# Patient Record
Sex: Female | Born: 1950
Health system: Southern US, Community
[De-identification: ages and names within clinical notes are randomized; demographics above are authoritative.]

## PROBLEM LIST (undated history)

## (undated) DIAGNOSIS — K5792 Diverticulitis of intestine, part unspecified, without perforation or abscess without bleeding: Secondary | ICD-10-CM

## (undated) DIAGNOSIS — Z720 Tobacco use: Secondary | ICD-10-CM

## (undated) DIAGNOSIS — I10 Essential (primary) hypertension: Secondary | ICD-10-CM

## (undated) DIAGNOSIS — K219 Gastro-esophageal reflux disease without esophagitis: Secondary | ICD-10-CM

## (undated) DIAGNOSIS — K759 Inflammatory liver disease, unspecified: Secondary | ICD-10-CM

## (undated) DIAGNOSIS — G43909 Migraine, unspecified, not intractable, without status migrainosus: Secondary | ICD-10-CM

## (undated) DIAGNOSIS — I739 Peripheral vascular disease, unspecified: Secondary | ICD-10-CM

## (undated) DIAGNOSIS — M47812 Spondylosis without myelopathy or radiculopathy, cervical region: Secondary | ICD-10-CM

## (undated) DIAGNOSIS — I679 Cerebrovascular disease, unspecified: Secondary | ICD-10-CM

## (undated) DIAGNOSIS — I2510 Atherosclerotic heart disease of native coronary artery without angina pectoris: Secondary | ICD-10-CM

## (undated) DIAGNOSIS — A048 Other specified bacterial intestinal infections: Secondary | ICD-10-CM

## (undated) DIAGNOSIS — B029 Zoster without complications: Secondary | ICD-10-CM

## (undated) HISTORY — DX: Migraine, unspecified, not intractable, without status migrainosus: G43.909

## (undated) HISTORY — PX: CAROTID ENDARTERECTOMY: SUR193

## (undated) HISTORY — DX: Peripheral vascular disease, unspecified: I73.9

## (undated) HISTORY — DX: Gastro-esophageal reflux disease without esophagitis: K21.9

## (undated) HISTORY — PX: ABDOMINAL HYSTERECTOMY: SHX81

## (undated) HISTORY — PX: HEMORRHOID SURGERY: SHX153

## (undated) HISTORY — DX: Other specified bacterial intestinal infections: A04.8

## (undated) HISTORY — DX: Inflammatory liver disease, unspecified: K75.9

## (undated) HISTORY — DX: Essential (primary) hypertension: I10

## (undated) HISTORY — DX: Zoster without complications: B02.9

## (undated) HISTORY — PX: CHOLECYSTECTOMY: SHX55

## (undated) HISTORY — DX: Cerebrovascular disease, unspecified: I67.9

## (undated) HISTORY — DX: Tobacco use: Z72.0

## (undated) HISTORY — PX: COLONOSCOPY: SHX174

## (undated) HISTORY — DX: Atherosclerotic heart disease of native coronary artery without angina pectoris: I25.10

## (undated) HISTORY — DX: Spondylosis without myelopathy or radiculopathy, cervical region: M47.812

## (undated) HISTORY — DX: Diverticulitis of intestine, part unspecified, without perforation or abscess without bleeding: K57.92

---

## 1998-11-30 ENCOUNTER — Ambulatory Visit (HOSPITAL_COMMUNITY): Admission: RE | Admit: 1998-11-30 | Discharge: 1998-12-01 | Payer: Self-pay | Admitting: *Deleted

## 1999-06-19 ENCOUNTER — Encounter: Admission: RE | Admit: 1999-06-19 | Discharge: 1999-06-19 | Payer: Self-pay | Admitting: Family Medicine

## 1999-06-19 ENCOUNTER — Encounter: Payer: Self-pay | Admitting: Family Medicine

## 2000-09-18 ENCOUNTER — Other Ambulatory Visit: Admission: RE | Admit: 2000-09-18 | Discharge: 2000-09-18 | Payer: Self-pay | Admitting: Obstetrics and Gynecology

## 2000-12-30 ENCOUNTER — Encounter (INDEPENDENT_AMBULATORY_CARE_PROVIDER_SITE_OTHER): Payer: Self-pay | Admitting: *Deleted

## 2000-12-30 ENCOUNTER — Ambulatory Visit (HOSPITAL_BASED_OUTPATIENT_CLINIC_OR_DEPARTMENT_OTHER): Admission: RE | Admit: 2000-12-30 | Discharge: 2000-12-30 | Payer: Self-pay | Admitting: *Deleted

## 2002-05-26 ENCOUNTER — Emergency Department (HOSPITAL_COMMUNITY): Admission: EM | Admit: 2002-05-26 | Discharge: 2002-05-26 | Payer: Self-pay | Admitting: *Deleted

## 2002-05-26 ENCOUNTER — Encounter: Payer: Self-pay | Admitting: *Deleted

## 2002-06-10 ENCOUNTER — Ambulatory Visit (HOSPITAL_COMMUNITY): Admission: RE | Admit: 2002-06-10 | Discharge: 2002-06-10 | Payer: Self-pay | Admitting: Family Medicine

## 2002-06-10 ENCOUNTER — Encounter: Payer: Self-pay | Admitting: Family Medicine

## 2003-02-21 ENCOUNTER — Ambulatory Visit (HOSPITAL_COMMUNITY): Admission: RE | Admit: 2003-02-21 | Discharge: 2003-02-21 | Payer: Self-pay | Admitting: Neurology

## 2003-03-29 ENCOUNTER — Encounter (INDEPENDENT_AMBULATORY_CARE_PROVIDER_SITE_OTHER): Payer: Self-pay | Admitting: Specialist

## 2003-03-29 ENCOUNTER — Inpatient Hospital Stay (HOSPITAL_COMMUNITY): Admission: RE | Admit: 2003-03-29 | Discharge: 2003-03-30 | Payer: Self-pay | Admitting: Vascular Surgery

## 2004-02-04 HISTORY — PX: CORONARY ARTERY BYPASS GRAFT: SHX141

## 2004-06-03 ENCOUNTER — Inpatient Hospital Stay (HOSPITAL_COMMUNITY)
Admission: AD | Admit: 2004-06-03 | Discharge: 2004-06-08 | Payer: Self-pay | Admitting: Thoracic Surgery (Cardiothoracic Vascular Surgery)

## 2004-06-04 ENCOUNTER — Encounter: Payer: Self-pay | Admitting: Cardiovascular Disease

## 2004-06-27 ENCOUNTER — Encounter
Admission: RE | Admit: 2004-06-27 | Discharge: 2004-06-27 | Payer: Self-pay | Admitting: Thoracic Surgery (Cardiothoracic Vascular Surgery)

## 2004-11-21 ENCOUNTER — Inpatient Hospital Stay (HOSPITAL_COMMUNITY): Admission: AD | Admit: 2004-11-21 | Discharge: 2004-11-22 | Payer: Self-pay | Admitting: Cardiovascular Disease

## 2008-11-21 ENCOUNTER — Encounter: Payer: Self-pay | Admitting: Gastroenterology

## 2008-11-24 ENCOUNTER — Encounter: Payer: Self-pay | Admitting: Gastroenterology

## 2009-03-21 ENCOUNTER — Ambulatory Visit: Payer: Self-pay | Admitting: Gastroenterology

## 2009-03-22 ENCOUNTER — Ambulatory Visit: Payer: Self-pay | Admitting: Gastroenterology

## 2009-03-26 ENCOUNTER — Encounter: Payer: Self-pay | Admitting: Gastroenterology

## 2009-04-04 ENCOUNTER — Telehealth: Payer: Self-pay | Admitting: Gastroenterology

## 2009-10-18 ENCOUNTER — Ambulatory Visit: Payer: Self-pay | Admitting: Cardiology

## 2009-10-18 ENCOUNTER — Encounter (INDEPENDENT_AMBULATORY_CARE_PROVIDER_SITE_OTHER): Payer: Self-pay | Admitting: *Deleted

## 2009-10-18 DIAGNOSIS — I252 Old myocardial infarction: Secondary | ICD-10-CM

## 2009-10-18 LAB — CONVERTED CEMR LAB
AST: 14 units/L
BUN: 23 mg/dL
Creatinine, Ser: 1 mg/dL
GFR calc non Af Amer: >60 mL/min
Glomerular Filtration Rate, Af Am: 57 mL/min/{1.73_m2}
HCT: 41.3 %
Platelets: 264 10*3/uL
Potassium: 4.1 meq/L
Sodium: 140 meq/L

## 2009-10-19 ENCOUNTER — Encounter: Payer: Self-pay | Admitting: Cardiology

## 2009-10-19 DIAGNOSIS — F17200 Nicotine dependence, unspecified, uncomplicated: Secondary | ICD-10-CM | POA: Insufficient documentation

## 2009-10-19 DIAGNOSIS — E785 Hyperlipidemia, unspecified: Secondary | ICD-10-CM | POA: Insufficient documentation

## 2009-10-19 DIAGNOSIS — K219 Gastro-esophageal reflux disease without esophagitis: Secondary | ICD-10-CM

## 2009-10-19 DIAGNOSIS — K5730 Diverticulosis of large intestine without perforation or abscess without bleeding: Secondary | ICD-10-CM | POA: Insufficient documentation

## 2009-10-24 ENCOUNTER — Encounter: Payer: Self-pay | Admitting: Cardiology

## 2009-11-30 DIAGNOSIS — I739 Peripheral vascular disease, unspecified: Secondary | ICD-10-CM

## 2009-11-30 DIAGNOSIS — I679 Cerebrovascular disease, unspecified: Secondary | ICD-10-CM

## 2009-12-04 ENCOUNTER — Encounter: Payer: Self-pay | Admitting: Cardiology

## 2010-01-03 ENCOUNTER — Encounter: Admission: RE | Admit: 2010-01-03 | Discharge: 2010-01-03 | Payer: Self-pay | Admitting: Interventional Cardiology

## 2010-02-14 ENCOUNTER — Ambulatory Visit (HOSPITAL_COMMUNITY)
Admission: RE | Admit: 2010-02-14 | Discharge: 2010-02-14 | Payer: Self-pay | Source: Home / Self Care | Attending: Interventional Cardiology | Admitting: Interventional Cardiology

## 2010-02-24 ENCOUNTER — Encounter: Payer: Self-pay | Admitting: Thoracic Surgery (Cardiothoracic Vascular Surgery)

## 2010-02-24 ENCOUNTER — Encounter: Payer: Self-pay | Admitting: Family Medicine

## 2010-03-05 ENCOUNTER — Ambulatory Visit
Admission: RE | Admit: 2010-03-05 | Discharge: 2010-03-05 | Payer: Self-pay | Source: Home / Self Care | Attending: Vascular Surgery | Admitting: Vascular Surgery

## 2010-03-06 NOTE — Consult Note (Addendum)
NEW PATIENT CONSULTATION  [name], [name] DOB:  28-Nov-1950                                       03/05/2010 [id number]#:[id number]  This patient presents today for evaluation of right leg claudication. She is a 59-year white female with extensive past history of a cardiac and cerebrovascular occlusive disease.  She is status post coronary bypass grafting in 2006 and underwent a right carotid endarterectomy in approximately 2004.  She has had progressive bilateral lower extremity claudication symptoms and underwent CT angiogram on 01/03/2010 followed by a formal arteriogram by Dr. Eldridge Dace to follow.  I do have these films for review.  The arteriogram revealed a significant stenosis in the left common iliac artery and she had successful stenting of this by Dr. Eldridge Dace.  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 The dominant run-off vessel was the peroneal artery on the right.  She has some knee discomfort with walking as well but the majority of symptoms appear to be related to the right calf claudication.  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  She reports this very difficult for her to do her activities at work due to the calf pain.  PAST HISTORY:  Significant hypertension, elevated cholesterol.  She has a history of cholecystectomy and hysterectomy.  SOCIAL HISTORY:  She is single with no children.  She has dropped down to the approximately 4 cigarettes per day from a heavy habit in the past.  She does not drink alcohol.  FAMILY HISTORY:  Significant for myocardial infarction in her father at age 31 and TIA in her sister.  REVIEW OF SYSTEMS:  Positive for weight gain, she is up to 222 pounds. She is 5 feet 9 inches. VASCULAR:  Pain in the legs with walking. CARDIAC:  Positive for chest pain  in the past. GI:  Positive for reflux. NEUROLOGIC:  Positive for headaches. PULMONARY, HEMATOLOGIC, GU:  Negative. HEENT:  Sore throat. MUSCULOSKELETAL:  Joint pain and muscle pain. PSYCHIATRIC:  Anxiety.  PHYSICAL EXAMINATION:  Well-developed, moderately obese white female in no acute distress.  Blood pressure 171/92, pulse 98, respirations 22. HEENT:  Normal.  Chest:  Clear bilaterally without  rales, rhonchi or wheezes.  Heart:  Regular rate and rhythm.  I do not hear any murmurs. She has 2+ radial, 2+ femoral pulses bilaterally.  She has a faint left popliteal pulse.  I do not palpate pedal pulses on the left.  On the right, she has absent popliteal and distal pulses.  Musculoskeletal: She has no major deformity or cyanosis.  Neurologic:  No focal weakness or paresthesias.  Skin:  Without ulcers or rashes.  Carotid arteries without bruits.  She does have a well-healed right neck incision.  I have reviewed her films with the patient.  I explained that she has had the prior vein harvest from her right leg and apparently had an exploration of the left vein.  It was felt not to be adequate for bypass.  I have explained that her current situation is not limb threatening and explained one option would be continued observation. She reports that she is unable to tolerate this degree of claudication and it is making it very difficult for her to do her  job.  I discussed the option of right femoral to popliteal bypass for relief of her ischemic symptoms.  I explained that this would be done with Gore-Tex prosthetic graft which should have excellent patency in the above-knee position approaching that of vein at 70% 5-year patency.  She understands the procedure and expected out of work time following this. She wishes to proceed.  We have scheduled this at her convenience for 03/15/2010 at [facility]    Larina Earthly, M.D. Electronically Signed  TFE/MEDQ  D:  03/05/2010  T:   03/06/2010  Job:  [id number]  cc:   Lyn Records, M.D. Corky Crafts, MD

## 2010-03-07 NOTE — Procedures (Signed)
Summary: Upper Endoscopy  Patient: [name] Note: All result statuses are Final unless otherwise noted.  Tests: (1) Upper Endoscopy (EGD)   EGD Upper Endoscopy       DONE     [facility]     [address]     [place], [place]  [address]           ENDOSCOPY PROCEDURE REPORT           PATIENT:  Misty, Yu  MR#:  [id number]     BIRTHDATE:  1950-10-24, 58 yrs. old  GENDER:  female           ENDOSCOPIST:  Barbette Hair. Arlyce Dice, MD     Referred by:           PROCEDURE DATE:  03/22/2009     PROCEDURE:  EGD, diagnostic     ASA CLASS:  Class II     INDICATIONS:  GERD           MEDICATIONS:   There was residual sedation effect present from     prior procedure., Versed 1 mg IV, glycopyrrolate (Robinal) 0.2 mg     IV, 0.6cc simethancone 0.6 cc PO     TOPICAL ANESTHETIC:  Exactacain Spray           DESCRIPTION OF PROCEDURE:   After the risks benefits and     alternatives of the procedure were thoroughly explained, informed     consent was obtained.  The LB GIF-H180 [id number] endoscope was     introduced through the mouth and advanced to the third portion of     the duodenum, without limitations.  The instrument was slowly     withdrawn as the mucosa was fully examined.     <<PROCEDUREIMAGES>>           The upper, middle, and distal third of the esophagus were     carefully inspected and no abnormalities were noted. The z-line     was well seen at the GEJ. The endoscope was pushed into the fundus     which was normal including a retroflexed view. The antrum,gastric     body, first and second part of the duodenum were unremarkable (see     image1, image2, and image3).    Retroflexed views revealed no     abnormalities.    The scope was then withdrawn from the patient     and the procedure completed.           COMPLICATIONS:  None           ENDOSCOPIC IMPRESSION:     1) Normal EGD     RECOMMENDATIONS:     1) continue PPI - dexilant 60mg  daily     2) Call office next 2-3 days  to schedule an office appointment     for 1 month           REPEAT EXAM:  No           ______________________________     Barbette Hair. Arlyce Dice, MD           CC:  Patrica Duel, MD           n.     Rosalie Doctor:   Barbette Hair. Diar Berkel at 03/22/2009 03:06 PM           Lindie Spruce, [phone]  Note: An exclamation mark (!) indicates a result that was not dispersed into the flowsheet. Document Creation Date: 03/22/2009 3:06  PM _______________________________________________________________________  (1) Order result status: Final Collection or observation date-time: 03/22/2009 14:57 Requested date-time:  Receipt date-time:  Reported date-time:  Referring Physician:   Ordering Physician: Melvia Heaps [phone]) Specimen Source:  Source: Launa Grill Order Number: [id number] Lab site:

## 2010-03-07 NOTE — Assessment & Plan Note (Signed)
Summary: + hpylori, severe nausea...em   History of Present Illness Visit Type: Initial Consult Primary GI MD: Melvia Heaps MD [facility] Primary Provider: Earma Reading, PA Requesting Provider: Patrica Duel, MD Chief Complaint: Patient complains of nausea and is pos for H pylori. She currently has three days of treatment left for her h pylori. She is having some diarrhea and rectal bleeding which she comtributes to her hemorrhoids.  History of Present Illness:   [name] is a 60 year old white female referred at the request of Dr. Nobie Putnam for evaluation of nausea.  Nausea has been a problem for years.  It is intermittent and may occur in waves.  It is occasionally worsened with eating.  She has a long history of severe pyrosis.  She often has to break through symptoms despite taking Prilosec once or even twice a day.  She denies dysphagia, coughing or hoarseness.   She has been taking Prilosec for years.  She was recently placed on a Prevpac because of blood work showing H pylori antibody.  She recently underwent a CT of the abdomen and pelvis fall undergoing go for hematuria.  and indeterminant 13 mm ovoid density was seen in the liver.  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   GI Review of Systems    Reports abdominal pain, acid reflux, belching, bloating, heartburn, loss of appetite, nausea, vomiting, and  weight loss.     Location of  Abdominal pain: epigastric area. Weight loss of 7 pounds over 5 days.   Denies chest pain, dysphagia with liquids, dysphagia with solids, vomiting blood, and  weight gain.      Reports diarrhea, hemorrhoids, and  rectal bleeding.     Denies anal fissure, black tarry stools, change in bowel habit, constipation, diverticulosis, fecal incontinence, heme positive stool, irritable bowel syndrome, jaundice, light color stool, liver problems, and   rectal pain. Preventive Screening-Counseling & Management  Alcohol-Tobacco     Smoking Status: current      Drug Use:  no.      Current Medications (verified): 1)  Tribenzor (Unknown Dose) .... Take One By Mouth Once Daily 2)  Aspirin 325 Mg Tabs (Aspirin) .... Takw Two By Mouth Once Daily 3)  Prilosec Otc 20 Mg Tbec (Omeprazole Magnesium) .... Take One- Two Tabs By Mouth Once Daily 4)  Multivitamins  Tabs (Multiple Vitamin) .... Take One By Mouth Once Daily 5)  Coenzyme Q10 100 Mg Caps (Coenzyme Q10) .... Take One By Mouth Once Daily 6)  Prevpac  Misc (Amoxicill-Clarithro-Lansopraz) .... Take As Directed 7)  Potassium .... Take One  By Mouth Once Daily  Allergies (verified): 1)  ! Codeine  Past History:  Past Medical History: Hypertension h pylori GERD Coronary Artery Disease hx hep unknown type  Past Surgical History: open heart surgery Cholecystectomy Hysterectomy Hemorrhoidectomy right Carotid surgery  Family History: Family History of Breast Cancer:maternal grandmother Family History of Colon Cancer:maternal great aunts rectal? Family History of Diabetes: maternal uncle Family History of Heart Disease: maternal uncle, fathers side of family  Social History: Patient currently 4 per day Alcohol Use - no Daily Caffeine Use occ Illicit Drug Use - no Smoking Status:  current Drug Use:  no  Review of Systems       The patient complains of arthritis/joint pain, back pain, fatigue, headaches-new, muscle pains/cramps, sleeping problems, thirst - excessive, and urination - excessive.  The patient  denies allergy/sinus, anemia, anxiety-new, blood in urine, breast changes/lumps, change in vision, confusion, cough, coughing up blood, depression-new, fainting, fever, hearing problems, heart murmur, heart rhythm changes, itching, menstrual pain, night sweats, nosebleeds, pregnancy symptoms, shortness of breath, skin rash, sore throat, swelling of feet/legs, swollen lymph  glands, thirst - excessive , urination - excessive , urination changes/pain, urine leakage, vision changes, and voice change.         All other systems were reviewed and were negative   Vital Signs:  Patient profile:   60 year old female Height:      69 inches Weight:      212.2 pounds BMI:     31.45 Pulse rate:   92 / minute Pulse rhythm:   regular BP sitting:   142 / 90  (left arm) Cuff size:   regular  Vitals Entered By: Harlow Mares CMA Duncan Dull) (March 21, 2009 11:39 AM)  Physical Exam  Additional Exam:  On physical exam she is a healthy-appearing female  skin: anicteric HEENT: normocephalic; PEERLA; no nasal or pharyngeal abnormalities neck: supple nodes: no cervical lymphadenopathy chest: clear to ausculatation and percussion heart: no murmurs, gallops, or rubs abd: soft, nontender; BS normoactive; no abdominal masses,  organomegaly; there is very mild tenderness to palpation in the midepigastrium rectal: deferred ext: no cynanosis, clubbing, edema skeletal: no deformities neuro: oriented x 3; no focal abnormalities    Impression & Recommendations:  Problem # 1:  NAUSEA ALONE (ICD-787.02)  Nausea could be a reflection of her acid reflux.  Prilosec could be causing symptoms as well.  Gastroparesis is less likely.  The patient is #1 upper endoscopy #2 switch from Prilosec to DEXILANT  Orders: Colon/Endo (Colon/Endo)  Problem # 2:  ESOPHAGEAL REFLUX (ICD-530.81)  Plan trial of DEXILANT for better control symptoms.  Endoscopy will be scheduled to rule out Barrett's esophagus.  Risks, alternatives, and complications of the procedure, including bleeding, perforation, and possible need for surgery, were explained to the patient.  Patient's questions were answered.  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  Further delineation was  recommended.  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  Patient Instructions: 1)  Colonoscopy and Flexible Sigmoidoscopy brochure given.  2)  Conscious Sedation brochure given.  3)  Upper Endoscopy brochure given.  4)  Your Colon/Endo is scheduled for 03/22/2009 at 2pm 5)  You can pick up your MoviPrep from your pharmacy today 6)  CC Dr. Patrica Duel 7)  The medication list was reviewed and reconciled.  All changed / newly prescribed medications were explained.  A complete medication list was provided to the patient / caregiver. Prescriptions: MOVIPREP 100 GM  SOLR (PEG-KCL-NACL-NASULF-NA ASC-C) As per prep instructions.  #1 x 0   Entered by:   Merri Ray CMA (AAMA)   Authorized by:   Louis Meckel MD   Signed by:   Louis Meckel MD on 03/21/2009   Method used:   Electronically to        [facility] [phone] (retail)       [address] [place], [place]  [address]       Ph: [phone]  Fax: [phone]   RxID:   [id number]

## 2010-03-07 NOTE — Letter (Signed)
Summary: Out of Work  [facility]  [address] [place], [place] [address]   Phone: [phone]  Fax: [phone]    03/21/2009  TO: WHOM IT MAY CONCERN  RE: [name] [address] [id number]       The above named individual is currently under my care and will be out of work    FROM: 03/21/2009   THROUGH:03/22/2009    REASON: procedure    MAY RETURN ON: 03/23/2009     If you have any further questions or need additional information, please call.     Sincerely,    Anesha Hackert,MD  typed by: Merri Ray CMA (AAMA)

## 2010-03-07 NOTE — Letter (Signed)
Summary: [facility] Work [employer], agricultural at [employer]  [address] [place] [address]   Phone: [phone]  Fax: [phone]     October 18, 2009 MRN: [id number]   [facility] [address] [place], [place]  [address]      YOUR LAB WORK IS DUE   November 12, 2009  Please go to [facility], located across the street from [facility] on the second floor.  Hours are Monday - Friday 7am until 7:30pm         Saturday 8am until 12noon    _X_  DO NOT EAT OR DRINK AFTER MIDNIGHT EVENING PRIOR TO LABWORK  __ YOUR LABWORK IS NOT FASTING --YOU MAY EAT PRIOR TO LABWORK

## 2010-03-07 NOTE — Assessment & Plan Note (Signed)
Summary: ***per Dr.Fusco for cardiac eval/tg   Visit Type:  Initial Consult Referring Provider:  Patrica Duel, MD Primary Provider:  Earma Reading, PA   History of Present Illness: Ms. [name] is seen at the kind request of Dr. Sherwood Gambler for initial cardiology evaluation and continuing care of coronary artery disease and cardiovascular risk factors.  This nice woman was previously a patient of Dr. Elsie Lincoln at [facility] cardiology, but now elects to transfer her care to [facility].  Her records have been requested, but not yet provided.  She required coronary stenting and subsequent CABG surgery in 2006.  She also has a history of renal artery stenting.  She has cerebrovascular disease, having undergone right carotid endarterectomy.  Finally, she also appears to have peripheral vascular disease involving the lower extremities, but this is somewhat uncertain based upon her history.  Cardiovascular risk factors include tobacco use, which continues at one half pack of cigarettes per day and hyperlipidemia.  She has not been seen by a cardiologist for the past 3 years.  Patient's principal complaint at present is of exertional leg fatigue.  There is some associated discomfort.  When she rests for a number of minutes, she can resume activity without difficulty.  This is impairing her mobility and work.  She appears to describe previous obstruction by angiography, but with further questioning, she is uncertain whether her peripheral circulation has been tested in the past.  Current Medications (verified): 1)  Tribenzor 40-5-25 Mg Tabs (Olmesartan-Amlodipine-Hctz) .... Take 1 Tab Daily 2)  Aspirin 81 Mg Tbec (Aspirin) .... Take One Tablet By Mouth Daily 3)  Prilosec Otc 20 Mg Tbec (Omeprazole Magnesium) .... Take One- Two Tabs By Mouth Once Daily 4)  Multivitamins  Tabs (Multiple Vitamin) .... Take One By Mouth Once Daily 5)  Coenzyme Q10 100 Mg Caps (Coenzyme Q10) .... Take One By Mouth Once Daily 6)   Potassium 99 Mg Tabs (Potassium) .... Take 1 Tab Daily 7)  Th Flax Seed Oil 1000 Mg Caps (Flaxseed (Linseed)) .... Take 1 Tab Daily 8)  Ambien 10 Mg Tabs (Zolpidem Tartrate) .... Take As Needed 9)  Zofran 4 Mg Tabs (Ondansetron Hcl) .... Take As Needed 10)  Metoprolol Succinate 50 Mg Xr24h-Tab (Metoprolol Succinate) .... Take One Tablet By Mouth Daily 11)  Pravastatin Sodium 80 Mg Tabs (Pravastatin Sodium) .... Take One Tablet By Mouth Daily At Bedtime  Allergies (verified): 1)  ! Codeine  Past History:  Family History: Last updated: 10/18/2009 Family History of Breast Cancer:maternal grandmother Family History of Colon Cancer:maternal great aunts rectal? 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 pylori Hepatitis Diverticulitis  Past Surgical History: CABG Right carotid endarterectomy Cholecystectomy Hysterectomy Hemorrhoidectomy Colonoscopy  EKG  Procedure date:  10/18/2009  Findings:      Normal sinus rhythm Left atrial abnormality Right ventricular conduction delay Delayed R-wave progression-cannot exclude previous septal MI Mild inferior  ST segment depression Rightward axis No previous tracings for comparison   Family History: Family History of Breast  Cancer:maternal grandmother Family History of Colon Cancer:maternal great aunts rectal? 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  Vital Signs:  Patient profile:   60 year old female Weight:      218 pounds Pulse rate:   86 / minute BP sitting:   176 / 95  (right arm)  Vitals Entered By: Dreama Saa, CNA (October 18, 2009 1:55 PM)  Physical Exam  General:  Moderately obese; well-developed; no acute distress: HEENT-[place]/AT; PERRL; EOM intact; conjunctiva and lids nl:  Neck-No JVD; no carotid bruits;right carotid endarterectomy scar Endocrine-No thyromegaly: Lungs-No tachypnea, clear without rales, rhonchi or wheezes; decreased breath sounds to bases CV-normal PMI; normal S1 and S2:;  Abdomen-BS normal; soft and non-tender without masses or organomegaly: MS-No deformities, cyanosis or clubbing: Neurologic-Nl cranial nerves; symmetric strength and tone: Skin- Warm, no sig. lesions: Extremities-1+ distal pulses; no edema    Impression & Recommendations:  Problem # 1:  ATHEROSCLEROTIC CARDIOVASCULAR DISEASE (ICD-429.2) Patient is stable symptomatically with respect to coronary disease.  Dose of aspirin will be reduced to 81 mg q.d.  Problem # 2:  HYPERLIPIDEMIA (ICD-272.4) No  lipid profile results are available, but patient almost certainly requires treatment with a statin.  Pravastatin 80 mg q.d.Marland Kitchen will be added with a lipid profile to be obtained at baseline and in one month.  Problem # 3:  HYPERTENSION (ICD-401.1) Blood pressure control is suboptimal; metoprolol will be started and blood pressure checked by the patient at home and by [place] in one month.  Prior records will be obtained and reviewed at her followup visit.  Problem # 4:  R/O CLAUDICATION (ICD-443.9) ABIs and duplex study of the arteries of the lower extremities to be obtained.  Other Orders: Future Orders: T-Lipid Profile ([id number]) ... 11/12/2009 T-Basic Metabolic Panel [id number]) ... 11/12/2009  Patient Instructions: 1)  Your physician recommends that you schedule a follow-up appointment in: 3-4 weeks 2)  Your physician recommends that you return for lab work in: next week 3)  Your physician has recommended you make the following change in your medication: decrease aspirin to 81mg  daily, start pravastatin 80mg  daily, metoprolol succinate 50mg  daily 4)  Your physician discussed the hazards of tobacco use.  Tobacco use cessation is recommended and techniques and options to help you quit were discussed. Prescriptions: PRAVASTATIN SODIUM 80 MG TABS (PRAVASTATIN SODIUM) Take one tablet by mouth daily at bedtime  #30 x 3   Entered by:   Teressa Lower RN   Authorized by:   Kathlen Brunswick, MD, [facility]   Signed by:   Teressa Lower RN on 10/18/2009   Method used:   Electronically to        [facility] [place] [id number] (retail)       [phone]  [address] [place], [place]  [address]       Ph: [phone]       Fax: [phone]   RxID:   [id number] METOPROLOL SUCCINATE 50 MG XR24H-TAB (METOPROLOL SUCCINATE) Take one tablet by mouth daily  #30 x 3   Entered by:   Teressa Lower RN   Authorized by:   Kathlen Brunswick, MD, [facility]   Signed by:   Teressa Lower RN on 10/18/2009   Method used:    Electronically to        [facility] [id number] (retail)       [address] [place], [place]  [address]       Ph: [phone]       Fax: [phone]   RxID:   [id number]   Appended Document: Review of [facility] medical records    Clinical Lists Changes  Problems: Changed problem from Rule out  CLAUDICATION (ICD-443.9) to PERIPHERAL VASCULAR DISEASE (ICD-443.9) Changed problem from ATHEROSCLEROSIS, RENAL ARTERY (ICD-440.1) to RENAL ARTERY STENOSIS-RIGHT (ICD-440.1) Changed problem from HYPERTENSION (ICD-401.1) to HYPERTENSION-SEVERE (ICD-401.1) Changed problem from CEREBROVASCULAR DISEASE-RIGHT CEA (ICD-437.9) to CEREBROVASCULAR DISEASE-RIGHT CEA (ICD-437.9) Allergies: Added new allergy or adverse reaction of MORPHINE Added new allergy or adverse reaction of * CONTRAST DYE Added new allergy or adverse reaction of * MECLIZINE Observations: Added new observation of SOCIAL HX: Tobacco-40 pack years; 1/2 pack per day since 2005 Alcohol Use - no Illicit Drug Use - no 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 pylori Hepatitis Diverticulitis Degenerative joint disease-cervical spine Migraine headaches   (11/30/2009 18:04) Added new observation of CARDIO HPI: Office records of [facility] from 2006  Patient first seen in 03/2004 for multiple cardiovascular risk factors including hypertension and hyperlipidemia.  Her right carotid endarterectomy  performed in 2005 by Dr. Hart Rochester.  Extensive history of tobacco use.  Positive family history for coronary disease.  Abnormal stress nuclear study left cardiac catheterization that revealed significant left main disease, significant ostial circumflex stenosis and an 80% mid to distal RCA.  Peripheral vascular disease also present with a 70% right renal artery stenosis and a high-grade proximal subclavian lesion as well as an 80% right common iliac.  Stent placed in the left subclavian and right common iliac in 06/2004.  Uncomplicated CABG surgery performed shortly thereafter.  Right renal artery stented in 11/2004.   (11/30/2009 18:04) Added new observation of REFERRING MD: . (11/30/2009 18:04) Added new observation of PRIMARY MD: Earma Reading, PA-[facility] (11/30/2009 18:04) Added new observation of CXR RESULTS: Prior CABG surgery No cardiomegaly Clear lung fields No bony abnormality (11/21/2004 18:19) Added new observation of TRIGLYCERIDE: 229 mg/dL (88/41/6606 30:16) Added new observation of HDL: 62 mg/dL (02/11/3233 57:32) Added new observation of LDL DIR: 88 mg/dL (20/25/4270 62:37) Added new observation of CHOLESTEROL: 203 mg/dL (62/83/1517 61:60) Added new observation of CARDCATHFIND: LV: 160/18; EDP of 26; normal ejection fraction Left main-60% ostial lesion with catheter damping CX-85% ostial stenosis RCA 75% mid stenosis LAD-small vessel with luminal  irregularities Right renal artery-75% Left subclavian-75% Right common iliac-75%  (04/30/2004 19:08) Added new observation of ECHOINTERP: Performed by [facility]  Left ventricle-normal size; borderline LVH; impaired LV relaxation; normal EF. LA-mildly dilated RV-normal size and function No significant valvular abnormalities; mild MR Small pericardial effusion  (04/17/2004 18:28) Added new observation of NUCST CONC: Pharmacologic Stress Nuclear Study-Performed by [facility]  Mild to moderate lateral and  inferolateral ischemia Normal LV systolic function  (04/08/2004 18:30)       Referring Provider:  . Primary Provider:  Earma Reading, PA-[facility]   History of Present Illness: Office records of [facility] from 2006  Patient first seen in 03/2004 for multiple cardiovascular risk factors including hypertension and hyperlipidemia.  Her right carotid endarterectomy performed in 2005 by Dr. Hart Rochester.  Extensive history of tobacco use.  Positive family history for coronary disease.  Abnormal stress nuclear study left cardiac catheterization that revealed significant left main disease, significant ostial circumflex stenosis and an 80% mid to distal RCA.  Peripheral vascular disease also present with a 70% right renal artery stenosis and a high-grade proximal subclavian lesion as well as an 80% right common iliac.  Stent placed in the left subclavian and right common iliac in 06/2004.  Uncomplicated CABG surgery performed shortly thereafter.  Right renal artery stented in 11/2004.     CXR  Procedure date:  11/21/2004  Findings:      Prior CABG surgery No cardiomegaly Clear lung fields No bony abnormality  Echocardiogram  Procedure date:  04/17/2004  Findings:      Performed by [facility]  Left ventricle-normal size; borderline LVH; impaired LV relaxation; normal EF. LA-mildly dilated RV-normal size and function No significant valvular abnormalities; mild MR Small pericardial effusion  Nuclear ETT  Procedure date:  04/08/2004  Findings:      Pharmacologic Stress Nuclear Study-Performed by [facility]  Mild to moderate lateral and inferolateral ischemia Normal LV systolic function  Cardiac Cath  Procedure date:  04/30/2004  Findings:      LV: 160/18; EDP of 26; normal ejection fraction Left main-60% ostial lesion with catheter damping CX-85% ostial stenosis RCA 75% mid stenosis LAD-small vessel with luminal irregularities Right  renal artery-75% Left subclavian-75% Right common iliac-75%  -  Date:  08/14/2004    Cholesterol: 203    LDL-direct: 88    HDL: 62    Triglycerides: 161   Past History:  Past Medical History: ASCVD-CABG surgery in 2006 with RIMA to the RCA, LIMA to the LAD and an SVG to the circumflex PVD-status post stent placement in left subclavian and right common iliac in 06/2008; right renal artery-11/2004 Cerebrovascular disease-status post right carotid endarterectomy-2005 Hypertension Tobacco abuse-40 pack years; continuing at one half pack per day Gastroesophageal reflux disease with a history of +H. 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

## 2010-03-07 NOTE — Progress Notes (Signed)
Summary: TRIAGE-Surgical appt.  ---- Converted from flag ---- ---- 04/04/2009 8:25 AM, Louis Meckel MD wrote: PLease ask pt to see Dr. 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  Send copy of path report (2002) to Dr. Purnell Shoemaker. ------------------------------  Phone Note Outgoing Call   Call placed by: Laureen Ochs LPN,  April 04, 2009 8:27 AM Call placed to: Patient Summary of Call: Message left for patient to callback.  Initial call taken by: Laureen Ochs LPN,  April 04, 2009 8:28 AM  Follow-up for Phone Call        Message left for patient to callback. Laureen Ochs LPN  April 06, 1608 8:55 AM  Message left for patient to callback. Laureen Ochs LPN  April 12, 9602 9:07 AM   Above MD orders reviewed with patient. She wants to see Dr.Hardcastle, who did her surgery in the past. Pt. will make her own appt, she needs to check her work schedule. She will let me know when her appt. is scheduled so I can fax records for appt. Pt. instructed to call back as needed.  Follow-up by: Laureen Ochs LPN,  April 12, 2009 8:54 AM  Additional Follow-up for Phone Call Additional follow up Details #1::        Pt. states she has scheduled an appt with Dr.Rosenbower for 05-03-09 at 9:20am. She states Dr.Hardcastle isn't at [facility] any longer. I will fax records for the appt. Pt. instructed to call back as needed.  Additional Follow-up by: Laureen Ochs LPN,  April 12, 2009 1:34 PM

## 2010-03-07 NOTE — Procedures (Signed)
Summary: Colonoscopy  Patient: [name] Note: All result statuses are Final unless otherwise noted.  Tests: (1) Colonoscopy (COL)   COL Colonoscopy           DONE     [facility]     [address]     [place], [place]  [address]           COLONOSCOPY PROCEDURE REPORT           PATIENT:  [name], [name]  MR#:  [id number]     BIRTHDATE:  [birth year], 58 yrs. old  GENDER:  female           ENDOSCOPIST:  Barbette Hair. Arlyce Dice, MD     Referred by:           PROCEDURE DATE:  03/22/2009     PROCEDURE:  Colonoscopy with snare polypectomy     ASA CLASS:  Class II     INDICATIONS:  Routine Risk Screening           MEDICATIONS:   Fentanyl 100 mcg IV, Versed 10 mg IV, Benadryl 50     mg IV           DESCRIPTION OF PROCEDURE:   After the risks benefits and     alternatives of the procedure were thoroughly explained, informed     consent was obtained.  Digital rectal exam was performed and     revealed perianal skin tags.   The LB [id number] endoscope     was introduced through the anus and advanced to the cecum, which     was identified by both the appendix and ileocecal valve, without     limitations.  The quality of the prep was good, using MoviPrep.     The instrument was then slowly withdrawn as the colon was fully     examined.     <<PROCEDUREIMAGES>>           FINDINGS:  A pedunculated polyp was found in the proximal     descending colon. It was 1 cm in size. Polyp was snared, then     cauterized with monopolar cautery. Retrieval was successful (see     image15). snare polyp  A pedunculated polyp was found in the     mid-descending colon. It was 12 mm in size. Polyp was snared, then     cauterized with monopolar cautery. Retrieval was successful (see     image16). snare polyp  Moderate diverticulosis was found sigmoid     to descending  Internal hemorrhoids were found (see image23).     This was otherwise a normal examination of the colon (see image1,     image2,  image7, image8, image9, image10, image11, image13, and     image22).   Retroflexed views in the rectum revealed no     abnormalities.    The scope was then withdrawn from the patient     and the procedure completed.           COMPLICATIONS:  None           ENDOSCOPIC IMPRESSION:     1) 1 cm pedunculated polyp in the descending colon     2) 12 mm pedunculated polyp in the descending colon     3) Moderate diverticulosis in the sigmoid to descending     4) Internal hemorrhoids     5) Otherwise normal examination     RECOMMENDATIONS:     1) colonoscopy in 5  years           REPEAT EXAM:  In 5 year(s) for Colonoscopy.           ______________________________     Barbette Hair. Arlyce Dice, MD           CC:  Patrica Duel, MD           n.     Rosalie Doctor:   Barbette Hair. Kaplan at 03/22/2009 02:52 PM           Page 2 of 3   [name], [name], [id number]  Note: An exclamation mark (!) indicates a result that was not dispersed into the flowsheet. Document Creation Date: 03/22/2009 2:52 PM _______________________________________________________________________  (1) Order result status: Final Collection or observation date-time: 03/22/2009 14:41 Requested date-time:  Receipt date-time:  Reported date-time:  Referring Physician:   Ordering Physician: Melvia Heaps [phone]) Specimen Source:  Source: Launa Grill Order Number: [id number] Lab site:   Appended Document: Colonoscopy     Procedures Next Due Date:    Colonoscopy: 04/2014

## 2010-03-07 NOTE — Letter (Signed)
Summary: [facility]  [facility]   Imported By: Sherian Rein 03/22/2009 11:17:09  _____________________________________________________________________  External Attachment:    Type:   Image     Comment:   External Document

## 2010-03-07 NOTE — Letter (Signed)
Summary: [place] [employer], agricultural at [employer]  [address] [place] [address]   Phone: [phone]  Fax: [phone]     October 18, 2009 MRN: [id number]   DAUNA ZISKA [address] [place], [place]  [address]      YOUR LAB WORK IS DUE   Monday, October 22, 2009  Please go to [facility], located across the street from [facility] on the second floor.  Hours are Monday - Friday 7am until 7:30pm         Saturday 8am until 12noon    _x_  DO NOT EAT OR DRINK AFTER MIDNIGHT EVENING PRIOR TO LABWORK  __ YOUR LABWORK IS NOT FASTING --YOU MAY EAT PRIOR TO LABWORK

## 2010-03-07 NOTE — Progress Notes (Signed)
Summary: [facility] OFFICE NOTE 03/20/09  [facility] OFFICE NOTE 03/20/09   Imported By: Faythe Ghee 10/24/2009 16:00:08  _____________________________________________________________________  External Attachment:    Type:   Image     Comment:   External Document

## 2010-03-07 NOTE — Letter (Signed)
Summary: [facility] Instructions  [facility]  [address] [place], [place] [address]   Phone: [phone]  Fax: [phone]       [name]    28-Jun-1950    MRN: [id number]        Procedure Day /Date:THURSDAY 03/22/2009     Arrival Time:1PM     Procedure Time:2PM     Location of Procedure:                    X   [facility] (4th Floor)                        PREPARATION FOR COLONOSCOPY WITH MOVIPREP   Starting 5 days prior to your procedure TODAY  do not eat nuts, seeds, popcorn, corn, beans, peas,  salads, or any raw vegetables.  Do not take any fiber supplements (e.g. Metamucil, Citrucel, and Benefiber).  THE DAY BEFORE YOUR PROCEDURE         DATE: 03/21/2009  DAY: WED  1.  Drink clear liquids the entire day-NO SOLID FOOD  2.  Do not drink anything colored red or purple.  Avoid juices with pulp.  No orange juice.  3.  Drink at least 64 oz. (8 glasses) of fluid/clear liquids during the day to prevent dehydration and help the prep work efficiently.  CLEAR LIQUIDS INCLUDE: Water Jello Ice Popsicles Tea (sugar ok, no milk/cream) Powdered fruit flavored drinks Coffee (sugar ok, no milk/cream) Gatorade Juice: apple, white grape, white cranberry  Lemonade Clear bullion, consomm, broth Carbonated beverages (any kind) Strained chicken noodle soup Hard Candy                             4.  In the morning, mix first dose of MoviPrep solution:    Empty 1 Pouch A and 1 Pouch B into the disposable container    Add lukewarm drinking water to the top line of the container. Mix to dissolve    Refrigerate (mixed solution should be used within 24 hrs)  5.  Begin drinking the prep at 5:00 p.m. The MoviPrep container is divided by 4 marks.   Every 15 minutes drink the solution down to the next mark (approximately 8 oz) until the full liter is complete.   6.  Follow completed prep with 16 oz of clear liquid of your choice (Nothing red or purple).   Continue to drink clear liquids until bedtime.  7.  Before going to bed, mix second dose of MoviPrep solution:    Empty 1 Pouch A and 1 Pouch B into the disposable container    Add lukewarm drinking water to the top line of the container. Mix to dissolve    Refrigerate  THE DAY OF YOUR PROCEDURE      DATE: 03/22/2009 DAY: THURSDAY  Beginning at 9a.m. (5 hours before procedure):         1. Every 15 minutes, drink the solution down to the next mark (approx 8 oz) until the full liter is complete.  2. Follow completed prep with 16 oz. of clear liquid of your choice.    3. You may drink clear liquids until 12:00PM(2 HOURS BEFORE PROCEDURE).   MEDICATION INSTRUCTIONS  Unless otherwise instructed, you should take regular prescription medications with a small sip of water   as early as possible the morning of your procedure.  OTHER INSTRUCTIONS  You will need a responsible adult at least 60 years of age to accompany you and drive you home.   This person must remain in the waiting room during your procedure.  Wear loose fitting clothing that is easily removed.  Leave jewelry and other valuables at home.  However, you may wish to bring a book to read or  an iPod/MP3 player to listen to music as you wait for your procedure to start.  Remove all body piercing jewelry and leave at home.  Total time from sign-in until discharge is approximately 2-3 hours.  You should go home directly after your procedure and rest.  You can resume normal activities the  day after your procedure.  The day of your procedure you should not:   Drive   Make legal decisions   Operate machinery   Drink alcohol   Return to work  You will receive specific instructions about eating, activities and medications before you leave.    The above instructions have been reviewed and explained to me by   _______________________    I fully understand and can verbalize these instructions  _____________________________ Date _________

## 2010-03-07 NOTE — Letter (Signed)
Summary: [facility] [employer]  [address] [place] [address]   Phone: [phone]  Fax: [phone]     October 18, 2009    [facility]   The above named patient had a medical visit today at:1:45  pm.  Please take this into consideration when reviewing the time away from work/school.      Sincerely yours,  Architectural technologist

## 2010-03-07 NOTE — Letter (Signed)
Summary: Patient Notice- Polyp Results  [facility]  [address] [place], [place] [address]   Phone: [phone]  Fax: [phone]        March 26, 2009 MRN: [id number]    [name] [address] [place], [place]  [address]    Dear [name],  I am pleased to inform you that the colon polyp(s) removed during your recent colonoscopy was (were) found to be benign (no cancer detected) upon pathologic examination.  I recommend you have a repeat colonoscopy examination in 5_ years to look for recurrent polyps, as having colon polyps increases your risk for having recurrent polyps or even colon cancer in the future.  Should you develop new or worsening symptoms of abdominal pain, bowel habit changes or bleeding from the rectum or bowels, please schedule an evaluation with either your primary care physician or with me.  Additional information/recommendations:  __ No further action with gastroenterology is needed at this time. Please      follow-up with your primary care physician for your other healthcare      needs.  __ Please call [phone] to schedule a return visit to review your      situation.  __ Please keep your follow-up visit as already scheduled.  _x_ Continue treatment plan as outlined the day of your exam.  Please call us if you are having persistent problems or have questions about your condition that have not been fully answered at this time.  Sincerely,  Louis Meckel MD  This letter has been electronically signed by your physician.  Appended Document: Patient Notice- Polyp Results Letter mailed 2.23.11

## 2010-03-07 NOTE — Letter (Signed)
Summary: [facility] RECORDS FROM 2006  [facility] RECORDS FROM 2006   Imported By: Faythe Ghee 12/04/2009 15:33:53  _____________________________________________________________________  External Attachment:    Type:   Image     Comment:   External Document

## 2010-03-07 NOTE — Letter (Signed)
Summary: Results Letter  [facility]  [address] [place], [place] [address]   Phone: [phone]  Fax: [phone]        March 21, 2009 MRN: [id number]    [name] [address] [place], [place]  [address]    Dear Ms. Wiedeman,  It is my pleasure to have treated you recently as a new patient in my office. I appreciate your confidence and the opportunity to participate in your care.  Since I do have a busy inpatient endoscopy schedule and office schedule, my office hours vary weekly. I am, however, available for emergency calls everyday through my office. If I am not available for an urgent office appointment, another one of our gastroenterologist will be able to assist you.  My well-trained staff are prepared to help you at all times. For emergencies after office hours, a physician from our Gastroenterology section is always available through my 24 hour answering service  Once again I welcome you as a new patient and I look forward to a happy and healthy relationship             Sincerely,  Louis Meckel MD  This letter has been electronically signed by your physician.  Appended Document: Results Letter letter mailed

## 2010-03-13 ENCOUNTER — Encounter (HOSPITAL_COMMUNITY): Payer: BC Managed Care – PPO

## 2010-03-13 ENCOUNTER — Encounter (HOSPITAL_COMMUNITY)
Admission: RE | Admit: 2010-03-13 | Discharge: 2010-03-13 | Disposition: A | Discharge status: Home / Self Care | Payer: BC Managed Care – PPO | Source: Ambulatory Visit | Attending: Vascular Surgery | Admitting: Vascular Surgery

## 2010-03-13 ENCOUNTER — Other Ambulatory Visit: Payer: Self-pay | Admitting: Vascular Surgery

## 2010-03-13 DIAGNOSIS — I739 Peripheral vascular disease, unspecified: Secondary | ICD-10-CM

## 2010-03-13 DIAGNOSIS — Z01818 Encounter for other preprocedural examination: Secondary | ICD-10-CM | POA: Insufficient documentation

## 2010-03-13 LAB — URINALYSIS, ROUTINE W REFLEX MICROSCOPIC
Bilirubin Urine: NEGATIVE
Ketones, ur: NEGATIVE mg/dL
Nitrite: NEGATIVE
Protein, ur: NEGATIVE mg/dL
Specific Gravity, Urine: 1.005 (ref 1.005–1.030)

## 2010-03-13 LAB — SURGICAL PCR SCREEN: MRSA, PCR: NEGATIVE

## 2010-03-13 LAB — COMPREHENSIVE METABOLIC PANEL
ALT: 22 U/L (ref 0–35)
Albumin: 4.3 g/dL (ref 3.5–5.2)
CO2: 25 mEq/L (ref 19–32)
Calcium: 9.7 mg/dL (ref 8.4–10.5)
Creatinine, Ser: 1.24 mg/dL — ABNORMAL HIGH (ref 0.4–1.2)
Potassium: 4.5 mEq/L (ref 3.5–5.1)

## 2010-03-13 LAB — TYPE AND SCREEN: ABO/RH(D): A POS

## 2010-03-13 LAB — CBC
Hemoglobin: 13.5 g/dL (ref 12.0–15.0)
Platelets: 278 10*3/uL (ref 150–400)
RBC: 4.27 MIL/uL (ref 3.87–5.11)
RDW: 14 % (ref 11.5–15.5)

## 2010-03-13 LAB — ABO/RH: ABO/RH(D): A POS

## 2010-03-13 LAB — APTT: aPTT: 31 seconds (ref 24–37)

## 2010-03-15 ENCOUNTER — Inpatient Hospital Stay (HOSPITAL_COMMUNITY)
Admission: RE | Admit: 2010-03-15 | Discharge: 2010-03-17 | DRG: 797 | Disposition: A | Discharge status: Home / Self Care | Payer: BC Managed Care – PPO | Source: Ambulatory Visit | Attending: Vascular Surgery | Admitting: Vascular Surgery

## 2010-03-15 DIAGNOSIS — E7800 Pure hypercholesterolemia, unspecified: Secondary | ICD-10-CM | POA: Diagnosis present

## 2010-03-15 DIAGNOSIS — I10 Essential (primary) hypertension: Secondary | ICD-10-CM | POA: Diagnosis present

## 2010-03-15 DIAGNOSIS — T82898A Other specified complication of vascular prosthetic devices, implants and grafts, initial encounter: Secondary | ICD-10-CM

## 2010-03-15 DIAGNOSIS — I70219 Atherosclerosis of native arteries of extremities with intermittent claudication, unspecified extremity: Secondary | ICD-10-CM

## 2010-03-15 DIAGNOSIS — Z7982 Long term (current) use of aspirin: Secondary | ICD-10-CM

## 2010-03-15 DIAGNOSIS — Z01818 Encounter for other preprocedural examination: Secondary | ICD-10-CM

## 2010-03-15 DIAGNOSIS — Z7902 Long term (current) use of antithrombotics/antiplatelets: Secondary | ICD-10-CM

## 2010-03-16 LAB — BASIC METABOLIC PANEL
BUN: 15 mg/dL (ref 6–23)
CO2: 27 mEq/L (ref 19–32)
Calcium: 9 mg/dL (ref 8.4–10.5)
GFR calc Af Amer: >60 mL/min (ref >60)
Glucose, Bld: 120 mg/dL — ABNORMAL HIGH (ref 70–99)

## 2010-03-16 LAB — CBC
RDW: 13.9 % (ref 11.5–15.5)
WBC: 7.3 10*3/uL (ref 4.0–10.5)

## 2010-03-18 NOTE — Procedures (Signed)
[name], [name]                  ACCOUNT NO.:  [id number]  MEDICAL RECORD NO.:  [id number]          PATIENT TYPE:  AMB  LOCATION:  SDS                          FACILITY:  [facility]  PHYSICIAN:  Corky Crafts, MDDATE OF BIRTH:  09/20/1950  DATE OF PROCEDURE:  02/14/2010 DATE OF DISCHARGE:  02/14/2010                   PERIPHERAL VASCULAR INVASIVE PROCEDURE   PRIMARY CARE PHYSICIAN:  Chalmers Guest, MD  PRIMARY CARDIOLOGIST:  Lyn Records, MD  PROCEDURES PERFORMED:  Abdominal aortogram, pelvic angiogram, right lower extremity runoff, left lower extremity runoff, selective left common iliac angiogram, PTH/stent of the left common iliac artery.  OPERATOR:  Corky Crafts, MD  INDICATIONS:  Claudication.  PROCEDURE NARRATIVE:  The risks and benefits of PV angiography were explained to the patient and informed consent was obtained.  She was brought to the [facility].  She was prepped and draped in the usual sterile fashion.  Her left groin was infiltrated with 1% lidocaine.  A 5-French sheath was placed into the left common femoral artery using modified Seldinger technique.  A pigtail catheter was advanced to the abdominal aorta and a power injection of contrast was performed in the AP projection.  The catheter was then withdrawn to the aortoiliac bifurcation and pelvic angiogram was taken.  A crossover catheter was then used to gain access to the right lower extremity with an end-hole catheter.  A right lower extremity runoff was performed from the end- hole catheter which was in the right external iliac artery.  Through the sheath on the left side, a selective left lower extremity runoff was performed.  The sheath was changed out to a 6-French sheath in the left femoral.  The intervention to the left common iliac was performed. Please see below for details.  The sheath was removed using manual compression.  FINDINGS: 1. There was mild aortic atherosclerosis.  There was a  small saccular     infrarenal abdominal aortic aneurysm.  The right renal artery stent     appears patent with only mild in-stent restenosis.  The left renal     artery also appears to be patent from the nonselective picture.     The right common iliac stent was widely patent.  The right external     iliac and internal iliac arteries are patent. 2. The left common internal iliac has an ulcerated plaque in the     proximal portion of the vessel which is causing about a 70%     narrowing.  The left external iliac artery is patent.  There is     disease in the left internal iliac artery but this appears patent. 3. Right leg runoff:  The right common femoral artery is patent.  The     right superficial femoral artery appears occluded right at the     ostium.  There is a large profunda femoral branch which is patent     that gives collaterals to the mid SFA.  SFA starts to reconstitute.     It is back to its normal caliber by the distal SFA.  It is a fairly     long area of  disease in the SFA.  The popliteal artery is patent.     The peroneal artery appears to the dominant vessel.  The right     anterior tibial and posterior tibial artery are small.  Both of     these arteries are occluded by the midportion of the of the vessel. 4. The left leg shows the left common femoral, left SFA, left profunda     femoral artery, and popliteal arteries are patent with mild     atherosclerosis diffusely.  The left anterior tibial and posterior     tibial arteries go to the ankle and are patent.  The left peroneal     artery appears occluded distally. 5. PTA/stent intervention.  A Terumo sheath was used to gain access to     the left common iliac artery.  A 6 x 40 Viatrac balloon was used to     predilate the lesion and inflate it to 10 atmospheres.  A 7 x 39     Genesis stent was then deployed across the area to 10 atmospheres.     There was no residual stenosis and excellent flow through the      stent.  IMPRESSION: 1. Severe right superficial femoral artery disease.  There was a long     occlusion from the ostium of this vessel extending down into the     distal superficial femoral artery where the vessel reconstitutes     from collaterals from the profunda femoral artery. 2. Significant left common iliac stenosis, stented with a 7- x 39-mm     Genesis stent.  RECOMMENDATIONS: 1. Continue aspirin lifelong.  She will need to take Plavix for 30     days.  We will refer to Vascular Surgery for possible bypass of the     right SFA.  It is not a good candidate for percutaneous     intervention. 2. She will also follow up with Ds. Smith regarding her cardiac     status.     Corky Crafts, MD     JSV/MEDQ  D:  02/27/2010  T:  02/28/2010  Job:  [id number]  Electronically Signed by Lance Muss MD on 03/18/2010 09:06:13 AM

## 2010-03-18 NOTE — Op Note (Addendum)
[name], [name]                  ACCOUNT NO.:  [id number]  MEDICAL RECORD NO.:  [id number]           PATIENT TYPE:  LOCATION:                                 FACILITY:  PHYSICIAN:  Larina Earthly, M.D.    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  PROCEDURE:  Right femoral to above-knee popliteal bypass with 6-mm ringed Propaten Gore-Tex graft.  SURGEON:  Larina Earthly, MD  ASSISTANT:  Zenaida Niece, RNFA  ANESTHESIA:  General endotracheal.  COMPLICATIONS:  None.  DISPOSITION:  Recovery room, stable.  PROCEDURE IN DETAIL:  The patient was taken to the operating room and placed in supine position where the right groin, right foot, and leg were prepped and draped in the usual sterile fashion.  An incision was made obliquely just below the inguinal crease and carried down to isolate the common superficial femoral and fundus femoris arteries.  The patient had relatively small caliber arteries.  The superficial femoral artery was occluded at its takeoff.  Next, using a medial approach, an incision was made above the knee and carried down to isolate the above- knee popliteal artery.  The artery was also small, but it was minimal atherosclerotic change.  There was no pulse in the artery.  A tunnel was created from the level of the popliteal artery to the level of the groin.  The patient was given 9000 units of intravenous heparin.  After circulation time, the common superficial femoral and fundus femoris arteries were occluded.  Common femoral artery was opened with an #11 blade and extended longitudinally with Potts scissors.  The Gore-Tex graft was spatulated and sewn end-to-side to the artery with a running 6- 0  Prolene suture.  Anastomosis was tested and found to be adequate.  The graft was flushed with heparinized saline and re-occluded.  Next, the above-knee popliteal artery was occluded proximally and distally and was opened with an #11 blade and extended longitudinally with Potts scissors.  The graft was cut to the appropriate length, was spatulated and sewn end-to-side to the artery with a running 6-0 Prolene suture. Prior to completion anastomosis, #3 dilator was passed easily through the distal portion of the anastomosis in the popliteal artery.  After the usual flushing maneuvers, anastomosis was completed and flow was restored to the foot and graft-dependent Doppler flow was noted at the posterior tibial artery.  The patient was given 50 mg of protamine to reverse the heparin.  The wound was irrigated with saline.  Hemostasis was achieved with electrocautery. Wounds were closed with 2-0 Vicryl in the subcutaneous tissue.  Skin was closed with 3-0 subcuticular Vicryl stitch.  Benzoin and Steri-Strips were applied.  The patient was taken to the recovery room in stable condition.     Larina Earthly, M.D.     TFE/MEDQ  D:  03/15/2010  T:  03/15/2010  Job:  [id number]  cc:   Corky Crafts, MD  Electronically Signed by Dailynn Nancarrow M.D. on 03/18/2010 07:41:51 PM

## 2010-03-26 ENCOUNTER — Encounter (INDEPENDENT_AMBULATORY_CARE_PROVIDER_SITE_OTHER): Payer: BC Managed Care – PPO

## 2010-03-26 ENCOUNTER — Ambulatory Visit (INDEPENDENT_AMBULATORY_CARE_PROVIDER_SITE_OTHER): Payer: BC Managed Care – PPO | Admitting: Vascular Surgery

## 2010-03-26 DIAGNOSIS — I70219 Atherosclerosis of native arteries of extremities with intermittent claudication, unspecified extremity: Secondary | ICD-10-CM

## 2010-03-26 DIAGNOSIS — Z48812 Encounter for surgical aftercare following surgery on the circulatory system: Secondary | ICD-10-CM

## 2010-03-27 NOTE — Assessment & Plan Note (Signed)
OFFICE VISIT  KRISI, AZUA T DOB:  1950/05/27                                       03/26/2010 ZOXWR#:[id number]  The patient presents today for followup of her right fem-pop bypass for limiting claudication.  On March 15, 2010, she had a vein graft, right femoral to above knee.  She has done well with the usual amount of discomfort related to this now being approximately 10 days postop.  Her incisions look quite good.  She does have a Vicryl stitch in the upper pole of her groin incision and this was removed.  She does have palpable popliteal and 1 to 2+ posterior tibial pulse on the right.  Ankle-arm index has improved to 0.91 on the right and remains at 0.8 on the left. I am quite pleased with her initial result as is the patient.  She will continue her activity and is tentatively released to work as long as her soreness is tolerable on April 08, 2010.  We will see her again in 3 months for continued followup.    Larina Earthly, M.D. Electronically Signed  TFE/MEDQ  D:  03/26/2010  T:  03/27/2010  Job:  [id number]  cc:   Dr. Greta Doom Corky Crafts, MD

## 2010-04-01 NOTE — Discharge Summary (Addendum)
[name], [name]                  ACCOUNT NO.:  [id number]  MEDICAL RECORD NO.:  [id number]           PATIENT TYPE:  I  LOCATION:  2011                         FACILITY:  [facility]  PHYSICIAN:  Larina Earthly, M.D.    DATE OF BIRTH:  July 17, 1950  DATE OF ADMISSION:  03/15/2010 DATE OF DISCHARGE:  03/17/2010                              DISCHARGE SUMMARY   This is a patient of Dr. Arbie Cookey.  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  She has had progressive bilateral lower extremity claudication symptoms and CT angiogram on January 03, 2010, and a formal angiogram revealed significant stenosis of left common iliac artery and she has successful stenting of this by Dr. Eldridge Dace. She does have a complete occlusion of her superficial femoral artery at the origin with reconstruction of the above-knee popliteal with three- vessel runoff.  The dominant runoff vessel was peroneal on the right. She has some discomfort in the knee while walking.  The majority of symptoms related to right calf claudication.  She was admitted to the hospital for a right fem-pop bypass.  PAST MEDICAL HISTORY: 1. Hypertension. 2. Hypercholesterolemia. 3. Cholecystectomy and hysterectomy.  HOSPITAL COURSE:  The patient was taken to the operating room on March 15, 2010, for a right femoral to above-knee popliteal bypass with 6-mm ringed Propaten graft.  Postoperatively, the patient did well. She had a biphasic signal in the DP and PT and Doppler signal in the DP and PT on the left with less brisk.  The dorsalis pedis pulse was palpable over the next couple days on the right.  She was discharged on March 17, 2010, with follow up with Dr. Arbie Cookey in 2 weeks.  DISCHARGE MEDICATIONS:  Include 1. Nitroglycerin as needed. 2. Plavix 75  mg daily. 3. Tribenzor 40/5/25 mg half tablet in the morning and half tablet in     the evening. 4. Flaxseed oil one every morning. 5. CoQ10 every morning. 6. Aspirin 325 mg in the morning. 7. Vitamin B complex in the morning. 8. Zofran 4 mg for nausea. 9. Potassium gluconate 99 mg every morning. 10.Ambien 5 mg as needed for sleep. 11.Omeprazole 20 mg twice daily. 12.She was also given a prescription for Percocet 1-2 tablets every 4     hours as needed for pain.  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  She had a right femoral to above- knee popliteal bypass.  DISPOSITION:  She will be discharged to home.  She will follow up with Dr. Arbie Cookey in 2 weeks.     Della Goo, PA-C   ______________________________ Larina Earthly, M.D.    RR/MEDQ  D:  03/29/2010  T:  03/30/2010  Job:  [id number]  Electronically Signed by Della Goo PA on 04/01/2010 01:07:34 PM Electronically Signed by Tawanna Cooler Kalese Ensz M.D. on 04/09/2010 09:59:47 AM Electronically Signed by Orie Baxendale M.D. on 04/09/2010 10:11:56 AM Electronically Signed by Jewels Langone  M.D. on 04/09/2010 10:21:02 AM Electronically Signed by Alease Fait Lucill Mauck M.D. on 04/09/2010 10:29:54 AM Electronically Signed by Fiorela Pelzer Carmela Piechowski M.D. on 04/09/2010 10:38:54 AM Electronically Signed by Brenn Gatton Ezrah Panning M.D. on 04/09/2010 10:48:11 AM Electronically Signed by Tawanna Cooler Daira Hine M.D. on 04/09/2010 10:58:21 AM Electronically Signed by Cris Gibby Kevontae Burgoon M.D. on 04/09/2010 11:08:20 AM Electronically Signed by Tawanna Cooler Keyron Pokorski M.D. on 04/09/2010 11:18:51 AM Electronically Signed by Tawanna Cooler Aryan Bello M.D. on 04/09/2010 11:31:43 AM Electronically Signed by Tawanna Cooler Jacquan Savas M.D. on 04/09/2010 11:45:11 AM Electronically Signed by Tawanna Cooler Ralston Venus M.D. on 04/09/2010 11:59:01 AM Electronically Signed by Tawanna Cooler Fong Mccarry M.D. on 04/09/2010 12:13:29 PM Electronically Signed by Tawanna Cooler Alta Goding M.D. on 04/09/2010 12:29:17 PM Electronically Signed by  Tawanna Cooler Zarie Kosiba M.D. on 04/09/2010 12:46:32 PM Electronically Signed by Tawanna Cooler Pershing Skidmore M.D. on 04/09/2010 01:04:33 PM Electronically Signed by Tawanna Cooler Chaelyn Bunyan M.D. on 04/09/2010 01:24:51 PM Electronically Signed by Tawanna Cooler Corbin Falck M.D. on 04/09/2010 01:46:19 PM Electronically Signed by Tawanna Cooler Karlie Aung M.D. on 04/09/2010 02:06:07 PM Electronically Signed by Tawanna Cooler Jaeven Wanzer M.D. on 04/09/2010 02:26:32 PM Electronically Signed by Tawanna Cooler Tilda Samudio M.D. on 04/09/2010 02:49:28 PM Electronically Signed by Tawanna Cooler Traniece Boffa M.D. on 04/09/2010 03:13:33 PM Electronically Signed by Tawanna Cooler Ciel Yanes M.D. on 04/09/2010 03:38:13 PM Electronically Signed by Tawanna Cooler Anberlin Diez M.D. on 04/09/2010 04:13:52 PM Electronically Signed by Tawanna Cooler Odilia Damico M.D. on 04/09/2010 04:45:11 PM Electronically Signed by Tawanna Cooler Tarell Schollmeyer M.D. on 04/09/2010 05:18:26 PM Electronically Signed by Tawanna Cooler Laneshia Pina M.D. on 04/09/2010 05:18:26 PM Electronically Signed by Tawanna Cooler Liberta Gimpel M.D. on 04/09/2010 07:36:58 PM
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
## 2010-06-21 NOTE — Op Note (Signed)
[name], [name]                  ACCOUNT NO.:  [id number]   MEDICAL RECORD NO.:  [id number]          PATIENT TYPE:  OIB   LOCATION:  6523                         FACILITY:  [facility]   PHYSICIAN:  Richard A. Alanda Amass, M.D.DATE OF BIRTH:  21-Oct-1950   DATE OF PROCEDURE:  11/21/2004  DATE OF DISCHARGE:                                 OPERATIVE REPORT   PROCEDURE:  1.  Retrograde abdominal aortic catheterization.  2.  Abdominal aortic angiogram, midstream posteroanterior projection.  3.  Bilateral iliac angiogram, posteroanterior projection.  4.  Selective left subclavian angiogram, shallow oblique projection.  5.  Bilateral selective renal angiography.  6.  Percutaneous transluminal angioplasty, right renal artery.  7.  Subsequent 6-mm x 12-mm Genesis Cordis balloon expandable stent, right      common femoral artery closure with 6-French AngioSeal device,      successful.   BRIEF HISTORY:  Mrs. Ballantine is a 60 year old married white woman who is a  Pensions consultant at [employer].  She is a prior smoker up until her  recent CABG, has a history of severe systemic hypertension, hyperlipidemia,  claudication.  She has had remote RCA with patch angioplasty by Dr. Hart Rochester,  February 2005.  Catheterization by Dr. Elsie Lincoln with positive Cardiolite and  angina demonstrated normal LV function and significant three-vessel coronary  disease.  She had high-grade left subclavian stenosis and high-grade right  iliac stenosis.  Pre CABG, she underwent successful L SCA, PTA and stenting  with a 7-mm x 15 and tandem 6-mm x 12 balloon expandable stents with good  angiographic result and excellent antegrade LIMA flow (Jun 03, 2004).  She  also underwent right common iliac stenting with an 8-mm x 24-mm Genesis  balloon expandable stent for claudication and to maintain access.  She  subsequently underwent a elective CABG x3 with LIMA to LAD, RIMA to distal  right and SVG to OM by Dr. Pilar Jarvis on Jun 04, 2004.  She has returned to  work and done well postoperatively.  She has known right renal artery  stenosis and blood pressures has exacerbated postop and been resistant to  medical therapy and she is now referred for renal artery intervention.  Preoperative laboratories showed BUN and creatinine 20/1.3, normal coag's,  normal CBC and differential.  She is on outpatient aspirin and Plavix.  Informed consent was obtained to proceed with catheterization and  intervention.  She was brought to the second floor CP lab in a  postabsorptive state after 5 mg of Valium p.o. premedication.  The right  groin was prepped, draped in the usual manner, 1% Xylocaine was used for  local anesthesia and CR FA was entered with a single anterior puncture using  modified Seldinger technique with a 18 thin-walled needle and a short 6-  Jamaica Daig sidearm sheath was inserted without difficulty.  The patient was  given total of 4500 units of intravenous heparin, monitoring ACTs.  She was  given 2 mg of Nubain and 25 mcg of fentanyl IV for sedation.  Abdominal  aortic angiogram was done in the midstream PA  projection at 25 mL, 20 mL per  second, above the level of the renal arteries with a pigtail 6-French  catheter.  A second injection was obtained above the iliac bifurcation.  A  long right coronary catheter was used for selective left subclavian  injection.  Bilateral selective renal angiography was done with a 6-French  JR4 guiding catheter.  The patient tolerated the diagnostic procedure well.  Arterial pressures were monitored throughout the procedure and ranged 160-  180 mmHg with sinus rhythm.   Abdominal angiogram demonstrated moderately severe infrarenal  atherosclerotic disease with fusiform dilatation of the mid-to-distal  infrarenal abdominal aorta without any definite aneurysm formation or  stenosis.  Ulcerative plaques were present.  The SMA and celiac were patent  proximally.  Both renal arteries  were single.   Iliac injection above the iliac bifurcation demonstrated widely patent right  common iliac stent with approximately 10% to 20% residual narrowing and good  flow.  The right external iliac had no significant stenosis.  The right  hypogastric was intact.   The left common iliac had segmental 70% stenosis and an area of linear hypo  density and possible higher-grade stenosis of the midportion.  There was  overlap, but it appeared that the left hypogastric was intact and the left  external iliac had no significant stenosis.   The left subclavian tandem stents were widely patent with approximately 10%  to 15% narrowing.  The left vertebral was antegrade and the LIMA was widely  patent proximally.   The left renal artery showed 30% tandem eccentric lesions in the proximal  third with no significant ostial stenosis.   The right renal artery showed a patent ostia, but just beyond this there was  high-grade 75% to 85% tandem lesions with poststenotic dilatation.   It was elected to proceed with right renal artery intervention in this  setting.  The patient was given heparin, monitoring ACTs as outlined above.  Using a no-touch technique, with a 6-French JR4 short guiding catheter and  a 0.014-inch Cordis stabilizer balloon, the lesion was crossed with the  guidewire free  and visualized throughout the procedure in the distal renal  artery.  The right renal artery was then predilated with a 4.5 /12 Quantum  Maverick balloon and 7-30 and 8-30.  The balloon was then exchanged for a  premounted Cordis 6-mm x 12-mm Genesis stent on an Aviator balloon.  It was  positioned just at the renal ostia to cover the lesion, did not extended  into the post-stenotic dilatation site, deployed at 8-30 and post-dilated at  8-30.  The balloon was pulled back and final injection showed excellent  angiographic result with stenosis reduced to 0, good stent expansion.  The stent was approximately 1-2  mm inside the right renal ostia, but there was  no ostial stenosis with a good funnel and excellent flow.  The dilatation  system was removed.  Right femoral angiogram in the oblique projection  showed puncture above the profunda bifurcation.  The right groin puncture  was then closed with a 6-French Angio-Seal device successfully.  The patient  was transferred to the holding area for postoperative care in stable  condition.  She tolerated the procedure well.   I would recommend followup renal Dopplers and surveillance.  Continue  medical therapy of her hypertension with medication adjustment as outlined.  I would continue aspirin and Plavix from a peripheral standpoint, Plavix  approximately 1 month.  Dr. Elsie Lincoln may want to continue this,  since she does  have symptomatic peripheral vascular disease.  Followup lower extremity  Dopplers if she has clinical claudication may be helpful.  She does have  moderate-to-moderately severe and possibly a high-grade lesion in the distal  L CIA besides segmental disease there that would be amendable to PCI.  She  has not had full lower extremity Doppler evaluation and I would recommend  that as well.   CATHETERIZATION DIAGNOSES:  1  Renal-resistant hypertension, renovascular,  with known high-grade right renal artery stenosis.  1.  Successful percutaneous transluminal angioplasty and stenting, right      renal artery.  2.  Widely patent left subclavian artery stent from preoperative procedure,      Jun 03, 2004.  3.  Patent right common iliac artery stent from preoperative procedure, Jun 03, 2004.  4.  Status post coronary artery bypass graft x3, Jun 04, 2004, Dr.      Pilar Jarvis, as outlined above.  5.  Hyperlipidemia.  6.  Systemic hypertension.  7.  Cigarette abuse.  8.  Noncritical mild left renal artery stenosis.  9.  Residual moderately severe or possibly greater left common iliac stent      with prior history of claudication.  10.  Remote cigarette abuse, discontinued preoperatively, May 2006.      Richard A. Alanda Amass, M.D.  Electronically Signed     RAW/MEDQ  D:  11/21/2004  T:  11/22/2004  Job:  [id number]   cc:   Madaline Savage, M.D.  Fax: [phone]   Salvatore Decent Dorris Fetch, M.D.  [address]  [place]  [place] [address]   Louanna Raw  Fax: [phone]

## 2010-06-21 NOTE — Op Note (Signed)
NAMESAVAYA, [name]                  ACCOUNT NO.:  [id number]   MEDICAL RECORD NO.:  [id number]          PATIENT TYPE:  INP   LOCATION:  2309                         FACILITY:  [facility]   PHYSICIAN:  Salvatore Decent. Dorris Fetch, M.D.DATE OF BIRTH:  11-24-1950   DATE OF PROCEDURE:  06/04/2004  DATE OF DISCHARGE:                                 OPERATIVE REPORT   PREOPERATIVE DIAGNOSIS:  Left main and three-vessel coronary artery disease.   POSTOPERATIVE DIAGNOSIS:  Left main and three-vessel coronary artery  disease.   PROCEDURE:  Median sternotomy, extracorporeal circulation, and coronary  artery bypass grafting x 3 (left internal mammary artery to LAD, right  internal mammary artery to distal right coronary, saphenous vein graft to  obtuse marginal), endoscopic vein harvesting right thigh.   SURGEON:  Salvatore Decent. Dorris Fetch, M.D.   ASSISTANT:  Jerold Coombe, P.A.   ANESTHESIA:  General.   FINDINGS:  Good quality targets, good quality conduits.  Saphenous vein from  right leg small at level of knee.  Left leg explored.  Vein the same size.  Vein was not harvested from the left.  When it was harvested from the right,  the vein from the thigh was of good quality.  The vein from the level of the  knee was not used.   CLINICAL NOTE:  [name] is a 60 year old female who had presented with  complaints of generally feeling poorly.  She was found to be severely  hypertensive.  She was treated and referred to Dr. Chanda Busing for  further evaluation and treatment.  A stress test was positive, and she  underwent cardiac catheterization which revealed left main and three-vessel  disease.  [name] was referred for coronary artery bypass grafting.  The  indications, risks, benefits, and alternatives were discussed in detail with  the patient.  She understood and accepted the risk and agreed to proceed.  She had also been noted at catheterization to have a 75% left subclavian  artery  stenosis.  She underwent percutaneous angioplasty of that vessel by  Dr. Susa Griffins on Jun 03, 2004.  The procedure went well, with a good  result.  She had an excellent pulse in her left arm postprocedure.   [name] understood and accepted the risk of coronary artery bypass grafting  and agreed to proceed.   OPERATIVE NOTE:  [name] was brought to the preop holding area on Jun 04, 2004.  There, lines were placed by the anesthesia service to monitor  arterial, central venous, and pulmonary arterial pressure.  Intravenous  antibiotics were administered.  The patient was taken to the operating room,  anesthetized, and intubated.  A Foley catheter was placed, and the chest,  abdomen, and legs were prepped and draped in the usual fashion.   An incision was made in the medial aspect of the right leg at the level of  the knee.  The greater saphenous vein was identified.  It was relatively  small at this site.  Therefore, the vein was explored at the left knee.  It  was  of similar caliber there.  The vein was ultimately harvested  endoscopically from the right leg without difficulty and was of good quality  above the knee.  The vein was not harvested from the left leg.   Simultaneously, a median sternotomy was performed.  The left internal  mammary artery was harvested using standard technique.  It was a good  quality vessel.  The mammary was left attached distally until after  heparinization.  The right internal mammary artery then was harvested, again  using standard technique.  It was also a good quality vessel.  The patient  was fully heparinized prior to finding the distal end of the right mammary  artery.   The pericardium was opened.  The right coronary artery was inspected.  An  incision was made in the pericardium on the right aide to allow passage of  the right mammary artery.  The right mammary artery reached the distal right  coronary without tension and was elected to be  used as a pedicle graft to  that site.  The aorta was inspected and palpated.  There was no palpable  atherosclerotic disease.  The aorta was cannulated via concentric 2-0  Ethibond pledgeted pursestring sutures.  A dual-stage venous cannula was  placed via pursestring suture in the right atrium.  Of note, the right  atrium was very small, and cannulation was difficulty due to the small size  of the right atrium.  This can be a concern during redo grafting.  Cardiopulmonary bypass was instituted, and the patient was cooled to 32  degrees Celsius.  The coronary arteries were inspected, and anastomotic  sites were chosen.  The conduits were inspected and cut to length.  The foam  pad was placed in the pericardium to protect the phrenic nerve.  A  temperature probe was placed in the myocardial septum, and a cardioplegia  cannula was placed in the ascending aorta.   The aorta was cross-clamped.  The left ventricle was emptied via the aorta  root vent.  Cardiac arrest then was achieved with a combination of cold  antegrade blood cardioplegia and topical iced saline.  After achieving a  complete diastolic arrest and adequate myocardial septal cooling, the  following distal anastomoses were performed.   First, a reverse saphenous vein graft was placed end to side to the first  obtuse marginal.  This was a large dominant lateral branch.  It was a 1.8 mm  vessel at the site of the anastomosis.  The vein graft was anastomosed end  to side with a running 7-0 Prolene suture.  The vein graft was of excellent  quality.  This anastomosis was probed proximally and distally at its  completion to ensure patency, as were all the anastomoses at their  completion.  The graft flushed easily.  Cardioplegia was administered.  There was good flow and good hemostasis.   Next, the distal end of the right mammary artery was spatulated, and it was anastomosed end to side to the distal right coronary.  The distal  right  coronary was a 2 mm vessel.  A 1.5 mm probe passed easily into the takeoff  of the posterior descending as well as into the continuation of the right  coronary.  The distal right coronary was a good quality vessel.  The mammary  to coronary anastomosis was performed with a running 8-0 Prolene suture.  At  the completion of the anastomosis, the bulldog clamps were briefly removed  to inspect for hemostasis.  The bulldog clamp then was replaced, and the  mammary pedicle was tacked to the epicardial surface of the heart with 6-0  Prolene sutures.   Next, the left internal mammary artery was brought through a window in the  pericardium.  The distal end was spatulated.  It then was anastomosed end to  side to the distal LAD.  There was some plaquing throughout the LAD.  However, it was a 1.5 mm good quality vessel at the site of the anastomosis,  and a 1.5 mm probe did pass back proximally in as well as distally to the  apex.  The anastomosis was performed with a running 8-0 Prolene suture in an  end-to-side fashion.  At the completion of the anastomosis, the bulldog  clamp was briefly removed to inspect for hemostasis.  Immediate __________  septal rewarming was noted.  The bulldog clamp was replaced.  The mammary  pedicle was tacked to the epicardial surface of the heart with 6-0 Prolene  sutures.   Additional cardioplegia was then administered.  The cardioplegia cannula  then was removed from the ascending aorta.  The vein graft was cut to  length, and the proximal vein anastomosis was performed to 4 mm punch  aortotomy with a running 6-0 Prolene suture.  At the completion of the  anastomosis, lidocaine was administered.  The bulldog clamp was again  removed from the left mammary arteries.  The patient was placed in  Trendelenburg position.  The aortic root was de-aired, and the aortic cross-  clamp was removed.  The total cross-clamp time was 55 minutes.  The patient  fibrillated  initially and required a single defibrillation with 20 joules.  She remained in sinus rhythm thereafter.  A low-dose dopamine infusion was  used during the bypass run due to the patient's mild renal insufficiency.  She became tachycardic after cardioversion, and the dopamine was  discontinued.  While the patient was being rewarmed, all proximal and distal  anastomoses were inspected for hemostasis.  Epicardial pacing wires were  placed on the right ventricle and right atrium.  When the patient had  rewarmed to a core temperature of 37 degrees Celsius, she was weaned from  cardiopulmonary bypass without difficulty.  She was on no inotropic support  and in sinus rhythm but atrially paced for rate at the time of separation  from bypass.  Total bypass time was 78 minutes.  The initial cardiac index  was greater than 2 liters/minute/sq m.  The patient remained hemodynamically  stable throughout the post-bypass period.  A test dose of protamine was administered and was well tolerated.  The  atrial and aortic cannulae were removed.  There was good hemostasis of both  cannulation sites.  The remainder of the protamine was administered without  incident.  The chest was irrigated with 1 liter of warm normal saline  containing 1 g of vancomycin.  Hemostasis was achieved.  The pericardium was  reapproximated with interrupted 3-0 silk sutures that came together easily  without tension.  Inferiorly, the pericardium from the left side was  attached to the pleural and mediastinal fat from the right side to prevent  tension on the mammary artery graft.  Bilateral pleural and single  mediastinal chest tubes were placed through separate subcostal incisions.  The sternum was closed with heavy gage interrupted stainless steel wires.  The remainder of the incisions were closed in a standard fashion.  Subcuticular closures were used for the skin.  All sponge, needle, and  instrument counts were correct at the  end of the procedure.  There were no  intraoperative complications.  The patient was taken from the operating room  to the surgical intensive care unit in critical but stable condition.      SCH/MEDQ  D:  06/04/2004  T:  06/04/2004  Job:  [id number]   cc:   Madaline Savage, M.D.  [address]  [place]  [place] [address]  Fax: [phone]

## 2010-06-21 NOTE — Discharge Summary (Signed)
[name], [name]                  ACCOUNT NO.:  [id number]   MEDICAL RECORD NO.:  [id number]          PATIENT TYPE:  OIB   LOCATION:  6523                         FACILITY:  [facility]   PHYSICIAN:  Richard A. Alanda Amass, M.D.DATE OF BIRTH:  [birth year]   DATE OF ADMISSION:  11/21/2004  DATE OF DISCHARGE:  11/22/2004                                 DISCHARGE SUMMARY   ADMISSION DIAGNOSES:  1.  Hypertension with renal artery stenosis.  2.  Coronary artery disease, status post coronary artery bypass graft May      2006.  3.  Status post right carotid endarterectomy February 2005 and left      subclavian percutaneous transluminal angioplasty May 2006.  4.  Severe hypertension.  5.  Tobacco use.   DISCHARGE DIAGNOSES:  1.  Hypertension with renal artery stenosis.  2.  Coronary artery disease, status post coronary artery bypass graft May      2006.  3.  Status post right carotid endarterectomy February 2005 and left      subclavian percutaneous transluminal angioplasty May 2006.  4.  Severe hypertension.  5.  Tobacco use.   PROCEDURE:  Abdominal aortogram, percutaneous coronary intervention and  stenting of the right renal artery, November 21, 2004.   BRIEF HISTORY:  The patient is a 60 year old female patient of Dr. Elsie [name],  with uncontrolled hypertension, who has a known right renal artery stenosis,  75%.  She is status post coronary artery bypass grafting in May 2006 with a  LIMA to the LAD, right internal mammary to the distal RCA, saphenous vein  graft to th EOM.  She has done well.  She has had a right carotid  endarterectomy by [name], M.D., in May 2005.  She was admitted at  this time for elective arteriography and renal artery intervention.   PAST MEDICAL HISTORY:  As above.   ALLERGIES:  CODEINE, MORPHINE, IV DYE and DOPAMINE cause significant nausea  and vomiting in the past, also MECLIZINE has been used and she has not  tolerated it, and SULAR causes dizziness at  the 20 mg dose.   SOCIAL HISTORY:  She is married, no children.  She does exercise by walking.  She stopped using tobacco in May.  She does not use drugs.   HOSPITAL COURSE:  The patient was admitted, taken to the catheterization  lab.  She underwent arteriography.  Then she underwent dilatation of her  right renal artery.  She tolerated it well.  She was seen the following  morning by [name], M.D.  The catheterization site showed no  bruising.  She was mobilized and subsequently discharged home.   DISCHARGE MEDICATIONS:  1.  Cipro 250 mg q.8h.  2.  Benicar 40/25 mg one daily.  3.  Aspirin 325 mg daily.  4.  Hydrochlorothiazide 25 mg daily.  5.  Plavix 75 mg daily.  6.  Prinivil 10 mg daily.  7.  Protonix 40 mg daily.  8.  Toprol XL 100 mg daily.  9.  Vytorin 10/40 one daily.  10. Wellbutrin 150 mg daily  h.s.   DISCHARGE ACTIVITY:  Light to moderate for 36 hours.   The patient is scheduled to return to see Dr. Alanda Amass in two weeks.   CONDITION ON DISCHARGE:  Improved.   DISCHARGE LABS:  Hemoglobin is 12, hematocrit 34.9, white count is 6000,  platelets are 226,000.  Sodium is 140, potassium is 3.5, chloride is 106,  CO2 is 26, BUN is 17, creatinine is 1.1, glucose is 89.   CONDITION ON DISCHARGE:  Improved.      [name], P.A.      Richard A. Alanda Amass, M.D.  Electronically Signed    WDJ/MEDQ  D:  01/15/2005  T:  01/16/2005  Job:  [id number]   cc:   Madaline Savage, M.D.  Fax: [phone]

## 2010-06-21 NOTE — H&P (Signed)
[name], [name]                  ACCOUNT NO.:  [id number]   MEDICAL RECORD NO.:  [id number]          PATIENT TYPE:  AMB   LOCATION:  SDS                          FACILITY:  [facility]   PHYSICIAN:  Richard A. Alanda Amass, M.D.DATE OF BIRTH:  August 31, 1950   DATE OF ADMISSION:  11/21/2004  DATE OF DISCHARGE:                                HISTORY & PHYSICAL   REASON FOR ADMISSION:  Renal artery angiogram for known renal artery  stenosis.   HISTORY OF PRESENT ILLNESS:  A 60 year old white married female cardiology  patient of Dr. Elsie Lincoln, peripheral patient of Dr. Alanda Amass with known  history of uncontrolled hypertension.  She has a known right renal artery  stenosis of 75%.  Other history includes coronary artery bypass grafting May  of 2006 with a LIMA to the LAD, right internal mammary to the distal RCA,  saphenous vein graft to the obtuse marginal.  She did well with that.  She  also has a right common iliac stenosis.  Other history includes carotid  artery disease and underwent right carotid endarterectomy by Dr. Hart Rochester in  March of 2005.  Other history status post cholecystectomy, hysterectomy, and  hemorrhoidectomy.  Other history includes anxiety.   OUTPATIENT MEDICATIONS:  1.  Benicar/HCTZ 40/20 daily.  2.  Aspirin 325 daily.  3.  Prilosec OTC 20 mg daily.  4.  Wellbutrin 150 daily.  5.  Vytorin 10/40 daily.  6.  Lunesta 10 mg p.r.n.  7.  Toprol XL 50 daily.  8.  Plavix 75 daily.  9.  Benadryl p.r.n.  10. Xanax p.r.n.  11. Sular was stopped due to dizziness.  12. Lisinopril.  Unsure of the dose.   Please note, there are some exceptions.  Her Toprol has been increased to  100 mg daily and her Prilosec is twice a day now.   She is only on 10 mg of Sular.   ALLERGIES:  CODEINE, MORPHINE, IVP DYE, DOPAMINE has caused significant  nausea and vomiting in the past, also MECLIZINE she does not tolerate and  SULAR caused dizziness at least at 20 mg dose.   SOCIAL HISTORY:   Married.  No children.  She does exercise by walking.  She  had stopped tobacco since May and does not use any alcohol.   REVIEW OF SYSTEMS:  No lightheadedness, dizziness, or syncope.  SKIN:  No  rashes.  GENERAL:  She had upper respiratory infection treated with  Zithromax in September and this procedure was canceled at that time due to  that cold.  Now that has resolved.  MUSCULOSKELETAL:  No claudication  currently.  No joint pain.  HEENT:  Eyes:  No blurred vision.  LUNGS:  Denies shortness of breath.  CARDIOVASCULAR:  No chest pain.  No edema.  GI:  No indigestion, diarrhea, constipation, or melena.  GU:  No hematuria or  dysuria.  NEUROLOGIC:  No lightheadedness or dizziness as stated.   FAMILY HISTORY:  One sister with hypertension, otherwise no changes.   PHYSICAL EXAMINATION:  VITAL SIGNS:  Blood pressure 129/88, pulse 73,  respirations 16, temperature  98.2, oxygen saturation 97%.  GENERAL:  Alert and oriented white female in no acute distress.  SKIN:  Warm and dry.  Brisk capillary refill.  HEENT:  Sclerae clear.  NECK:  Supple.  No JVD.  No bruits are detected.  LUNGS:  Clear without rales, rhonchi, or wheezes.  HEART:  S1, S2.  Regular rate and rhythm without murmur, gallop, rub, or  click.  ABDOMEN:  Soft, nontender.  Positive bowel sounds x4 quadrants.  Do not  palpate liver, spleen, or masses.  EXTREMITIES:  Lower extremities without edema.  Pedal pulses were present.   ASSESSMENT:  1.  Renal artery stenosis for intervention today with PV angiogram.  2.  Hypertension currently controlled.  3.  Coronary disease with bypass grafting this year.  4.  Multiple allergies.   Chest x-ray revealed postoperative changes, but no active disease.   PLAN:  Proceed with peripheral angiogram, renal artery angiogram, and  possible intervention for Dr. Alanda Amass.  Patient had no questions after  examination.      Darcella Gasman. Ingold, N.P.      Richard A. Alanda Amass, M.D.   Electronically Signed    LRI/MEDQ  D:  11/21/2004  T:  11/21/2004  Job:  [id number]   cc:   Madaline Savage, M.D.  Fax: [phone]

## 2010-06-21 NOTE — Op Note (Signed)
[name], [name]                  ACCOUNT NO.:  [id number]   MEDICAL RECORD NO.:  [id number]          PATIENT TYPE:  OIB   LOCATION:  3735                         FACILITY:  [facility]   PHYSICIAN:  Richard A. Alanda Amass, M.D.DATE OF BIRTH:  12/20/1950   DATE OF PROCEDURE:  06/03/2004  DATE OF DISCHARGE:                                 OPERATIVE REPORT   PROCEDURES:  1. Retrograde central aortic catheterization.  2. Aortic arch angiogram LAO projection.  3. Selective right brachiocephalic injection.  4. Left subclavian injection and transstenotic gradient measurement.  5. Left subclavian PTA and subsequent tandem balloon expandable stents.  6. Abdominal aortic angiogram mid-stream PA projection.  7. Bilateral iliac angiogram PA projection DSA imaging.  8. Selective right iliac angiogram with subsequent PTA and stent for high-      grade or CIA stenosis.     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.  She is a smoker with systemic  hypertension, hyperlipidemia, bilateral claudication, and status post RCA  with patch angioplasty by Dr. Hart Rochester, February 2005.  History of cigarette  abuse, positive family history of coronary disease, and positive Cardiolite  of April 05, 2004, prompting diagnostic catheterization, which was performed  by Dr. Elsie Lincoln on April 30, 2004, at [facility].  This  demonstrated 60% or greater left main coronary disease, high-grade ostial  proximal circumflex, and mid-RCA stenosis with normal LV function.  There  was high-grade left subclavian stenosis and a very tortuous proximal vessel  and significant right common iliac and right renal artery stenosis.  She was  seen by Dr. Dorris Fetch and is scheduled for elective CABG tentatively on  Jun 04, 2004.  She was referred for peripheral vascular evaluation for  dominantly left subclavian PTA and stenting in order to preserve her LIMA  for bypass surgery.  She has associated claudication bilaterally, right  worse than left.  Informed consent was obtained to proceed after the  procedure risks were fully explained to the patient.  Baseline creatinine  was 1.3, she was hydrated preoperatively and given 125 mg of Solu-Medrol x1  for a history of POSSIBLE DYE ALLERGY.  Her Benicar (ARB) and diuretic were  held.  The right groin was prepped, draped in the usual manner, 1% Xylocaine  was used for local anesthesia.  There was only a faint right femoral pulse,  but the CRFA was easily entered with a single anterior puncture using a  Doppler flow-guided smart needle.  A 0.035-inch wholly wire was used to  negotiate the right iliac system.  A short 6-French side-arm sheath was  inserted without difficulty and a pigtail catheter was placed in the aortic  arch.  Aortic arch angiography was done in the shallow LAO projection with  DSA at 25 mL, 25 mL per second.  The catheter was pulled down above the  level of the renal arteries and abdominal aortic angiogram was done in the  mid-stream PA projection at 20 mL, 20 mL per second, with DSA imaging.  A  second injection at the same settings was taken above the iliac bifurcation.  Arterial pressures were monitored throughout the procedure.  The patient was  quite anxious, jittery, and shaking because of anxiety at the beginning of  the procedure, but she responded quite well to sedation.  She was given a  total of 50 of Benadryl IV prior to the procedure.  During the procedure,  she was given a total of 4 mg of Versed in divided doses and 25 mcg of  Fentanyl IV.  After insertion of the arterial catheter and diagnostic  angiography, she was given 4000 units of intravenous heparin monitoring  ACTs.  Arterial pressures ranged 160 to 180 mmHg.  There was a 60 to 70 mm  transstenotic gradient across the left subclavian stenosis and there was a  40 mmHg transstenotic gradient across the right  common iliac stenosis with  Bentall catheters.  Aortic arch angiogram in the LAO projection demonstrated  the Type-I arch.  There was mild ectasia of the right brachiocephalic.  The  previous RCA patch angioplasty was widely patent with good flow and no  stenosis of the proximal RICA.  The right subclavian showed irregularities  with no stenosis, the right vertebral was large with antegrade flow.   There was normal origin of the LCCA from the aortic arch, which appeared  widely patent.  On the single injection, there was approximately 30%  narrowing of the proximal LICA and the left external carotid was intact.   The left subclavian was tortuous and had eccentric, high-grade stenosis with  antegrade flow.  The left vertebral was antegrade and there was no  significant subclavian stenosis.  The aortic arch had no significant  calcification, ectasia, or ulcerative plaques seen.  The RIMA was patent and  the LIMA had very slow antegrade flow presumably due to the high-grade left  subclavian stenosis.   Abdominal angiogram demonstrated moderately severe ulcerative infrarenal  atherosclerotic disease with a left lateral ulceration below the renal  arteries.  The proximal celiac and SMA were intact and the IMA was intact.  The left renal artery had irregularities and about 20% proximal narrowing  with good flow.   The right renal artery had high-grade proximal 80 to 85% stenosis with mild  post-stenotic dilatation beyond the ostia segmentally.   The left common iliac had medial ulceration and plaque at 40 to 50%  segmental narrowing.  The right common iliac had 70 to 80% concentric  narrowing beyond its origin.  The hypogastrics were intact bilaterally and  there was no significant external iliac stenosis bilaterally.   Selective right brachiocephalic injection did not show any left-sided  retrograde subclavian flow.  This was done with by hand injection with DSA with a 6-French  diagnostic catheter.   Selective left subclavian injection demonstrated a complex, tortuous,  proximal, left subclavian with eccentric 85% segmental stenosis.  There was  an antegrade LIMA and left vertebral flow on selective injection and  gradient as outlined above.   The patient was heparinized with 4000 units.  The left subclavian stenosis  was crossed with a 0.035-inch wholly wire through a 5-French diagnostic  right coronary long catheter.  The wire was free in the axillary artery and  the catheter removed.  The short side-arm sheath was then changed for a 90  cm Terumo flexible side-arm sheath with attached Tuohy.  This was brought up  to the left subclavian stenosis.  The subclavian was then dilated with a  4  mm x 2 cm Cordis Power-Flex balloon at 7-40.  The balloon was removed, the  sheath was placed across the stenosis, and the balloon was exchanged for a  pre-mounted 7 mm x 12 mm Cordis Genesis stent on an aviator balloon.  This  was positioned fluoroscopically to be careful to avoid the left vertebral  and LIMA and deployed at 9-30 and 12-30.  We were able to successfully  straighten out the subclavian with this dilatation; however, there was still  some residual stenosis at the proximal portion of the stent beyond the  ostia.  For this reason, a second tandem stent was placed.  This was a 6 mm  x 12 mm Genesis on aviator positioned under fluoroscopic control over a  newly placed 0.014-inch stabilizer Cordis guidewire with the wholly wire  removed.  It was deployed at 14-30.  The balloon was then exchanged for an  upgraded 7 mm x 15 mm aviator, and the second stent and overlap was dilated  at 15 atmospheres for 45 seconds.  Final injections were obtained by hand  and demonstrated excellent angiographic result with less than 10% residual  narrowing compared to the post-stenotic area, excellent position just at the  arch into the subclavian from the arch, and the stent ended  well before the  LIMA and left vertebral, which showed good antegrade flow.  The  thyrocervical was widely patent and intact.  There was no dissection and no  residual gradient.  Because of desire to maintain access in the patient's  bilateral claudication, right greater than left, it was elected to proceed  with RCIA stenting.  The long side-arm sheath was positioned just proximal  to the LCIA stenosis with hand injections obtained with ruler markings.  The  lesion in the LCIA was then dilated with an 8 mm x 24 mm pre-mounted Genesis  stent on an Opta balloon.  This was deployed under fluoroscopic control at  10 atmospheres for 45 seconds.  The balloon was then pulled back and final  injections were done through the side-arm sheath.  This demonstrated  excellent position of the stent just at the iliac bifurcation and not  protruding into the aorta.  The lesion was covered and there was zero- percent residual narrowing, good flow and no dissection.   The left common iliac 50% segmental narrowing proximally and 60% or slightly  greater in the mid LCIA was evident.  The dilatation system was removed, the  side-arm sheath was then changed to a short 6-French side-arm sheath pending  ACT measurement and sheath removal with pressure hemostasis.  The patient  tolerated the procedure well and was transferred to the holding area for  observation post procedure.   CATHETERIZATION DIAGNOSES:  1. Successful left subclavian artery PTA and subsequent tandem stenting      for high-grade stenosis for purposes of preservation of LIMA for bypass      surgery.  2. Bilateral claudication, right greater than left, with successful PTA      and stent right common iliac stenosis.  3. High-grade right renal artery stenosis with systemic hypertension.  4. Hyperlipidemia.  5. Cigarette abuse.  6. Coronary artery disease with planned elective CABG, Jun 04, 2004.  7. Status post right carotid endarterectomy with  patch angioplasty, Dr.      Hart Rochester, March 2005, widely patent on this study.        RAW/MEDQ  D:  06/03/2004  T:  06/03/2004  Job:  [id number]  cc:   Salvatore Decent. Dorris Fetch, M.D.  [address]  [place]  [place] [address]   Madaline Savage, M.D.  [phone] [address]  [place]  [place] [address]  Fax: [phone]   Louanna Raw  [address]  [place]  [place] [address]  Fax: [phone]
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
## 2010-06-21 NOTE — Discharge Summary (Signed)
[name], Misty                  ACCOUNT NO.:  [id number]   MEDICAL RECORD NO.:  [id number]          PATIENT TYPE:  INP   LOCATION:  2017                         FACILITY:  [facility]   PHYSICIAN:  Salvatore Decent. Dorris Fetch, M.D.DATE OF BIRTH:  03/29/1950   DATE OF ADMISSION:  06/03/2004  DATE OF DISCHARGE:  06/08/2004                                 DISCHARGE SUMMARY   ADMISSION DIAGNOSES:  1.  Left main and three vessel coronary artery disease.  2.  Left subclavian artery high-grade stenosis.  3.  Bilateral claudication right greater than left with right common iliac      stenosis.   SECONDARY DIAGNOSES:  1.  Left main and three vessel coronary artery disease.  2.  Left subclavian artery high-grade stenosis.  3.  Bilateral claudication right greater that left with right common iliac      stenosis.  4.  High-grade right renal artery stenosis with systemic hypertension.  5.  Hyperlipidemia.  6.  Cigarette abuse, now attempting smoking cessation.  7.  Carotid artery disease, status post right carotid endarterectomy with      Dacron patch angioplasty by Dr. Hart Rochester in March 2005.  8.  History of cholecystectomy.  9.  History of hysterectomy.  10. History of hemorrhoidectomy.  11. Carpal tunnel syndrome.  12. Cervical spondylosis.  13. History of migraines.  14. Multiple drug intolerances including codeine, morphine, possibly      dopamine which causes nausea and vomiting and IVP dye which also causes      nausea.   PROCEDURES:  (1) Jun 03, 2004:  Retrograde central aortic catheterization and  aortic arch angiogram.  LAO projection, selective right brachiocephalic  ejection, left subclavian injection, and transstenotic gradient measurement.  Subclavian PTCA and subsequent tandem balloon  expandable stent, abdominal  aortic angiogram with mid-stream PA projection, bilateral iliac angiogram PA  projection DSA imaging.  (2) Selective right iliac angiogram with subsequent  PTCA stent of  high-grade right CIA stenosis by Dr. Susa Griffins.  (3)  Jun 04, 2004 median sternotomy for coronary artery bypass grafting x3, normal  left internal mammary to LAD, right internal mammary artery to the distal  right coronary, saphenous vein graft to the obtuse margin, endoscopic vein  harvesting from the right thigh.  Surgeon:  Dr. Charlett Lango.   BRIEF HISTORY:  [name] is a 60 year old Caucasian female with history of  atherosclerotic cardiovascular disease.  Recently, she was found to be  profoundly hypertensive.  She was treated and referred to Dr. Elsie Lincoln for  further evaluation.  She underwent a stress Cardiolite which showed anterior  and inferior ischemia.  She subsequently underwent cardiac catheterization  which revealed left main and three vessel coronary artery disease.  She  really had not been having any chest pain or pressure.  She did report that  she gets shortness of breath with exertion and reports that over the past  six months she had been feeling tired and run down, does not have her usual  energy levels.  She does have ongoing tobacco abuse.  Due to her significant  coronary artery disease, Dr. Dorris Fetch felt she would benefit from  coronary artery bypass grafting.  Preoperatively, she was found to have left  subclavian stenosis.  Dr. Dorris Fetch felt that ideally with her young age,  mammary artery was of primary importance and would prefer to use a left  mammary artery pedicle graft.  Therefore, he recommended that she undergo  left subclavian angioplasty prior to surgery.  This was arranged with Dr.  Susa Griffins at [facility].   HOSPITAL COURSE:  On Jun 03, 2004, [name] as electively admitted to [facility] and first underwent successful left subclavian artery PTA and  subsequent tandem stenting for high-grade stenosis for purposes of  preservation of her LIMA for bypass surgery.  She also underwent  successful  PTA and stenting of the right common iliac for stenosis.  Of note, during  the arteriogram, her right carotid endarterectomy site was widely patent.  She tolerated this initial procedure well but did have bleeding from the  groin puncture site which was relieved with pressure.  The following  morning, she was felt stable to proceed with coronary artery bypass graft  surgery as discussed above.  She also was felt to tolerate this procedure  well and was transferred to the surgical intensive care unit postoperatively  in stable condition.  On postoperative day 1, [name] remained  hemodynamically stable and was in sinus rhythm.  She had been extubated;  neurologically intact.  Chest tube output was minimal and chest tubes were  discontinued without incident.  Labs did show mild anemia which was treated  with observation.  She also showed mild fluid volume excess and was started  on short term diuretic therapy.  On postop day 1, she was felt stable to  transfer out of the unit.  Once out of the unit, [name] made good  progression.  She was able to tolerate a regular diet.  Her bowel and  bladder returned to normal function.  Her vitals remained stable and she  remained afebrile.  She maintained sinus rhythm.  She was ultimately weaned  from supplemental oxygen and was saturating above 90%.  She showed good  response to diuretic therapy.  Her postoperative anemia remained improved  and remained stable.  Her creatinine remained at her baseline, between 1.3  and 1.6.  She was started on Plavix for her recent stent.  She was able to  ambulate in the hallways with cardiac rehab and was felt to have a steady  gait.  She did undergo a smoking consult prior to discharge and reiterated  that she was done with smoking.  Her pain was controlled on oral medication.  By Jun 07, 2004, [name] was felt nearly ready for discharge.  She did report some loose stools that was treated with the  discontinuation of her  stool softeners and laxatives, but was otherwise without concern.  She  expressed a desire to go home and said that her sister would be staying with  her on discharge.   On exam, her heart had a regular rate and rhythm.  Her lungs showed just a  few faint crackles in the left base but otherwise clear.  Abdominal exam was  benign.  Extremities showed trace edema and her incisions were healing well  without signs of infection.  Vital signs are stable with blood pressure  111/70, heart rate in the 70s and sinus rhythm.  She remained afebrile.  It  was felt that if there were no significant changes in her status over the  next 24 hours, that she would be ready for discharge home on Jun 08, 2004.   RADIOLOGY:  Chest x-ray on Jun 05, 2004 showed no pneumothorax, persistent  bibasilar atelectasis but no effusion.  No overt pulmonary edema.   LABORATORY DATA:  Recent labs show a hemoglobin of 8.6, hematocrit 24.2,  white blood count 9.2, platelet count 123.  Sodium 137, potassium 4.7,  chloride 105, CO2 25, BUN 25, creatinine 1.5.   DISCHARGE MEDICATIONS:  1.  Aspirin 325 mg p.o. daily.  2.  Toprol-XL 25 mg p.o. daily.  3.  Vytorin 10/40 mg p.o. daily.  4.  Plavix 75 mg p.o. daily.  5.  Wellbutrin 150 mg p.o. daily.  6.  Prilosec 20 mg p.o. daily.  7.  Lunesta 2 mg p.o. q.h.s. p.r.n. for sleep.  8.  Lasix 40 mg p.o. daily x3 days.  9.  K-Dur 20 mEq p.o. daily x3 days.  10. Tylox 1-2 tablets p.o. q.4-6h. p.r.n. pain.   DISCHARGE INSTRUCTIONS:  She is instructed to avoid driving or heavy lifting  more than 10 pounds.  She is encouraged to continue to daily walk and  breathing exercises.  She is to follow a low salt, low fat diet.  She may  shower, clean her incisions gently with mild soap and water.  She should  notify this office if she develops fever greater than 101 or redness or  drainage from her incision sites.   FOLLOW UP:  She is to follow up with Dr.  Dorris Fetch on Jun 27, 2004 at  12:45 p.m.  She is to have a chest x-ray one hour before this appointment at  [facility] and was instructed to bring her chest x-ray with her to  this appointment.  She is to follow up with Dr. Elsie Lincoln at [facility] on 06/27/04 at 2:45 p.m.      AWZ/MEDQ  D:  06/07/2004  T:  06/07/2004  Job:  [id number]   cc:   Madaline Savage, M.D.  [address]  [place]  [place] [address]  Fax: [phone]   Quita Skye. Hart Rochester, M.D.  [address]  [place]  [place] [address]

## 2010-06-21 NOTE — Op Note (Signed)
NAMEJAHANNA, [name]                            ACCOUNT NO.:  [id number]   MEDICAL RECORD NO.:  [id number]                   PATIENT TYPE:  INP   LOCATION:  2899                                 FACILITY:  [facility]   PHYSICIAN:  Quita Skye. Hart Rochester, M.D.               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   OPERATION PERFORMED:  Right carotid endarterectomy with Dacron patch  angioplasty.   SURGEON:  Quita Skye. Hart Rochester, M.D.   ASSISTANT:  Eber Jones A. Eustaquio Boyden.   ANESTHESIA:  General endotracheal.   INDICATIONS FOR PROCEDURE:  This patient was recently found to have a right  carotid bruit and duplex scanning revealed severe right internal carotid  stenosis approximating 95% with moderate disease on the left side.  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  She has had no recurrence of these symptoms in the last 12 months and now is  scheduled for an elective right carotid endarterectomy.   DESCRIPTION OF PROCEDURE:  The patient was taken to the operating room and  placed in supine position at which time satisfactory general endotracheal  anesthesia was administered.  The right neck was prepped with Betadine scrub  and solution and draped in routine sterile manner.  Incision was made along  the anterior border of the sternocleidomastoid muscle and carried down  through subcutaneous tissue and platysma  using the Bovie.  Common facial  vein and external jugular vein were ligated with 3-0 silk ties and divided  exposing the common, internal and external carotid arteries.  Care was taken  not to injure the vagus or hypoglossal nerves, both of which were exposed.  There was a soft atherosclerotic plaque at the carotid bifurcation extending  up the internal carotid artery about 3 cm.  The distal vessel appeared  normal but had no palpable pulse.  A #10 shunt was prepared and the patient  was heparinized.  The carotid vessels were occluded with vascular clamps.  Longitudinal opening made in the common carotid blade with a 15 blade and  extended up the internal carotid with Potts scissors to a point distal to  the disease.  The plaque was severely stenotic at least 90% and was  ulcerative on the posterior wall.  A #10 shunt was inserted without  difficulty re-establishing flow in about two minutes.  A standard  endarterectomy was then performed using the elevator and the Potts scissors  with an eversion endarterectomy of the external carotid.  The plaque  feathered off the distal internal carotid artery nicely not requiring any  tacking sutures.  The lumen was thoroughly irrigated with heparin saline and  all loose debris carefully removed.  The arteriotomy was closed with a patch  using continuous 6-0 Prolene.  Prior to completion of the closure, the shunt  was removed after 30 minutes of shunt time and following antegrade and  retrograde flushing the closure was completed with re-establishment of flow  initially up the external and then up the internal branch.  Carotid was  occluded for less than two minutes for removal of the shunt.  Protamine was then given to reverse the heparin.  Following adequate  hemostasis the wound was irrigated with saline and closed in layers with  Vicryl in the subcuticular fashion.  Sterile dressing applied.  The patient  was taken to the recovery room in  satisfactory condition.                                               Quita Skye Hart Rochester, M.D.    JDL/MEDQ  D:  03/29/2003  T:  03/29/2003  Job:  [id number]

## 2010-06-21 NOTE — Op Note (Signed)
[place]  Patient:    [name], [name] Visit Number: [id number] MRN: [id number]          Service Type: DSU Location: [facility] Attending Physician:  Vikki Ports. Dictated by:   Catalina Lunger, M.D. Proc. Date: 12/30/00 Admit Date:  12/30/2000 Discharge Date: 12/30/2000                             Operative Report  PREOPERATIVE DIAGNOSIS:  Hemorrhoids and anal condyloma.  POSTOPERATIVE DIAGNOSIS:  Hemorrhoids and anal condyloma.  PROCEDURE:  Three quadrant complex hemorrhoidectomy.  SURGEON:  Catalina Lunger, M.D.  ANESTHESIA:  General anesthesia.  DESCRIPTION OF PROCEDURE:  The patient was taken to the operating room and placed in the supine position.  After general anesthesia was induced, the patient was placed in the prone jackknife position.  Rectal and perianal prep were undertaken.  Using 9 cc of Marcaine with 1 cc of Wydase, the right posterior hemorrhoidal bundle was injected.  This perianal skin was grasped with a triangular clamp.  The hemorrhoidal tissue was incised.  A very large 1 to 2 cm condyloma was evident on the anoderm.  This was excised with the hemorrhoidal tissue.  It was incised down to the apex, excising hemorrhoidal tissue off the muscle.  The apex was clamped and the tissue was removed. The defect was closed with a muscle to mucosal interlocking 2-0 chromic suture from the apex to the anoderm.  The anoderm was then closed with a running 3-0 chromic suture.  Right anterior and left lateral hemorrhoidal bundles were excised in the identical fashion.  Gelfoam packing was placed in the perianal space and rectum.  A sterile dressing was applied.  The patient tolerated the procedure well and went to PACU in stable condition. Dictated by:   Catalina Lunger, M.D. Attending Physician:  Danna Hefty R. DD:  01/20/01 TD:  01/20/01 Job: [id number]

## 2010-06-25 ENCOUNTER — Encounter (INDEPENDENT_AMBULATORY_CARE_PROVIDER_SITE_OTHER): Payer: BC Managed Care – PPO

## 2010-06-25 ENCOUNTER — Ambulatory Visit: Payer: BC Managed Care – PPO | Admitting: Vascular Surgery

## 2010-06-25 ENCOUNTER — Ambulatory Visit (INDEPENDENT_AMBULATORY_CARE_PROVIDER_SITE_OTHER): Payer: BC Managed Care – PPO | Admitting: Vascular Surgery

## 2010-06-25 DIAGNOSIS — I70219 Atherosclerosis of native arteries of extremities with intermittent claudication, unspecified extremity: Secondary | ICD-10-CM

## 2010-06-25 DIAGNOSIS — Z48812 Encounter for surgical aftercare following surgery on the circulatory system: Secondary | ICD-10-CM

## 2010-06-25 NOTE — Assessment & Plan Note (Signed)
OFFICE VISIT  LEONILDA, COZBY T DOB:  Apr 26, 1950                                       06/25/2010 ZOXWR#:[id number]  Here for follow-up of her right fem-pop bypass on 03/15/10.  She continues to do well.  She has returned to work.  She is walking without difficulty.  She does have some swelling continuing in her right calf and ankle, which is worse over a long day standing, which she does on her job.  I did explain that this should continue to resolve over time.  I did discuss the option of a graduated compression garment, which she is not interested in at this time.  Today her ankle arm index is 0.98 on the right and 0.90 on the left. She has no evidence of stenosis with a graft scan on her right fem-pop. I am pleased with her result and will see her again in protocol follow- up in 3 months.    Larina Earthly, M.D. Electronically Signed  TFE/MEDQ  D:  06/25/2010  T:  06/25/2010  Job:  [id number]

## 2010-07-03 NOTE — Procedures (Unsigned)
BYPASS GRAFT EVALUATION  INDICATION:  Right lower extremity bypass graft.  HISTORY: Diabetes:  No. Cardiac:  No. Hypertension:  Yes. Smoking:  Yes. Previous Surgery:  Right fem-pop bypass graft on 03/15/2010.  SINGLE LEVEL ARTERIAL EXAM                              RIGHT              LEFT Brachial:                    137                136 Anterior tibial:             124                113 Posterior tibial:            134                123 Peroneal: Ankle/brachial index:        0.98               0.90  PREVIOUS ABI:  Date: 03/26/2010  RIGHT:  0.91  LEFT:  0.81  LOWER EXTREMITY BYPASS GRAFT DUPLEX EXAM:  DUPLEX:  Monophasic Doppler waveforms are noted throughout the right lower extremity bypass graft with no increase in velocities.  IMPRESSION: 1. Patent right femoropopliteal bypass graft with no evidence of     stenosis. 2. Bilateral ankle brachial indices and Doppler waveforms suggest     normal perfusion of the right lower extremity and mildly decreased     perfusion of the left lower extremity.  Stable bilateral ankle     brachial indices noted when compared to the previous examination.  ___________________________________________ Larina Earthly, M.D.  CH/MEDQ  D:  06/27/2010  T:  06/27/2010  Job:  [id number]

## 2010-10-23 ENCOUNTER — Other Ambulatory Visit: Payer: BC Managed Care – PPO

## 2011-01-20 ENCOUNTER — Encounter: Payer: Self-pay | Admitting: Cardiology

## 2012-01-21 ENCOUNTER — Encounter: Payer: Self-pay | Admitting: Vascular Surgery

## 2012-02-16 IMAGING — CT CT ANGIO AOBIFEM WO/W CM
1 of 11 series · 2 of 16 positions shown · IV contrast ([ID] OMNI 350)
Comparison: None.

CLINICAL DATA: 59-year-old with bilateral leg pain and cramping
with walking.  History of coronary artery disease.

CT ANGIOGRAPHY OF ABDOMINAL AORTA WITH ILIOFEMORAL RUNOFF
TECHNIQUE: Multidetector CT imaging of the abdomen, pelvis and
lower extremities was performed using the standard protocol during
bolus administration of intravenous contrast.  Multiplanar CT image
reconstructions including MIPs were obtained to evaluate the
vascular anatomy.
Contrast:  100 ml of Omnipaque 350

[Series 104: cta runoff · axial · 0.74mm/px · z∈[-947,-365]mm · 2 of 448 slices shown]
[im 150/448  soft-tissue]
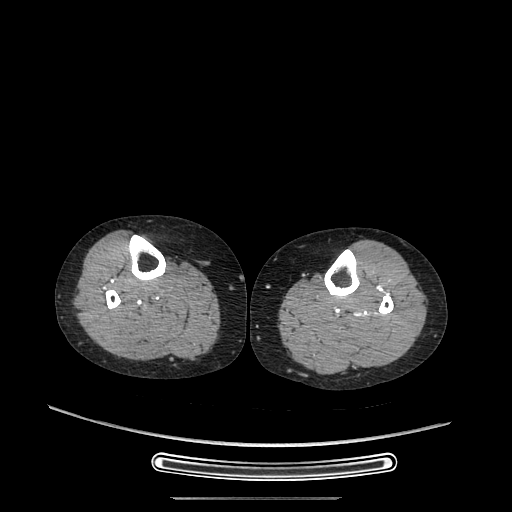
[im 299/448  bone]
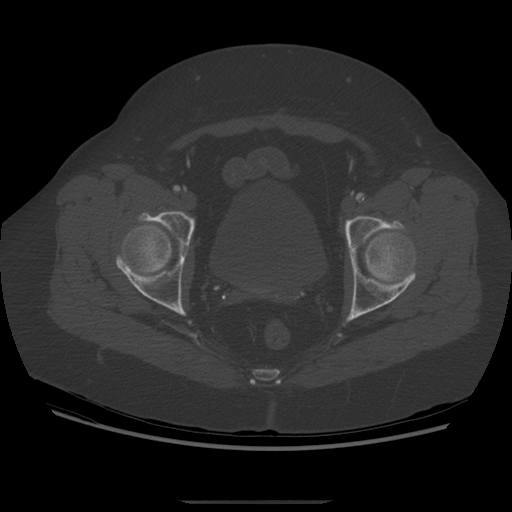

[2 of 16 positions shown; findings below may reference images not displayed]

FINDINGS: Aorta:  The abdominal aorta is patent without aneurysmal
dilatation.  The celiac trunk and SMA are patent.  There is mild
narrowing near the origin of the SMA with a small amount of plaque
in the main descending SMA.  There is a right renal artery stent
which is patent.  There may be mild stenosis at the origin of the
left renal artery which is patent.  The infrarenal abdominal aorta
measures up to 2.5 cm.  There is irregular mural thrombus
throughout the infrarenal abdominal aorta.

Right Lower Extremity:  There is a right common iliac artery stent
at the aortic bifurcation.  Right common iliac artery stent is
patent.  There is plaque and calcifications involving the distal
right common iliac artery.  The right external and internal iliac
arteries are small but patent.  There is plaque involving the right
external iliac artery and mild stenosis near the origin of the
right external iliac artery.

The right common femoral artery is patent.  There is a critical
stenosis involving the proximal right superficial femoral artery.
Superficial femoral artery occludes in the upper thigh.  The
profunda femoral artery is patent.  The right superficial femoral
artery reconstitutes in the mid thigh and the popliteal artery is
patent.  There is moderate stenosis in the right popliteal artery
at the level of the kneecap.  Three vessel runoff in the right
lower leg.  The peroneal artery is the dominant branch.  There is
flow across the ankle from the posterior tibial artery and the
dorsalis pedis artery.

Left Lower Extremity:  The left common iliac artery is heavily
diseased and there is a moderate-severe stenosis in the mid left
common iliac artery.  The left external and internal iliac arteries
are patent.  Plaque involving the left external iliac artery and
left common femoral artery.  There is an early takeoff of the
profunda femoral artery branches.  There is a moderate stenosis of
the proximal left superficial femoral artery.  Left superficial
femoral artery is patent with scattered calcifications.  Three
vessel runoff in the left calf with flow across ankle from the
posterior tibial artery and dorsalis pedis artery.

Nonvascular structures:  Small focus of soft tissue calcifications
medial to the left knee could represent fat necrosis and question
previous injection site.  3 mm noncalcified nodule in the posterior
left lower lobe on sequence 104, image 15.  Small pleural-based
density along the left lower lobe pleural surface on image 6
roughly measures 3 mm.  The gallbladder has been removed.  No gross
abnormality to the liver, spleen, stomach, pancreas and kidneys.
There is a 2.4 cm nodule involving the lateral limb of the right
adrenal gland which is indeterminate.  There is a nodule involving
the lateral limb of the left adrenal gland that measures 2.3 cm.
Small lymph nodes in the gastrohepatic ligament.  The uterus has
been removed.  No gross abnormality to the adnexal tissue.  There
is fluid in the urinary bladder.  Multiple diverticula of the
sigmoid colon without acute inflammation.  High density structures
in the colon probably related to debris or pills.  The appendix has
a normal appearance.  No acute osseous abnormality.  Sclerosis
involving the upper sacrum is likely degenerative.

 Review of the MIP images confirms the above findings.
IMPRESSION: Atherosclerotic disease of the aorta and bilateral lower
extremities.  The irregular plaque  in the infrarenal abdominal
aorta without aneurysmal dilatation.

Severe stenosis in the left common iliac artery.  Mild disease
involving the left SFA and normal left lower extremity runoff.
Incidentally, the patient has an early takeoff of the left profunda
branches.

Occlusion of the proximal right superficial femoral artery with
distal reconstitution.  There is at least mild stenosis involving
the origin of the right external iliac artery.  Normal runoff in
the right lower extremity.

Patent right renal artery stent.

Bilateral adrenal nodules.  Nodules are likely related to adenomas
unless there is a history of malignancy.  However, based on the
size of these nodules, suggest further evaluation with an adrenal
CT or MR.

Two 3 mm nodules in the left lower lobe.  These nodules are
nonspecific. If the patient is at high risk for bronchogenic
carcinoma, follow-up chest CT at 1 year is recommended.  If the
patient is at low risk, no follow-up is needed.  This
recommendation follows the consensus statement: Guidelines for
Management of Small Pulmonary Nodules Detected on CT Scans:  A
Statement from the [HOSPITAL] as published in Radiology
1334; [DATE].  Available online at:
[URL]

## 2012-09-27 ENCOUNTER — Emergency Department (HOSPITAL_COMMUNITY): Payer: Self-pay

## 2012-09-27 ENCOUNTER — Encounter (HOSPITAL_COMMUNITY): Payer: Self-pay | Admitting: *Deleted

## 2012-09-27 ENCOUNTER — Ambulatory Visit (HOSPITAL_COMMUNITY): Admit: 2012-09-27 | Payer: Self-pay | Admitting: Cardiovascular Disease

## 2012-09-27 ENCOUNTER — Encounter (HOSPITAL_COMMUNITY)
Admission: EM | Disposition: A | Discharge status: Home / Self Care | Payer: Self-pay | Source: Home / Self Care | Attending: Interventional Cardiology

## 2012-09-27 ENCOUNTER — Inpatient Hospital Stay (HOSPITAL_COMMUNITY)
Admission: EM | Admit: 2012-09-27 | Discharge: 2012-09-30 | DRG: 248 | Disposition: A | Discharge status: Home / Self Care | Payer: BC Managed Care – PPO | Attending: Interventional Cardiology | Admitting: Interventional Cardiology

## 2012-09-27 DIAGNOSIS — I160 Hypertensive urgency: Secondary | ICD-10-CM

## 2012-09-27 DIAGNOSIS — I219 Acute myocardial infarction, unspecified: Secondary | ICD-10-CM

## 2012-09-27 DIAGNOSIS — Z91199 Patient's noncompliance with other medical treatment and regimen due to unspecified reason: Secondary | ICD-10-CM

## 2012-09-27 DIAGNOSIS — Z9119 Patient's noncompliance with other medical treatment and regimen: Secondary | ICD-10-CM

## 2012-09-27 DIAGNOSIS — I2129 ST elevation (STEMI) myocardial infarction involving other sites: Principal | ICD-10-CM

## 2012-09-27 DIAGNOSIS — I10 Essential (primary) hypertension: Secondary | ICD-10-CM

## 2012-09-27 DIAGNOSIS — I25810 Atherosclerosis of coronary artery bypass graft(s) without angina pectoris: Secondary | ICD-10-CM | POA: Diagnosis present

## 2012-09-27 DIAGNOSIS — Z955 Presence of coronary angioplasty implant and graft: Secondary | ICD-10-CM

## 2012-09-27 DIAGNOSIS — I5041 Acute combined systolic (congestive) and diastolic (congestive) heart failure: Secondary | ICD-10-CM

## 2012-09-27 DIAGNOSIS — F17200 Nicotine dependence, unspecified, uncomplicated: Secondary | ICD-10-CM

## 2012-09-27 DIAGNOSIS — Z598 Other problems related to housing and economic circumstances: Secondary | ICD-10-CM

## 2012-09-27 DIAGNOSIS — E785 Hyperlipidemia, unspecified: Secondary | ICD-10-CM

## 2012-09-27 DIAGNOSIS — I739 Peripheral vascular disease, unspecified: Secondary | ICD-10-CM

## 2012-09-27 DIAGNOSIS — I2510 Atherosclerotic heart disease of native coronary artery without angina pectoris: Secondary | ICD-10-CM

## 2012-09-27 DIAGNOSIS — I213 ST elevation (STEMI) myocardial infarction of unspecified site: Secondary | ICD-10-CM

## 2012-09-27 DIAGNOSIS — Z5987 Material hardship due to limited financial resources, not elsewhere classified: Secondary | ICD-10-CM

## 2012-09-27 HISTORY — PX: LEFT HEART CATHETERIZATION WITH CORONARY ANGIOGRAM: SHX5451

## 2012-09-27 HISTORY — DX: Atherosclerotic heart disease of native coronary artery without angina pectoris: I25.10

## 2012-09-27 LAB — COMPREHENSIVE METABOLIC PANEL
ALT: 23 U/L (ref 0–35)
AST: 24 U/L (ref 0–37)
Alkaline Phosphatase: 97 U/L (ref 39–117)
CO2: 27 mEq/L (ref 19–32)
Calcium: 9.9 mg/dL (ref 8.4–10.5)
GFR calc Af Amer: 64 mL/min — ABNORMAL LOW (ref >90)
GFR calc non Af Amer: 55 mL/min — ABNORMAL LOW (ref >90)
Glucose, Bld: 97 mg/dL (ref 70–99)
Potassium: 4.5 mEq/L (ref 3.5–5.1)
Sodium: 139 mEq/L (ref 135–145)
Total Protein: 7.7 g/dL (ref 6.0–8.3)

## 2012-09-27 LAB — CBC
Hemoglobin: 15.8 g/dL — ABNORMAL HIGH (ref 12.0–15.0)
MCH: 31.2 pg (ref 26.0–34.0)
Platelets: 222 10*3/uL (ref 150–400)
RBC: 5.07 MIL/uL (ref 3.87–5.11)
WBC: 6.7 10*3/uL (ref 4.0–10.5)

## 2012-09-27 LAB — POCT I-STAT TROPONIN I

## 2012-09-27 SURGERY — LEFT HEART CATHETERIZATION WITH CORONARY ANGIOGRAM
Anesthesia: LOCAL

## 2012-09-27 MED ORDER — BIVALIRUDIN 250 MG IV SOLR
INTRAVENOUS | Status: AC
  Filled 2012-09-27: qty 250

## 2012-09-27 MED ORDER — NITROGLYCERIN IN D5W 200-5 MCG/ML-% IV SOLN
5.0000 ug/min | INTRAVENOUS | Status: DC
  Administered 2012-09-27: 5 ug/min via INTRAVENOUS

## 2012-09-27 MED ORDER — ROCURONIUM BROMIDE 50 MG/5ML IV SOLN
INTRAVENOUS | Status: AC
  Filled 2012-09-27: qty 2

## 2012-09-27 MED ORDER — NITROGLYCERIN IN D5W 200-5 MCG/ML-% IV SOLN
INTRAVENOUS | Status: AC
  Filled 2012-09-27: qty 250

## 2012-09-27 MED ORDER — HEPARIN SODIUM (PORCINE) 5000 UNIT/ML IJ SOLN
4000.0000 [IU] | Freq: Once | INTRAMUSCULAR | Status: AC
  Administered 2012-09-27: 4000 [IU] via INTRAVENOUS
  Filled 2012-09-27: qty 1

## 2012-09-27 MED ORDER — LABETALOL HCL 5 MG/ML IV SOLN
20.0000 mg | Freq: Once | INTRAVENOUS | Status: AC
  Administered 2012-09-27: 20 mg via INTRAVENOUS
  Filled 2012-09-27: qty 4

## 2012-09-27 MED ORDER — ETOMIDATE 2 MG/ML IV SOLN
INTRAVENOUS | Status: AC
  Filled 2012-09-27: qty 20

## 2012-09-27 MED ORDER — LIDOCAINE HCL (PF) 1 % IJ SOLN
INTRAMUSCULAR | Status: AC
  Filled 2012-09-27: qty 30

## 2012-09-27 MED ORDER — FENTANYL CITRATE 0.05 MG/ML IJ SOLN
INTRAMUSCULAR | Status: AC
  Filled 2012-09-27: qty 2

## 2012-09-27 MED ORDER — PANTOPRAZOLE SODIUM 40 MG PO TBEC
40.0000 mg | DELAYED_RELEASE_TABLET | Freq: Every day | ORAL | Status: DC
  Administered 2012-09-27 – 2012-09-30 (×4): 40 mg via ORAL
  Filled 2012-09-27 (×4): qty 1

## 2012-09-27 MED ORDER — ONDANSETRON HCL 4 MG/2ML IJ SOLN: 4.0000 mg | Freq: Four times a day (QID) | INTRAMUSCULAR | Status: DC | PRN

## 2012-09-27 MED ORDER — ALPRAZOLAM 0.25 MG PO TABS
0.2500 mg | ORAL_TABLET | Freq: Two times a day (BID) | ORAL | Status: DC | PRN
  Administered 2012-09-27 – 2012-09-28 (×2): 0.25 mg via ORAL
  Filled 2012-09-27 (×2): qty 1

## 2012-09-27 MED ORDER — METOPROLOL TARTRATE 12.5 MG HALF TABLET
12.5000 mg | ORAL_TABLET | Freq: Two times a day (BID) | ORAL | Status: DC
  Administered 2012-09-27 – 2012-09-29 (×4): 12.5 mg via ORAL
  Filled 2012-09-27 (×4): qty 0.5

## 2012-09-27 MED ORDER — SODIUM CHLORIDE 0.9 % IV SOLN
INTRAVENOUS | Status: AC
  Administered 2012-09-27: 22:00:00 via INTRAVENOUS

## 2012-09-27 MED ORDER — NITROGLYCERIN 0.4 MG SL SUBL
0.4000 mg | SUBLINGUAL_TABLET | SUBLINGUAL | Status: DC | PRN
  Administered 2012-09-27 (×2): 0.4 mg via SUBLINGUAL
  Filled 2012-09-27: qty 25

## 2012-09-27 MED ORDER — ASPIRIN 81 MG PO CHEW: 81.0000 mg | CHEWABLE_TABLET | Freq: Every day | ORAL | Status: DC

## 2012-09-27 MED ORDER — SODIUM CHLORIDE 0.9 % IV SOLN
0.2500 mg/kg/h | Freq: Once | INTRAVENOUS | Status: DC
  Filled 2012-09-27 (×2): qty 250

## 2012-09-27 MED ORDER — ASPIRIN EC 81 MG PO TBEC
81.0000 mg | DELAYED_RELEASE_TABLET | Freq: Every day | ORAL | Status: DC
  Administered 2012-09-28 – 2012-09-30 (×3): 81 mg via ORAL
  Filled 2012-09-27 (×3): qty 1

## 2012-09-27 MED ORDER — SUCCINYLCHOLINE CHLORIDE 20 MG/ML IJ SOLN
INTRAMUSCULAR | Status: AC
  Filled 2012-09-27: qty 1

## 2012-09-27 MED ORDER — NITROGLYCERIN 0.2 MG/ML ON CALL CATH LAB
INTRAVENOUS | Status: AC
  Filled 2012-09-27: qty 1

## 2012-09-27 MED ORDER — HEPARIN (PORCINE) IN NACL 2-0.9 UNIT/ML-% IJ SOLN
INTRAMUSCULAR | Status: AC
  Filled 2012-09-27: qty 2000

## 2012-09-27 MED ORDER — ONDANSETRON HCL 4 MG/2ML IJ SOLN
4.0000 mg | Freq: Four times a day (QID) | INTRAMUSCULAR | Status: DC | PRN
  Administered 2012-09-28 (×2): 4 mg via INTRAVENOUS
  Filled 2012-09-27 (×2): qty 2

## 2012-09-27 MED ORDER — ACETAMINOPHEN 325 MG PO TABS: 650.0000 mg | ORAL_TABLET | ORAL | Status: DC | PRN

## 2012-09-27 MED ORDER — SODIUM CHLORIDE 0.9 % IV SOLN
0.2500 mg/kg/h | INTRAVENOUS | Status: AC
  Filled 2012-09-27: qty 250

## 2012-09-27 MED ORDER — ATORVASTATIN CALCIUM 80 MG PO TABS
80.0000 mg | ORAL_TABLET | Freq: Every day | ORAL | Status: DC
  Administered 2012-09-28 – 2012-09-29 (×2): 80 mg via ORAL
  Filled 2012-09-27 (×3): qty 1

## 2012-09-27 MED ORDER — LIDOCAINE HCL (CARDIAC) 20 MG/ML IV SOLN
INTRAVENOUS | Status: AC
  Filled 2012-09-27: qty 5

## 2012-09-27 MED ORDER — ASPIRIN 300 MG RE SUPP
300.0000 mg | RECTAL | Status: AC
  Filled 2012-09-27: qty 1

## 2012-09-27 MED ORDER — TICAGRELOR 90 MG PO TABS
90.0000 mg | ORAL_TABLET | Freq: Two times a day (BID) | ORAL | Status: DC
  Administered 2012-09-28 – 2012-09-30 (×5): 90 mg via ORAL
  Filled 2012-09-27 (×7): qty 1

## 2012-09-27 MED ORDER — ASPIRIN 81 MG PO CHEW: 324.0000 mg | CHEWABLE_TABLET | ORAL | Status: AC

## 2012-09-27 MED ORDER — NITROGLYCERIN 0.4 MG SL SUBL: 0.4000 mg | SUBLINGUAL_TABLET | SUBLINGUAL | Status: DC | PRN

## 2012-09-27 MED ORDER — MIDAZOLAM HCL 2 MG/2ML IJ SOLN
INTRAMUSCULAR | Status: AC
  Filled 2012-09-27: qty 2

## 2012-09-27 MED ORDER — TICAGRELOR 90 MG PO TABS
ORAL_TABLET | ORAL | Status: AC
  Filled 2012-09-27: qty 2

## 2012-09-27 NOTE — ED Provider Notes (Signed)
CSN: [id number]     Arrival date & time 09/27/12  1816 History   First MD Initiated Contact with Patient 09/27/12 1838     Chief Complaint  Patient presents with  . Chest Pain   (Consider location/radiation/quality/duration/timing/severity/associated sxs/prior Treatment) HPI Pt with several days of on and off chest pressure radiating to neck and back which became constant upon waking this AM. Pt has extensive vascular disease including CABG in 2006. She has not been complaint with medications and has not f/u with per cardiologist, Dr Katrinka Blazing due to financial concerns.+SOB with exertion. +bl lower ext edema. Pt took 2 325 mg aspirin earlier today.  Past Medical History  Diagnosis Date  . ASCVD (arteriosclerotic cardiovascular disease)     CABG in 2006   . PVD (peripheral vascular disease)   . Cerebrovascular disease   . Hypertension   . Tobacco abuse   . GERD (gastroesophageal reflux disease)   . H. pylori infection   . Hepatitis   . Diverticulitis   . DJD (degenerative joint disease) of cervical spine   . Migraine headache   . Coronary artery disease    Past Surgical History  Procedure Laterality Date  . Coronary artery bypass graft  2006    RIMA to the RCA, LIMA to the LAD, SVG to the circumflex  . Carotid endarterectomy      Right  . Cholecystectomy    . Abdominal hysterectomy    . Hemorrhoid surgery    . Colonoscopy     Family History  Problem Relation Age of Onset  . Pneumonia Mother   . Coronary artery disease Father   . Heart disease Sister   . Colon cancer Maternal Aunt     Great aunt, rectal  . Diabetes Maternal Uncle   . Heart disease Maternal Uncle   . Breast cancer Maternal Grandmother   . Heart disease Other     Father's side of family   History  Substance Use Topics  . Smoking status: Current Every Day Smoker -- 0.50 packs/day  . Smokeless tobacco: Not on file     Comment: 1/2 pack per day since 2005, 40 pack years  . Alcohol Use: No   OB History   Grav Para Term Preterm Abortions TAB SAB Ect Mult Living                 Review of Systems  Constitutional: Negative for fever and chills.  HENT: Negative for neck pain and neck stiffness.   Respiratory: Positive for chest tightness and shortness of breath. Negative for cough and wheezing.   Cardiovascular: Positive for chest pain and leg swelling. Negative for palpitations.  Gastrointestinal: Negative for nausea, vomiting, abdominal pain and diarrhea.  Musculoskeletal: Negative for back pain.  Skin: Negative for rash and wound.  Neurological: Negative for dizziness, weakness, light-headedness, numbness and headaches.  All other systems reviewed and are negative.    Allergies  Codeine and Morphine  Home Medications   No current outpatient prescriptions on file. BP 171/70  Pulse 50  Temp(Src) 98.7 F (37.1 C) (Oral)  Resp 19  Ht 5\' 8"  (1.727 m)  Wt 213 lb 6.5 oz (96.8 kg)  BMI 32.46 kg/m2  SpO2 97% Physical Exam  Nursing note and vitals reviewed. Constitutional: She is oriented to person, place, and time. She appears well-developed and well-nourished. No distress.  HENT:  Head: Normocephalic and atraumatic.  Mouth/Throat: Oropharynx is clear and moist.  Eyes: EOM are normal. Pupils are equal, round, and  reactive to light.  Neck: Normal range of motion. Neck supple.  Cardiovascular: Normal rate and regular rhythm.   Pulmonary/Chest: Effort normal and breath sounds normal. No respiratory distress. She has no wheezes. She has no rales. She exhibits no tenderness.  Abdominal: Soft. Bowel sounds are normal. She exhibits no distension and no mass. There is no tenderness. There is no rebound and no guarding.  Musculoskeletal: Normal range of motion. She exhibits edema (non-pitting bl edema). She exhibits no tenderness.  Neurological: She is alert and oriented to person, place, and time.  Skin: Skin is warm and dry. No rash noted. No erythema.  Psychiatric: She has a normal mood  and affect. Her behavior is normal.    ED Course  Procedures (including critical care time) Labs Review Labs Reviewed  CBC - Abnormal; Notable for the following:    Hemoglobin 15.8 (*)    All other components within normal limits  COMPREHENSIVE METABOLIC PANEL - Abnormal; Notable for the following:    Total Bilirubin 0.2 (*)    GFR calc non Af Amer 55 (*)    GFR calc Af Amer 64 (*)    All other components within normal limits  PROTIME-INR - Abnormal; Notable for the following:    Prothrombin Time 19.2 (*)    INR 1.67 (*)    All other components within normal limits  APTT - Abnormal; Notable for the following:    aPTT 102 (*)    All other components within normal limits  BASIC METABOLIC PANEL - Abnormal; Notable for the following:    Glucose, Bld 104 (*)    GFR calc non Af Amer 71 (*)    GFR calc Af Amer 82 (*)    All other components within normal limits  HEMOGLOBIN A1C - Abnormal; Notable for the following:    Hemoglobin A1C 5.7 (*)    Mean Plasma Glucose 117 (*)    All other components within normal limits  TROPONIN I - Abnormal; Notable for the following:    Troponin I 2.21 (*)    All other components within normal limits  TROPONIN I - Abnormal; Notable for the following:    Troponin I 4.47 (*)    All other components within normal limits  PRO B NATRIURETIC PEPTIDE - Abnormal; Notable for the following:    Pro B Natriuretic peptide (BNP) 1626.0 (*)    All other components within normal limits  POCT I-STAT TROPONIN I - Abnormal; Notable for the following:    Troponin i, poc 0.58 (*)    All other components within normal limits  TSH  MAGNESIUM  CBC  PROTIME-INR   Imaging Review Dg Chest Portable 1 View  09/27/2012   *RADIOLOGY REPORT*  Clinical Data: Chest pain  PORTABLE CHEST - 1 VIEW  Comparison: 03/13/2010; 11/21/2004  Findings:  Grossly unchanged borderline enlarged cardiac silhouette and mediastinal contours post median sternotomy and CABG.  There is mild  tortuosity of the thoracic aorta.  There is persistent mild eventration of the right hemidiaphragm.  Minimal bibasilar heterogeneous opacities favored to represent atelectasis.  No focal airspace opacity.  No pleural effusion or pneumothorax.  No evidence of edema.  Unchanged bones.  IMPRESSION: Bibasilar atelectasis without acute cardiopulmonary disease.   Original Report Authenticated By: Tacey Ruiz, MD   Date: 09/27/2012  Rate: 89  Rhythm: normal sinus rhythm  QRS Axis: normal  Intervals: normal  ST/T Wave abnormalities: ST depressions inferiorly and ST depressions laterally  Conduction Disutrbances:none  Narrative Interpretation:  Old EKG Reviewed: changes noted   Date: 09/27/2012 1610  Rate: 51  Rhythm: sinus bradycardia  QRS Axis: normal  Intervals: normal  ST/T Wave abnormalities: ST elevations inferiorly  Conduction Disutrbances:none  Narrative Interpretation:   Old EKG Reviewed: changes noted     MDM   Pt given labetalol and after 2nd dose of SL NTG, dropped her HR and BP. She became lethargic. Given IVF, repeated EKG and pacer/defib pads placed. Repeat EKG showed inferior ST elevation. Code STEMI called at 1940. Discussed with Dr Allyson Sabal who asked to have Dr Isabel Caprice see in ED while cath lab is being assembled. HR and BP improved and pt is now at baseline mental status. She still c/o of 4/10 chest pressure. Will load with heparin.     Loren Racer, MD 09/29/12 [phone]

## 2012-09-27 NOTE — ED Notes (Signed)
Pt received 2 Nitro, after the second Nitro, monitor began to beep, pt's friend came out to inform us that her monitor was beeping. Pt's HR had dropped to 40, B/p was 82/52, pt went unresponsive for a moment. Dr. Ranae Palms was called, and at that time pt's Troponin resulted 0.58, reshot a EKG and Dr. Ranae Palms called a code stemi.

## 2012-09-27 NOTE — ED Notes (Signed)
Pt with extensive cardiac hx to ED c/o chest pain off and on x 2 weeks that increased today and accompanied by some nausea and sob.  Hypertensive of 232/144.  C/o substernal pain that radiates to shoulder blades.

## 2012-09-27 NOTE — H&P (Signed)
Admit date: 09/27/2012 Referring Physician [facility] ED Primary Cardiologist Alanda Amass Katrinka Blazing III Chief complaint/reason for admission: Chest pain  HPI: 62 year old woman with prior coronary artery disease and peripheral arterial disease.  She had began having chest discomfort earlier today. she has not been taking her medicines.  She came to the emergency room.  She was given nitroglycerin and she had significant hypotension and bradycardia.  Her blood pressure came up and repeat ECG revealed acute inferior ST elevation.  Her heart rate has stabilized.  She is currently having a mild level of chest discomfort.  She is unsure as to whether this is similar to what she had in the past with her prior heart disease.    PMH:    Past Medical History  Diagnosis Date  . ASCVD (arteriosclerotic cardiovascular disease)     CABG in 2006   . PVD (peripheral vascular disease)   . Cerebrovascular disease   . Hypertension   . Tobacco abuse   . GERD (gastroesophageal reflux disease)   . H. pylori infection   . Hepatitis   . Diverticulitis   . DJD (degenerative joint disease) of cervical spine   . Migraine headache   . Coronary artery disease     PSH:    Past Surgical History  Procedure Laterality Date  . Coronary artery bypass graft  2006    RIMA to the RCA, LIMA to the LAD, SVG to the circumflex  . Carotid endarterectomy      Right  . Cholecystectomy    . Abdominal hysterectomy    . Hemorrhoid surgery    . Colonoscopy      ALLERGIES:   Codeine and Morphine  Prior to Admit Meds:   (Not in a hospital admission) Family HX:    Family History  Problem Relation Age of Onset  . Pneumonia Mother   . Coronary artery disease Father   . Heart disease Sister   . Colon cancer Maternal Aunt     Great aunt, rectal  . Diabetes Maternal Uncle   . Heart disease Maternal Uncle   . Breast cancer Maternal Grandmother   . Heart disease Other     Father's side of family   Social HX:    History   Social  History  . Marital Status: Single    Spouse Name: N/A    Number of Children: N/A  . 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 Smoking status: Current Every Day Smoker -- 0.50 packs/day  . Smokeless tobacco: Not on file     Comment: 1/2 pack per day since 2005, 40 pack years  . Alcohol Use: No  . Drug Use: No  . Sexual Activity: Not on file   Other Topics Concern  . Not on file   Social History Narrative   Divorced, no children     ROS:  All 11 ROS were addressed and are negative except what is stated in the HPI  PHYSICAL EXAM Filed Vitals:   09/27/12 2000  BP: 176/108  Pulse: 65  Temp:   Resp: 18   General: Well developed, well nourished, in no acute distress Head: Eyes PERRLA, No xanthomas.   Normal cephalic and atramatic  Lungs:   Clear bilaterally to auscultation and percussion. Heart:  HRRR S1 S2  Abdomen: Bowel sounds are positive, abdomen soft and non-tender without masses or  Hernia's noted. Msk:  Back normal, normal gait. Normal strength and tone for age. Extremities:  No edema.  Left PT 2+; left common femoral, 1+ Neuro: Alert and oriented X 3. Psych:  Normal affect, responds appropriately   Labs:   Lab Results  Component Value Date   WBC 6.7 09/27/2012   HGB 15.8* 09/27/2012   HCT 45.1 09/27/2012   MCV 89.0 09/27/2012   PLT 222 09/27/2012    Recent Labs Lab 09/27/12 1831  NA 139  K 4.5  CL 102  CO2 27  BUN 16  CREATININE 1.06  CALCIUM 9.9  PROT 7.7  BILITOT 0.2*  ALKPHOS 97  ALT 23  AST 24  GLUCOSE 97   No results found for this basename: CKTOTAL, CKMB, CKMBINDEX, TROPONINI   No results found for this basename: PTT   Lab Results  Component Value Date   INR 0.87 03/13/2010     No results found for this basename: CHOL   No results found for this basename: HDL   No results found for this basename: LDLCALC   No results found for this  basename: TRIG   No results found for this basename: CHOLHDL   No results found for this basename: LDLDIRECT      Radiology:  @[id number] @  EKG:  Normal sinus rhythm, inferior ST elevation with ST depression in V1 and V2  ASSESSMENT: Acute inferoposterior MI, coronary artery disease, peripheral vascular disease  PLAN:  Patient will be brought to the Cath Lab for emergent catheterization with Dr. Allyson Sabal.  We'll need to stress the importance of medication compliance.  She has been off of her blood pressure medicines per the ER physician.  She did take Plavix in the past after stents in both iliac vessels.  Will manage blood pressure as needed post cath.  Would not give anything now given that she may need a right coronary intervention.  She will need beta blocker and statin restarted post procedure.    Corky Crafts., MD  09/27/2012  8:17 PM

## 2012-09-27 NOTE — H&P (Signed)
.    Pt was reexamined and existing H & P reviewed. No changes found.  [name], MD [facility] 09/27/2012 9:25 PM

## 2012-09-27 NOTE — Progress Notes (Signed)
Received a page for code stemi to Pad A room 11.patient is going to be taken to cath lab as soon as team arrives. Patient has no family here. Called her nephew to inform that she was being taken upstairs and that she would have a procedure done.

## 2012-09-27 NOTE — ED Notes (Signed)
Pt states she has been having intermittent cp for about 2wks. Pt states the pain started in her central chest and radiated to left arm and back. Pt rated pain 8/10, described it as pressure. Pt denies any n/v with pain but did have some diaphoresis and dizziness.

## 2012-09-27 NOTE — CV Procedure (Signed)
[name] is a 62 y.o. female    [id number] LOCATION:  FACILITY: [facility]  PHYSICIAN: Nanetta Batty, M.D. Jul 07, 1950   DATE OF PROCEDURE:  09/27/2012  DATE OF DISCHARGE:   CARDIAC CATHETERIZATION [place]    History obtained from chart review.[name] is a 62 year old Caucasian female patient of Dr. Erskine Emery Smith's history of CAD status post coronary artery bypass grafting in 2006 with LIMA to her LAD, RIMA to RCA and a vein graft to her obtuse marginal branch. Her cardiac catheterization showed left main/three-vessel disease. She has a history of peripheral vascular disease status post endarterectomy, right femoropopliteal bypass grafting and bilateral iliac stenting. She has a history of hypertension and continued tobacco abuse but she has not taken her medications for quite some time or seen a cardiologist because of financial constraints. She is complaining of chest pain off and on for last 2 weeks worse today. She presented to [facility] emergency room where she had inferior ST segment elevation and reciprocal anterior depression. She was seen by the ER physician and Dr. Everette Rank who both agree this was a STEMI. The patient was brought to the Cath Lab for emergency catheterization and intervention.   PROCEDURE DESCRIPTION:    The patient was brought to the second floor [facility] cath lab in the postabsorptive state. She was premedicated with IV Versed and fentanyl. Her left groinwas prepped and shaved in usual sterile fashion. Xylocaine 1% was used for local anesthesia. A 6 French sheath was inserted into the left common femoral artery using standard Seldinger technique. 6 French right and left Judkins diagnostic catheters h French pigtail catheter and IMA catheters were used for selective coronary angiography, selective right and left internal mammary artery angiography and vein graft angiography, left ventriculography and distal abdominal aortography. Visipaque dye was used  for the entirety of the case. Retrograde aorta, left ventricular and pullback pressures were recorded.   HEMODYNAMICS:    AO SYSTOLIC/AO DIASTOLIC: 196/107   LV SYSTOLIC/LV DIASTOLIC: 193/32  ANGIOGRAPHIC RESULTS:   1. Left main; 60% diffuse  2. LAD; 50% proximal and mid with competitive flow from the IMA 3. Left circumflex; percent proximal AV groove, occluded second obtuse marginal branch. There was a moderate sized ramus branch as well.  4. Right coronary artery; dominant with 90% stenosis in the first bend and percent stenosis the stenosis after the acute marginal branch 5.LIMA TO LAD; widely patent as was the left subclavian artery stent 6. RIMA TO RCA was subselectively visualized and widely patent     SVG TO the circumflex obtuse marginal branch had a 99% stenosis in the distal third with TIMI 2 flow 7. Left ventriculography; RAO left ventriculogram was performed using  25 mL of Visipaque dye at 12 mL/second. 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 We'll proceed with PCI and stenting. I will use the paramedics had given her financial constraints, Angiomax and Brilenta.  Procedure description: Using a 6 French left bypass graft guide catheter along with a 1/190 cm long Pro water guidewire into place performed with a 20 by 12 mm long balloon. I attempted to put a spider distal protection device down but there was not enough room distal to the lesion and proximal to the distal anastomosis and therefore I pinned the distal protection. I then stented with a 4.0 mm x  16 mm long level there metal stent and deployed at 2 atmospheres (4.1 mm) resulting reduction and 99% stenosis to 0% residual with TIMI 3 flow. A total of 195 cc of contrast was administered to the patient. The portable and post-at 35  minutes.  Final impression: Successful PCI and stenting of a subtotally occluded circumflex obtuse marginal branch vein graft with a bare-metal stent, Angiomax and Brilenta. The door to balloon time  was 35 minutes. I will continue Angiomax  for 4 hours a reduced dose. She'll be treated with aspirin, Brilenta, beta blocker, ACE inhibitor, and statin drug. She'll need a case manager consult for medication assistance and tobacco cessation as well.  Misty Gess MD, [facility] 09/27/2012 9:30 PM

## 2012-09-27 NOTE — ED Notes (Signed)
Cardiology at bedside at this time

## 2012-09-27 NOTE — ED Notes (Signed)
To Cath lab 

## 2012-09-28 LAB — MAGNESIUM: Magnesium: 1.7 mg/dL (ref 1.5–2.5)

## 2012-09-28 LAB — CBC
Platelets: 194 10*3/uL (ref 150–400)
RBC: 4.34 MIL/uL (ref 3.87–5.11)
RDW: 14.1 % (ref 11.5–15.5)
WBC: 7.1 10*3/uL (ref 4.0–10.5)

## 2012-09-28 LAB — TSH: TSH: 1.332 u[IU]/mL (ref 0.350–4.500)

## 2012-09-28 LAB — BASIC METABOLIC PANEL
Chloride: 107 mEq/L (ref 96–112)
GFR calc Af Amer: 82 mL/min — ABNORMAL LOW (ref >90)
GFR calc non Af Amer: 71 mL/min — ABNORMAL LOW (ref >90)
Glucose, Bld: 104 mg/dL — ABNORMAL HIGH (ref 70–99)
Potassium: 3.9 mEq/L (ref 3.5–5.1)
Sodium: 141 mEq/L (ref 135–145)

## 2012-09-28 LAB — PROTIME-INR
INR: 1.67 — ABNORMAL HIGH (ref 0.00–1.49)
INR: 1.07 (ref 0.00–1.49)
Prothrombin Time: 19.2 seconds — ABNORMAL HIGH (ref 11.6–15.2)

## 2012-09-28 LAB — PRO B NATRIURETIC PEPTIDE: Pro B Natriuretic peptide (BNP): 1626 pg/mL — ABNORMAL HIGH (ref 0–125)

## 2012-09-28 LAB — TROPONIN I: Troponin I: 4.47 ng/mL (ref <0.30)

## 2012-09-28 LAB — HEMOGLOBIN A1C: Mean Plasma Glucose: 117 mg/dL — ABNORMAL HIGH (ref <117)

## 2012-09-28 MED ORDER — ATROPINE SULFATE 1 MG/ML IJ SOLN
INTRAMUSCULAR | Status: AC
  Filled 2012-09-28: qty 1

## 2012-09-28 MED ORDER — HYDRALAZINE HCL 20 MG/ML IJ SOLN
10.0000 mg | INTRAMUSCULAR | Status: DC | PRN
  Administered 2012-09-28: 10 mg via INTRAVENOUS
  Filled 2012-09-28: qty 1

## 2012-09-28 MED ORDER — LISINOPRIL 5 MG PO TABS
5.0000 mg | ORAL_TABLET | Freq: Every day | ORAL | Status: DC
  Administered 2012-09-28 – 2012-09-29 (×2): 5 mg via ORAL
  Filled 2012-09-28 (×2): qty 1

## 2012-09-28 MED FILL — Sodium Chloride IV Soln 0.9%: INTRAVENOUS | Qty: 50 | Status: AC

## 2012-09-28 NOTE — Progress Notes (Signed)
Patient was very anxious prior to sheath pull. Xanax given at that time. BP down enough to pull sheath at 0555. Pressure held for 30 minutes. Patient became hypotensive during the sheath pull. Nitro gtt was turned off. She was given a 250 saline bolus. She felt nauseated during that time. Nausea improved as BP started to rise. Left groin level "0". Pedal pulses 1+ back to baseline. Post sheath pull instructions given to patient and she was able to verbalize understanding. Pressure dressing applied. Please see flow sheet for vital signs.

## 2012-09-28 NOTE — Progress Notes (Signed)
CARDIAC REHAB PHASE I   PRE:  Rate/Rhythm: 80 SR    BP: sitting 163/83 1 hr ago    SaO2: 98 RA  MODE:  Ambulation: 150 ft   POST:  Rate/Rhythm: 90 SR    BP: sitting 147/63     SaO2: 98 RA  Pt up moving in room upon arrival. To bathroom, getting items in drawer. Walked short distance. Pt feeling good without c/o. Ed completed and pt interested in [facility] in [place]. Will send referral and gave financial assist application. Motivated to quit smoking and make changes. Good reception. [phone]   [name] [place] CES, ACSM 09/28/2012 2:42 PM

## 2012-09-28 NOTE — Progress Notes (Signed)
Spoke to Dr. Terressa Koyanagi regarding high BP for pulling sheath. New orders received for BP medications to help with decreased chances of re-bleeding after sheath removed.

## 2012-09-28 NOTE — Care Management Note (Addendum)
  Page 2 of 2   09/30/2012     10:33:28 AM   CARE MANAGEMENT NOTE 09/30/2012  Patient:  [name], [name]   Account Number:  [id number]  Date Initiated:  09/28/2012  Documentation initiated by:  Junius Creamer  Subjective/Objective Assessment:   adm w mi     Action/Plan:   lives w sister, pcp dr lawrence fusco   Anticipated DC Date:     Anticipated DC Plan:        DC Planning Services  CM consult  Medication Assistance      Choice offered to / List presented to:             Status of service:  Completed, signed off Medicare Important Message given?   (If response is "NO", the following Medicare IM given date fields will be blank) Date Medicare IM given:   Date Additional Medicare IM given:    Discharge Disposition:  HOME/SELF CARE  Per UR Regulation:  Reviewed for med. necessity/level of care/duration of stay  If discussed at Long Length of Stay Meetings, dates discussed:    Comments:   09-30-12 [address], [place] [phone] CM did speak to pt in reference to medication assistance. Pt works here in [employer] and lives in [place]. She uses Psychologist, forensic on [address]. CM did call [facility] for prices. Lipitor is now 30.00 generic and price for nicotine patch is 17.60 other 2 meds will be $4.00. Pt states she will be able to afford. No assistance from CM with meds at this time. CM did discuss the options to go the [facility] for medication assistance/PCP and she stated she really did not want to go to the [facility]. Pt states she will f/u with Dr. Katrinka Blazing. CM did call Dr. Katrinka Blazing in reference to Brilinta- he was in a procedure and RN clarified that pt will only need Brilinta for 30 days and not 6 months to a year. Pt will f/u outpatient with MD Katrinka Blazing. No further needs from CM at this time.   8/26 1155a debbie dowell rn,bsn spoke w pt. no ins at present. gave pt brilinta 30day free and copay assist card. left pt assist form for brilinta on chart and asked pt to be sure  she gets form and mails in w proof of income to get med from drug company.

## 2012-09-28 NOTE — Progress Notes (Signed)
Patient Name: [name] Date of Encounter: 09/28/2012    SUBJECTIVE:she is not having chest pain. She continues to smoke. She is living from paycheck to paycheck and can't afford her medications. She was disability from work.  TELEMETRY:  Normal sinus rhythm: Filed Vitals:   09/28/12 0625 09/28/12 0630 09/28/12 0700 09/28/12 0800  BP: 100/78 141/68 137/64 143/78  Pulse: 65 64 51 53  Temp:    98.7 F (37.1 C)  TempSrc:    Oral  Resp: 19 17 16 15   Height:      Weight:      SpO2: 98% 99% 99% 99%    Intake/Output Summary (Last 24 hours) at 09/28/12 0907 Last data filed at 09/28/12 0630  Gross per 24 hour  Intake 635.48 ml  Output   1850 ml  Net -1214.52 ml    LABS: Basic Metabolic Panel:  Recent Labs  16/10/96 1831 09/28/12 0435  NA 139 141  K 4.5 3.9  CL 102 107  CO2 27 24  GLUCOSE 97 104*  BUN 16 13  CREATININE 1.06 0.86  CALCIUM 9.9 8.8  MG  --  1.7   CBC:  Recent Labs  09/27/12 1831 09/28/12 0435  WBC 6.7 7.1  HGB 15.8* 13.5  HCT 45.1 38.3  MCV 89.0 88.2  PLT 222 194   Cardiac Enzymes:  Recent Labs  09/27/12 2250  TROPONINI 2.21*   Coronary angiography reviewed  Radiology/Studies:  No CHF on chest x-ray  Physical Exam: Blood pressure 143/78, pulse 53, temperature 98.7 F (37.1 C), temperature source Oral, resp. rate 15, height 5\' 8"  (1.727 m), weight 96.8 kg (213 lb 6.5 oz), SpO2 99.00%. Weight change:    S4 gallop  ASSESSMENT:  1. 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 Treated with bare-metal stent and very nice result obtained.  2. Medication noncompliance and financial distress  3. Severe hypertension  Plan:  1. Medical therapy for blood pressure will be reinstituted using generics  2. Statin therapy, aspirin, and Brilinta for one month  3. Cardiac rehabilitation  Signed, Lesleigh Noe 09/28/2012, 9:07 AM

## 2012-09-29 DIAGNOSIS — I5041 Acute combined systolic (congestive) and diastolic (congestive) heart failure: Secondary | ICD-10-CM | POA: Diagnosis present

## 2012-09-29 DIAGNOSIS — I160 Hypertensive urgency: Secondary | ICD-10-CM | POA: Diagnosis present

## 2012-09-29 DIAGNOSIS — I2129 ST elevation (STEMI) myocardial infarction involving other sites: Secondary | ICD-10-CM | POA: Diagnosis present

## 2012-09-29 LAB — POCT ACTIVATED CLOTTING TIME: Activated Clotting Time: 406 seconds

## 2012-09-29 MED ORDER — FUROSEMIDE 10 MG/ML IJ SOLN
40.0000 mg | Freq: Once | INTRAMUSCULAR | Status: AC
  Administered 2012-09-29: 40 mg via INTRAVENOUS
  Filled 2012-09-29: qty 4

## 2012-09-29 MED ORDER — LISINOPRIL 10 MG PO TABS
10.0000 mg | ORAL_TABLET | Freq: Every day | ORAL | Status: DC
  Administered 2012-09-29 – 2012-09-30 (×2): 10 mg via ORAL
  Filled 2012-09-29 (×2): qty 1

## 2012-09-29 MED ORDER — METOPROLOL TARTRATE 25 MG PO TABS
25.0000 mg | ORAL_TABLET | Freq: Two times a day (BID) | ORAL | Status: DC
  Administered 2012-09-30: 25 mg via ORAL
  Filled 2012-09-29 (×3): qty 1

## 2012-09-29 NOTE — Progress Notes (Signed)
CARDIAC REHAB PHASE I   PRE:  Rate/Rhythm: 87 SR    BP: sitting 165/89    SaO2:   MODE:  Ambulation: 700 ft   POST:  Rate/Rhythm: 102 ST    BP: sitting 168/78     SaO2:   Tolerated well. Feels good. BP high but stable. Ed completed. Pt ready to change. Encouraged to walk more today. [id number]   Elissa Lovett [place] CES, ACSM 09/29/2012 10:33 AM

## 2012-09-29 NOTE — Progress Notes (Addendum)
Patient ID: [name], female   DOB: 02/09/1950, 62 y.o.   MRN: [id number] Patient Name: [name] Date of Encounter: 09/29/2012    SUBJECTIVE: Dyspnea on exertion similar to usual. No chest pain. There is lower extremity edema.  TELEMETRY:  NSR/SB: Filed Vitals:   09/29/12 0000 09/29/12 0200 09/29/12 0400 09/29/12 0600  BP: 139/78 162/88 154/71 171/70  Pulse: 52 76 68 50  Temp: 98.5 F (36.9 C)  98.7 F (37.1 C)   TempSrc: Oral  Oral   Resp: 19 20 19 19   Height:      Weight:      SpO2: 97% 98% 96% 97%    Intake/Output Summary (Last 24 hours) at 09/29/12 0850 Last data filed at 09/29/12 0600  Gross per 24 hour  Intake   1560 ml  Output   1100 ml  Net    460 ml    LABS: Basic Metabolic Panel:  Recent Labs  13/24/40 1831 09/28/12 0435  NA 139 141  K 4.5 3.9  CL 102 107  CO2 27 24  GLUCOSE 97 104*  BUN 16 13  CREATININE 1.06 0.86  CALCIUM 9.9 8.8  MG  --  1.7   CBC:  Recent Labs  09/27/12 1831 09/28/12 0435  WBC 6.7 7.1  HGB 15.8* 13.5  HCT 45.1 38.3  MCV 89.0 88.2  PLT 222 194   Cardiac Enzymes:  Recent Labs  09/27/12 2250 09/28/12 1025  TROPONINI 2.21* 4.47*   BNP (last 3 results)  Recent Labs  09/27/12 2250  PROBNP 1626.0*    Radiology/Studies:  No new  Physical Exam: Blood pressure 171/70, pulse 50, temperature 98.7 F (37.1 C), temperature source Oral, resp. rate 19, height 5\' 8"  (1.727 m), weight 96.8 kg (213 lb 6.5 oz), SpO2 97.00%. Weight change:    Chest clear S4 Gallop Unable to fully assess neck veins but appear mildly elevated Trace to 1+ peripheral edema.  ASSESSMENT:  1. Acute lateral wall STEMI, treated with BMS and resolution of pain. 2. Acute diastolic heart failure 3. Severe hypertension aggravated by med non compliance 4. Financial difficulty preventing medication purchase 5. Tobacco abuse  Plan:  1. Transfer to floor 2. Case manager 3. Cardiac rehab 4. Titrate appropriate post MI therapy and BP  meds.   Time spent > 40 minutes Selinda Eon 09/29/2012, 8:50 AM

## 2012-09-30 LAB — BASIC METABOLIC PANEL
CO2: 23 mEq/L (ref 19–32)
Calcium: 9.1 mg/dL (ref 8.4–10.5)
Creatinine, Ser: 1.2 mg/dL — ABNORMAL HIGH (ref 0.50–1.10)
GFR calc Af Amer: 55 mL/min — ABNORMAL LOW (ref >90)
GFR calc non Af Amer: 47 mL/min — ABNORMAL LOW (ref >90)
Sodium: 139 mEq/L (ref 135–145)

## 2012-09-30 LAB — PRO B NATRIURETIC PEPTIDE: Pro B Natriuretic peptide (BNP): 1028 pg/mL — ABNORMAL HIGH (ref 0–125)

## 2012-09-30 MED ORDER — ATORVASTATIN CALCIUM 40 MG PO TABS: 40.0000 mg | ORAL_TABLET | Freq: Every day | ORAL | Status: DC

## 2012-09-30 MED ORDER — LISINOPRIL 10 MG PO TABS: 10.0000 mg | ORAL_TABLET | Freq: Every day | ORAL | Status: DC

## 2012-09-30 MED ORDER — TICAGRELOR 90 MG PO TABS: 90.0000 mg | ORAL_TABLET | Freq: Two times a day (BID) | ORAL | Status: DC

## 2012-09-30 MED ORDER — METOPROLOL TARTRATE 25 MG PO TABS
25.0000 mg | ORAL_TABLET | Freq: Two times a day (BID) | ORAL | Status: DC
Start: 2012-09-30 — End: 2013-04-17

## 2012-09-30 MED ORDER — NITROGLYCERIN 0.4 MG SL SUBL: 0.4000 mg | SUBLINGUAL_TABLET | SUBLINGUAL | Status: DC | PRN

## 2012-09-30 MED ORDER — LISINOPRIL-HYDROCHLOROTHIAZIDE 10-12.5 MG PO TABS: 1.0000 | ORAL_TABLET | Freq: Every day | ORAL | Status: DC

## 2012-09-30 NOTE — Discharge Summary (Addendum)
Patient ID: [name] MRN: [id number] DOB/AGE: 04-21-1950 62 y.o.  Admit date: 09/27/2012 Discharge date: 09/30/2012  Patient Active Problem List   Diagnosis Date Noted  . ST elevation myocardial infarction (STEMI) of lateral wall 09/29/2012    Priority: High    Class: Acute  . Acute combined systolic and diastolic heart failure 09/29/2012    Priority: High    Class: Acute  . Hypertensive urgency 09/29/2012    Priority: High    Class: Chronic  . CEREBROVASCULAR DISEASE-RIGHT CEA 11/30/2009  . PERIPHERAL VASCULAR DISEASE 11/30/2009  . HYPERLIPIDEMIA 10/19/2009  . TOBACCO ABUSE 10/19/2009  . GASTROESOPHAGEAL REFLUX DISEASE 10/19/2009  . DIVERTICULAR DISEASE 10/19/2009  . ATHEROSCLEROTIC CARDIOVASCULAR DISEASE 10/18/2009   Primary Discharge Diagnosis: Acute lateral wall STEMI  Secondary Discharge Diagnosis: Hypertension, urgency/emergency  Acute combined systolic and diastolic heart failure, resolved with diuresis  Coronary artery disease with prior coronary bypass grafting  Cerebrovascular disease with prior right carotid endarterectomy  Peripheral arterial disease with prior stenting of iliacs and femoropopliteal bypass on the right  Tobacco abuse  Significant Diagnostic Studies: Emergency catheterization with bare-metal stent implantation 09/27/12 by Dr. Nanetta Batty. 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 She has been without healthcare for several months to greater than a year. Unable to afford medications, she has gone without. She presented with the sudden on set of severe chest pain. Catheterization demonstrated occlusion of the saphenous vein graft to the obtuse marginal. He was treated as a Stamey with bare-metal stent to the graft by Dr. Gery Pray. Her hospital course  has been uneventful.  Shortness of breath and elevated troponins suggested acute diastolic heart failure. Catheterization demonstrated LVEF to be 45- 50%. She received one dose of IV Lasix with good diuresis. Her creatinine 2 days post catheterization was 1.2 from a baseline of 0. 86. Dyspnea resolved with the diuretic. Blood pressure came under control with reinstitution of therapy.  She is at high risk for future events due to financial circumstances. Case management was consult at to help inform the patient of means for medication support   Discharge Exam: Blood pressure 133/66, pulse 54, temperature 98.4 F (36.9 C), temperature source Oral, resp. rate 18, height 5\' 8"  (1.727 m), weight 96.8 kg (213 lb 6.5 oz), SpO2 97.00%.    Left groin cath site with small ecchymosis. No hematoma.  Lungs clear to auscultation and percussion.  S4 gallop on exam.  Neurological exam is intact  Labs:   Lab Results  Component Value Date   WBC 7.1 09/28/2012   HGB 13.5 09/28/2012   HCT 38.3 09/28/2012   MCV 88.2 09/28/2012   PLT 194 09/28/2012    Recent Labs Lab 09/27/12 1831  09/30/12 0410  NA 139  < > 139  K 4.5  < > 3.6  CL 102  < > 103  CO2 27  < > 23  BUN 16  < > 25*  CREATININE 1.06  < > 1.20*  CALCIUM 9.9  < > 9.1  PROT 7.7  --   --   BILITOT 0.2*  --   --   ALKPHOS 97  --   --   ALT 23  --   --   AST 24  --   --   GLUCOSE 97  < > 92  < > = values  in this interval not displayed. Lab Results  Component Value Date   TROPONINI 4.47* 09/28/2012       Radiology: No CHF noted EKG: Discharge EKGs revealed precordial T-wave inversion  FOLLOW UP PLANS AND APPOINTMENTS     Discharge Orders   Future Orders Complete By Expires   Amb Referral to Cardiac Rehabilitation  As directed        Medication List         aspirin 81 MG tablet  Take 81 mg by mouth daily.     atorvastatin 40 MG tablet  Commonly known as:  LIPITOR  Take 1 tablet (40 mg total) by mouth daily at 6 PM.      lisinopril-hydrochlorothiazide 10-12.5 MG per tablet  Commonly known as:  PRINZIDE  Take 1 tablet by mouth daily.     metoprolol tartrate 25 MG tablet  Commonly known as:  LOPRESSOR  Take 1 tablet (25 mg total) by mouth 2 (two) times daily.     nitroGLYCERIN 0.4 MG SL tablet  Commonly known as:  NITROSTAT  Place 1 tablet (0.4 mg total) under the tongue every 5 (five) minutes x 3 doses as needed for chest pain.     omeprazole 20 MG capsule  Commonly known as:  PRILOSEC  Take 20 mg by mouth daily.     Ticagrelor 90 MG Tabs tablet  Commonly known as:  BRILINTA  Take 1 tablet (90 mg total) by mouth 2 (two) times daily.       Follow-up Information   Follow up with Lesleigh Noe, MD On 10/13/2012. [phone])    Specialty:  Cardiology   Contact information:   [address] [place] [address] [phone]       BRING ALL MEDICATIONS WITH YOU TO FOLLOW UP APPOINTMENTS  Time spent with patient to include physician time: 20 minutes Signed: Lesleigh Noe 09/30/2012, 9:33 AM

## 2013-01-18 ENCOUNTER — Ambulatory Visit: Payer: Self-pay | Admitting: Interventional Cardiology

## 2013-04-16 ENCOUNTER — Inpatient Hospital Stay (HOSPITAL_COMMUNITY)
Admission: EM | Admit: 2013-04-16 | Discharge: 2013-04-20 | DRG: 249 | Disposition: A | Discharge status: Home / Self Care | Payer: Self-pay | Attending: Cardiovascular Disease | Admitting: Cardiovascular Disease

## 2013-04-16 DIAGNOSIS — I25810 Atherosclerosis of coronary artery bypass graft(s) without angina pectoris: Secondary | ICD-10-CM | POA: Diagnosis present

## 2013-04-16 DIAGNOSIS — I2582 Chronic total occlusion of coronary artery: Secondary | ICD-10-CM | POA: Diagnosis present

## 2013-04-16 DIAGNOSIS — I5040 Unspecified combined systolic (congestive) and diastolic (congestive) heart failure: Secondary | ICD-10-CM | POA: Diagnosis present

## 2013-04-16 DIAGNOSIS — I2119 ST elevation (STEMI) myocardial infarction involving other coronary artery of inferior wall: Principal | ICD-10-CM | POA: Diagnosis present

## 2013-04-16 DIAGNOSIS — Z79899 Other long term (current) drug therapy: Secondary | ICD-10-CM

## 2013-04-16 DIAGNOSIS — Z7982 Long term (current) use of aspirin: Secondary | ICD-10-CM

## 2013-04-16 DIAGNOSIS — I213 ST elevation (STEMI) myocardial infarction of unspecified site: Secondary | ICD-10-CM

## 2013-04-16 DIAGNOSIS — E785 Hyperlipidemia, unspecified: Secondary | ICD-10-CM | POA: Diagnosis present

## 2013-04-16 DIAGNOSIS — T82897A Other specified complication of cardiac prosthetic devices, implants and grafts, initial encounter: Secondary | ICD-10-CM | POA: Diagnosis present

## 2013-04-16 DIAGNOSIS — I5041 Acute combined systolic (congestive) and diastolic (congestive) heart failure: Secondary | ICD-10-CM

## 2013-04-16 DIAGNOSIS — I160 Hypertensive urgency: Secondary | ICD-10-CM

## 2013-04-16 DIAGNOSIS — Y832 Surgical operation with anastomosis, bypass or graft as the cause of abnormal reaction of the patient, or of later complication, without mention of misadventure at the time of the procedure: Secondary | ICD-10-CM | POA: Diagnosis present

## 2013-04-16 DIAGNOSIS — F17200 Nicotine dependence, unspecified, uncomplicated: Secondary | ICD-10-CM | POA: Diagnosis present

## 2013-04-16 DIAGNOSIS — I252 Old myocardial infarction: Secondary | ICD-10-CM

## 2013-04-16 DIAGNOSIS — I2510 Atherosclerotic heart disease of native coronary artery without angina pectoris: Secondary | ICD-10-CM | POA: Diagnosis present

## 2013-04-16 DIAGNOSIS — I509 Heart failure, unspecified: Secondary | ICD-10-CM | POA: Diagnosis present

## 2013-04-16 DIAGNOSIS — Z955 Presence of coronary angioplasty implant and graft: Secondary | ICD-10-CM

## 2013-04-16 DIAGNOSIS — Z23 Encounter for immunization: Secondary | ICD-10-CM

## 2013-04-16 DIAGNOSIS — I2129 ST elevation (STEMI) myocardial infarction involving other sites: Secondary | ICD-10-CM

## 2013-04-16 DIAGNOSIS — Z803 Family history of malignant neoplasm of breast: Secondary | ICD-10-CM

## 2013-04-16 DIAGNOSIS — I739 Peripheral vascular disease, unspecified: Secondary | ICD-10-CM | POA: Diagnosis present

## 2013-04-16 DIAGNOSIS — Z598 Other problems related to housing and economic circumstances: Secondary | ICD-10-CM

## 2013-04-16 DIAGNOSIS — Z885 Allergy status to narcotic agent status: Secondary | ICD-10-CM

## 2013-04-16 DIAGNOSIS — K759 Inflammatory liver disease, unspecified: Secondary | ICD-10-CM | POA: Diagnosis present

## 2013-04-16 DIAGNOSIS — I679 Cerebrovascular disease, unspecified: Secondary | ICD-10-CM | POA: Diagnosis present

## 2013-04-16 DIAGNOSIS — Z5987 Material hardship due to limited financial resources, not elsewhere classified: Secondary | ICD-10-CM

## 2013-04-16 DIAGNOSIS — Z91199 Patient's noncompliance with other medical treatment and regimen due to unspecified reason: Secondary | ICD-10-CM

## 2013-04-16 DIAGNOSIS — I10 Essential (primary) hypertension: Secondary | ICD-10-CM | POA: Diagnosis present

## 2013-04-16 DIAGNOSIS — M47812 Spondylosis without myelopathy or radiculopathy, cervical region: Secondary | ICD-10-CM | POA: Diagnosis present

## 2013-04-16 DIAGNOSIS — Z9119 Patient's noncompliance with other medical treatment and regimen: Secondary | ICD-10-CM

## 2013-04-16 DIAGNOSIS — K219 Gastro-esophageal reflux disease without esophagitis: Secondary | ICD-10-CM | POA: Diagnosis present

## 2013-04-16 DIAGNOSIS — Z8249 Family history of ischemic heart disease and other diseases of the circulatory system: Secondary | ICD-10-CM

## 2013-04-16 NOTE — ED Notes (Signed)
Phlebotomist drawing labs at this time.

## 2013-04-16 NOTE — ED Notes (Addendum)
Pain in middle chest radiating to back and both arms.  She took 4 ASA and 1 Nitro at home. EMS gave 150 mcg of Fentanyl by doctors. Pain was 8/10. Now 5/10. Pressure started 160/96 and now 118/50. Last time she ate was at 1800

## 2013-04-16 NOTE — ED Notes (Addendum)
Pads placed on pt. Dr. Norlene Campbell at bedside. Cardiologist at bedside.

## 2013-04-17 ENCOUNTER — Encounter (HOSPITAL_COMMUNITY): Payer: Self-pay | Admitting: Internal Medicine

## 2013-04-17 ENCOUNTER — Encounter (HOSPITAL_COMMUNITY)
Admission: EM | Disposition: A | Discharge status: Home / Self Care | Payer: Self-pay | Source: Home / Self Care | Attending: Cardiovascular Disease

## 2013-04-17 DIAGNOSIS — I2119 ST elevation (STEMI) myocardial infarction involving other coronary artery of inferior wall: Secondary | ICD-10-CM

## 2013-04-17 DIAGNOSIS — F17200 Nicotine dependence, unspecified, uncomplicated: Secondary | ICD-10-CM

## 2013-04-17 DIAGNOSIS — I219 Acute myocardial infarction, unspecified: Secondary | ICD-10-CM

## 2013-04-17 DIAGNOSIS — I2129 ST elevation (STEMI) myocardial infarction involving other sites: Secondary | ICD-10-CM

## 2013-04-17 DIAGNOSIS — I25810 Atherosclerosis of coronary artery bypass graft(s) without angina pectoris: Secondary | ICD-10-CM

## 2013-04-17 DIAGNOSIS — I5041 Acute combined systolic (congestive) and diastolic (congestive) heart failure: Secondary | ICD-10-CM

## 2013-04-17 DIAGNOSIS — I213 ST elevation (STEMI) myocardial infarction of unspecified site: Secondary | ICD-10-CM | POA: Diagnosis present

## 2013-04-17 HISTORY — PX: LEFT HEART CATH: SHX5478

## 2013-04-17 LAB — POCT I-STAT, CHEM 8
BUN: 17 mg/dL (ref 6–23)
CHLORIDE: 107 meq/L (ref 96–112)
CREATININE: 1.2 mg/dL — AB (ref 0.50–1.10)
Calcium, Ion: 1.11 mmol/L — ABNORMAL LOW (ref 1.13–1.30)
GLUCOSE: 117 mg/dL — AB (ref 70–99)
HEMATOCRIT: 41 % (ref 36.0–46.0)
Hemoglobin: 13.9 g/dL (ref 12.0–15.0)
POTASSIUM: 3.6 meq/L — AB (ref 3.7–5.3)
Sodium: 144 mEq/L (ref 137–147)
TCO2: 24 mmol/L (ref 0–100)

## 2013-04-17 LAB — COMPREHENSIVE METABOLIC PANEL
ALBUMIN: 3.2 g/dL — AB (ref 3.5–5.2)
ALT: 41 U/L — ABNORMAL HIGH (ref 0–35)
AST: 121 U/L — AB (ref 0–37)
Alkaline Phosphatase: 94 U/L (ref 39–117)
BUN: 15 mg/dL (ref 6–23)
CALCIUM: 8.4 mg/dL (ref 8.4–10.5)
CO2: 23 mEq/L (ref 19–32)
Chloride: 103 mEq/L (ref 96–112)
Creatinine, Ser: 1.06 mg/dL (ref 0.50–1.10)
GFR calc Af Amer: 64 mL/min — ABNORMAL LOW (ref >90)
GFR calc non Af Amer: 55 mL/min — ABNORMAL LOW (ref >90)
Glucose, Bld: 110 mg/dL — ABNORMAL HIGH (ref 70–99)
Potassium: 4.2 mEq/L (ref 3.7–5.3)
SODIUM: 140 meq/L (ref 137–147)
TOTAL PROTEIN: 6.2 g/dL (ref 6.0–8.3)
Total Bilirubin: <0.2 mg/dL — ABNORMAL LOW (ref 0.3–1.2)

## 2013-04-17 LAB — CBC
HEMATOCRIT: 40.9 % (ref 36.0–46.0)
HEMOGLOBIN: 13.9 g/dL (ref 12.0–15.0)
MCH: 30.3 pg (ref 26.0–34.0)
MCHC: 34 g/dL (ref 30.0–36.0)
MCV: 89.1 fL (ref 78.0–100.0)
Platelets: 254 10*3/uL (ref 150–400)
RBC: 4.59 MIL/uL (ref 3.87–5.11)
RDW: 14.1 % (ref 11.5–15.5)
WBC: 7 10*3/uL (ref 4.0–10.5)

## 2013-04-17 LAB — POCT I-STAT TROPONIN I: Troponin i, poc: 0.01 ng/mL (ref 0.00–0.08)

## 2013-04-17 LAB — PLATELET COUNT: Platelets: 228 10*3/uL (ref 150–400)

## 2013-04-17 LAB — CBC WITH DIFFERENTIAL/PLATELET
BASOS ABS: 0 10*3/uL (ref 0.0–0.1)
Basophils Relative: 0 % (ref 0–1)
EOS PCT: 0 % (ref 0–5)
Eosinophils Absolute: 0 10*3/uL (ref 0.0–0.7)
HCT: 39 % (ref 36.0–46.0)
Hemoglobin: 13.4 g/dL (ref 12.0–15.0)
Lymphocytes Relative: 14 % (ref 12–46)
Lymphs Abs: 1.4 10*3/uL (ref 0.7–4.0)
MCH: 30.5 pg (ref 26.0–34.0)
MCHC: 34.4 g/dL (ref 30.0–36.0)
MCV: 88.6 fL (ref 78.0–100.0)
Monocytes Absolute: 0.7 10*3/uL (ref 0.1–1.0)
Monocytes Relative: 7 % (ref 3–12)
Neutro Abs: 7.7 10*3/uL (ref 1.7–7.7)
Neutrophils Relative %: 78 % — ABNORMAL HIGH (ref 43–77)
PLATELETS: 229 10*3/uL (ref 150–400)
RBC: 4.4 MIL/uL (ref 3.87–5.11)
RDW: 14.1 % (ref 11.5–15.5)
WBC: 9.9 10*3/uL (ref 4.0–10.5)

## 2013-04-17 LAB — APTT: aPTT: 93 seconds — ABNORMAL HIGH (ref 24–37)

## 2013-04-17 LAB — LIPID PANEL
Cholesterol: 239 mg/dL — ABNORMAL HIGH (ref 0–200)
HDL: 39 mg/dL — AB (ref >39)
LDL CALC: 151 mg/dL — AB (ref 0–99)
TRIGLYCERIDES: 243 mg/dL — AB (ref <150)
Total CHOL/HDL Ratio: 6.1 RATIO
VLDL: 49 mg/dL — AB (ref 0–40)

## 2013-04-17 LAB — PROTIME-INR
INR: 0.91 (ref 0.00–1.49)
INR: 1.5 — ABNORMAL HIGH (ref 0.00–1.49)
PROTHROMBIN TIME: 17.7 s — AB (ref 11.6–15.2)
Prothrombin Time: 12.1 seconds (ref 11.6–15.2)

## 2013-04-17 LAB — MAGNESIUM: Magnesium: 1.7 mg/dL (ref 1.5–2.5)

## 2013-04-17 LAB — MRSA PCR SCREENING: MRSA BY PCR: NEGATIVE

## 2013-04-17 LAB — TROPONIN I: Troponin I: >20 ng/mL (ref <0.30)

## 2013-04-17 SURGERY — LEFT HEART CATH
Anesthesia: LOCAL | Laterality: Bilateral

## 2013-04-17 MED ORDER — INFLUENZA VAC SPLIT QUAD 0.5 ML IM SUSP
0.5000 mL | INTRAMUSCULAR | Status: DC
  Filled 2013-04-17: qty 0.5

## 2013-04-17 MED ORDER — SODIUM CHLORIDE 0.9 % IV SOLN: 0.2500 mg/kg/h | INTRAVENOUS | Status: DC

## 2013-04-17 MED ORDER — METOPROLOL TARTRATE 12.5 MG HALF TABLET
12.5000 mg | ORAL_TABLET | Freq: Two times a day (BID) | ORAL | Status: DC
  Administered 2013-04-17 – 2013-04-18 (×4): 12.5 mg via ORAL
  Filled 2013-04-17 (×5): qty 1

## 2013-04-17 MED ORDER — BIVALIRUDIN 250 MG IV SOLR
INTRAVENOUS | Status: AC
  Filled 2013-04-17: qty 250

## 2013-04-17 MED ORDER — AMIODARONE HCL IN DEXTROSE 360-4.14 MG/200ML-% IV SOLN
30.0000 mg/h | INTRAVENOUS | Status: AC
  Administered 2013-04-17: 30 mg/h via INTRAVENOUS
  Filled 2013-04-17: qty 200

## 2013-04-17 MED ORDER — ALPRAZOLAM 0.5 MG PO TABS
0.5000 mg | ORAL_TABLET | Freq: Once | ORAL | Status: AC
  Administered 2013-04-17: 0.5 mg via ORAL
  Filled 2013-04-17: qty 1

## 2013-04-17 MED ORDER — NITROGLYCERIN IN D5W 200-5 MCG/ML-% IV SOLN
2.0000 ug/min | INTRAVENOUS | Status: DC
Start: 2013-04-17 — End: 2013-04-20
  Administered 2013-04-17: 10 ug/min via INTRAVENOUS

## 2013-04-17 MED ORDER — SODIUM CHLORIDE 0.9 % IV SOLN
1000.0000 mL | Freq: Once | INTRAVENOUS | Status: AC
  Administered 2013-04-17: 1000 mL via INTRAVENOUS

## 2013-04-17 MED ORDER — NITROGLYCERIN 0.4 MG SL SUBL: 0.4000 mg | SUBLINGUAL_TABLET | SUBLINGUAL | Status: DC | PRN

## 2013-04-17 MED ORDER — ONDANSETRON HCL 4 MG/2ML IJ SOLN: 4.0000 mg | Freq: Four times a day (QID) | INTRAMUSCULAR | Status: DC | PRN

## 2013-04-17 MED ORDER — AMIODARONE HCL IN DEXTROSE 360-4.14 MG/200ML-% IV SOLN
60.0000 mg/h | INTRAVENOUS | Status: AC
  Filled 2013-04-17 (×2): qty 200

## 2013-04-17 MED ORDER — BIVALIRUDIN 250 MG IV SOLR
0.2500 mg/kg/h | INTRAVENOUS | Status: AC
  Administered 2013-04-17: 0.25 mg/kg/h via INTRAVENOUS
  Filled 2013-04-17: qty 250

## 2013-04-17 MED ORDER — NITROGLYCERIN IN D5W 200-5 MCG/ML-% IV SOLN
INTRAVENOUS | Status: AC
  Filled 2013-04-17: qty 250

## 2013-04-17 MED ORDER — LIDOCAINE HCL (PF) 1 % IJ SOLN
INTRAMUSCULAR | Status: AC
  Filled 2013-04-17: qty 30

## 2013-04-17 MED ORDER — TIROFIBAN HCL IV 5 MG/100ML
INTRAVENOUS | Status: AC
  Filled 2013-04-17: qty 100

## 2013-04-17 MED ORDER — NITROGLYCERIN 0.2 MG/ML ON CALL CATH LAB
INTRAVENOUS | Status: AC
  Filled 2013-04-17: qty 1

## 2013-04-17 MED ORDER — CLOPIDOGREL BISULFATE 300 MG PO TABS
ORAL_TABLET | ORAL | Status: AC
  Filled 2013-04-17: qty 2

## 2013-04-17 MED ORDER — ONDANSETRON HCL 4 MG/2ML IJ SOLN
INTRAMUSCULAR | Status: AC
  Filled 2013-04-17: qty 2

## 2013-04-17 MED ORDER — SODIUM CHLORIDE 0.9 % IV SOLN
INTRAVENOUS | Status: AC
  Administered 2013-04-17: 03:00:00 via INTRAVENOUS

## 2013-04-17 MED ORDER — CLOPIDOGREL BISULFATE 75 MG PO TABS
75.0000 mg | ORAL_TABLET | Freq: Every day | ORAL | Status: DC
  Administered 2013-04-17 – 2013-04-20 (×4): 75 mg via ORAL
  Filled 2013-04-17 (×6): qty 1

## 2013-04-17 MED ORDER — ATORVASTATIN CALCIUM 80 MG PO TABS
80.0000 mg | ORAL_TABLET | Freq: Every day | ORAL | Status: DC
  Administered 2013-04-17 – 2013-04-19 (×3): 80 mg via ORAL
  Filled 2013-04-17 (×4): qty 1

## 2013-04-17 MED ORDER — ATROPINE SULFATE 0.1 MG/ML IJ SOLN
INTRAMUSCULAR | Status: AC
  Filled 2013-04-17: qty 10

## 2013-04-17 MED ORDER — HEPARIN SODIUM (PORCINE) 5000 UNIT/ML IJ SOLN
4000.0000 [IU] | Freq: Once | INTRAMUSCULAR | Status: AC
  Administered 2013-04-17: 4000 [IU] via INTRAVENOUS

## 2013-04-17 MED ORDER — PNEUMOCOCCAL VAC POLYVALENT 25 MCG/0.5ML IJ INJ
0.5000 mL | INJECTION | INTRAMUSCULAR | Status: DC
  Filled 2013-04-17: qty 0.5

## 2013-04-17 MED ORDER — PANTOPRAZOLE SODIUM 40 MG PO TBEC
40.0000 mg | DELAYED_RELEASE_TABLET | Freq: Every day | ORAL | Status: DC
  Administered 2013-04-17 – 2013-04-20 (×4): 40 mg via ORAL
  Filled 2013-04-17 (×4): qty 1

## 2013-04-17 MED ORDER — CLOPIDOGREL BISULFATE 75 MG PO TABS
75.0000 mg | ORAL_TABLET | Freq: Every day | ORAL | Status: DC
  Filled 2013-04-17: qty 1

## 2013-04-17 MED ORDER — AMIODARONE HCL IN DEXTROSE 360-4.14 MG/200ML-% IV SOLN
60.0000 mg/h | INTRAVENOUS | Status: AC
  Administered 2013-04-17 (×2): 60 mg/h via INTRAVENOUS

## 2013-04-17 MED ORDER — ASPIRIN 81 MG PO CHEW
81.0000 mg | CHEWABLE_TABLET | Freq: Every day | ORAL | Status: DC
  Administered 2013-04-17 – 2013-04-20 (×4): 81 mg via ORAL
  Filled 2013-04-17 (×4): qty 1

## 2013-04-17 MED ORDER — ACTIVE PARTNERSHIP FOR HEALTH OF YOUR HEART BOOK
Freq: Once | Status: AC
  Administered 2013-04-17: 08:00:00
  Filled 2013-04-17: qty 1

## 2013-04-17 MED ORDER — AMIODARONE HCL 150 MG/3ML IV SOLN
INTRAVENOUS | Status: AC
  Filled 2013-04-17: qty 3

## 2013-04-17 MED ORDER — ENOXAPARIN SODIUM 40 MG/0.4ML ~~LOC~~ SOLN
40.0000 mg | SUBCUTANEOUS | Status: DC
  Administered 2013-04-17 – 2013-04-19 (×3): 40 mg via SUBCUTANEOUS
  Filled 2013-04-17 (×4): qty 0.4

## 2013-04-17 MED ORDER — HEART ATTACK BOUNCING BOOK
Freq: Once | Status: AC
  Administered 2013-04-17: 08:00:00
  Filled 2013-04-17: qty 1

## 2013-04-17 MED ORDER — ONDANSETRON HCL 4 MG/2ML IJ SOLN
INTRAMUSCULAR | Status: AC
  Administered 2013-04-17: 4 mg
  Filled 2013-04-17: qty 2

## 2013-04-17 MED ORDER — FUROSEMIDE 10 MG/ML IJ SOLN
INTRAMUSCULAR | Status: AC
  Filled 2013-04-17: qty 4

## 2013-04-17 MED ORDER — ADENOSINE 6 MG/2ML IV SOLN
INTRAVENOUS | Status: AC
  Filled 2013-04-17: qty 2

## 2013-04-17 MED ORDER — HEPARIN (PORCINE) IN NACL 2-0.9 UNIT/ML-% IJ SOLN
INTRAMUSCULAR | Status: AC
Start: 2013-04-17 — End: 2013-04-17
  Filled 2013-04-17: qty 1000

## 2013-04-17 MED ORDER — ATORVASTATIN CALCIUM 80 MG PO TABS
80.0000 mg | ORAL_TABLET | Freq: Every day | ORAL | Status: DC
  Administered 2013-04-17: 80 mg via ORAL
  Filled 2013-04-17 (×2): qty 1

## 2013-04-17 MED ORDER — ASPIRIN 81 MG PO CHEW: 81.0000 mg | CHEWABLE_TABLET | Freq: Every day | ORAL | Status: DC

## 2013-04-17 MED ORDER — FENTANYL CITRATE 0.05 MG/ML IJ SOLN
INTRAMUSCULAR | Status: AC
  Filled 2013-04-17: qty 2

## 2013-04-17 MED ORDER — MIDAZOLAM HCL 2 MG/2ML IJ SOLN
INTRAMUSCULAR | Status: AC
  Filled 2013-04-17: qty 2

## 2013-04-17 MED ORDER — ANGIOPLASTY BOOK
Freq: Once | Status: AC
  Administered 2013-04-17: 08:00:00
  Filled 2013-04-17: qty 1

## 2013-04-17 MED ORDER — TIROFIBAN HCL IV 5 MG/100ML
0.1500 ug/kg/min | INTRAVENOUS | Status: AC
  Administered 2013-04-17 (×3): 0.15 ug/kg/min via INTRAVENOUS
  Filled 2013-04-17 (×3): qty 100

## 2013-04-17 NOTE — ED Provider Notes (Signed)
CSN: [id number]     Arrival date & time 04/16/13  2354 History   First MD Initiated Contact with Patient 04/17/13 0000     Chief Complaint  Patient presents with  . Code STEMI     (Consider location/radiation/quality/duration/timing/severity/associated sxs/prior Treatment) HPI 63 yo female presents to the ER from home via EMS with complaint of chest pain.  Pt s/p CABG, stents through grafts.  Pain started about an hour prior to arrival.  She has taken full strength asa, 1 ntg.  Pain is similar to prior MI.  She reports nausea, diaphoresis, sob.  Pt received fentanyl 150 mcg from EMS.  Pain has gone from 8 to 5.  EKG in route with ST elevations inferiorly with reciprocal changes.  Cardiologist is Dr Katrinka Blazing. Past Medical History  Diagnosis Date  . ASCVD (arteriosclerotic cardiovascular disease)     CABG in 2006   . PVD (peripheral vascular disease)   . Cerebrovascular disease   . Hypertension   . Tobacco abuse   . GERD (gastroesophageal reflux disease)   . H. pylori infection   . Hepatitis   . Diverticulitis   . DJD (degenerative joint disease) of cervical spine   . Migraine headache   . Coronary artery disease    Past Surgical History  Procedure Laterality Date  . Coronary artery bypass graft  2006    RIMA to the RCA, LIMA to the LAD, SVG to the circumflex  . Carotid endarterectomy      Right  . Cholecystectomy    . Abdominal hysterectomy    . Hemorrhoid surgery    . Colonoscopy     Family History  Problem Relation Age of Onset  . Pneumonia Mother   . Coronary artery disease Father   . Heart disease Sister   . Colon cancer Maternal Aunt     Great aunt, rectal  . Diabetes Maternal Uncle   . Heart disease Maternal Uncle   . Breast cancer Maternal Grandmother   . Heart disease Other     Father's side of family   History  Substance Use Topics  . Smoking status: Current Every Day Smoker -- 0.50 packs/day  . Smokeless tobacco: Former Neurosurgeon    Quit date: 04/17/2013      Comment: 1/2 pack per day since 2005, 40 pack years  . Alcohol Use: No   OB History   Grav Para Term Preterm Abortions TAB SAB Ect Mult Living                 Review of Systems  Unable to perform ROS: Acuity of condition      Allergies  Codeine and Morphine  Home Medications  No current outpatient prescriptions on file. BP 128/65  Pulse 57  Temp(Src) 98.2 F (36.8 C) (Oral)  Resp 21  Ht 5\' 8"  (1.727 m)  Wt 220 lb 0.3 oz (99.8 kg)  BMI 33.46 kg/m2  SpO2 96% Physical Exam  Nursing note and vitals reviewed. Constitutional: She is oriented to person, place, and time. She appears well-developed and well-nourished. She appears distressed.  HENT:  Head: Normocephalic and atraumatic.  Nose: Nose normal.  Mouth/Throat: Oropharynx is clear and moist.  Eyes: Conjunctivae and EOM are normal. Pupils are equal, round, and reactive to light.  Neck: Normal range of motion. Neck supple. No JVD present. No tracheal deviation present. No thyromegaly present.  Cardiovascular: Normal rate, regular rhythm, normal heart sounds and intact distal pulses.  Exam reveals no gallop and no friction  rub.   No murmur heard. Pulmonary/Chest: Effort normal and breath sounds normal. No stridor. No respiratory distress. She has no wheezes. She has no rales. She exhibits no tenderness.  Abdominal: Soft. Bowel sounds are normal. She exhibits no distension and no mass. There is no tenderness. There is no rebound and no guarding.  Musculoskeletal: Normal range of motion. She exhibits no edema and no tenderness.  Lymphadenopathy:    She has no cervical adenopathy.  Neurological: She is alert and oriented to person, place, and time. She exhibits normal muscle tone. Coordination normal.  Skin: Skin is warm. No rash noted. She is diaphoretic. No erythema. No pallor.  Psychiatric: She has a normal mood and affect. Her behavior is normal. Judgment and thought content normal.    ED Course  Procedures (including  critical care time)  CRITICAL CARE Performed by: [name] MackieTTER,Georga Stys M Total critical care time: 30 min Critical care time was exclusive of separately billable procedures and treating other patients. Critical care was necessary to treat or prevent imminent or life-threatening deterioration. Critical care was time spent personally by me on the following activities: development of treatment plan with patient and/or surrogate as well as nursing, discussions with consultants, evaluation of patient's response to treatment, examination of patient, obtaining history from patient or surrogate, ordering and performing treatments and interventions, ordering and review of laboratory studies, ordering and review of radiographic studies, pulse oximetry and re-evaluation of patient's condition.  Labs Review Labs Reviewed  TROPONIN I - Abnormal; Notable for the following:    Troponin I >20.00 (*)    All other components within normal limits  TROPONIN I - Abnormal; Notable for the following:    Troponin I >20.00 (*)    All other components within normal limits  TROPONIN I - Abnormal; Notable for the following:    Troponin I >20.00 (*)    All other components within normal limits  PROTIME-INR - Abnormal; Notable for the following:    Prothrombin Time 17.7 (*)    INR 1.50 (*)    All other components within normal limits  APTT - Abnormal; Notable for the following:    aPTT 93 (*)    All other components within normal limits  CBC WITH DIFFERENTIAL - Abnormal; Notable for the following:    Neutrophils Relative % 78 (*)    All other components within normal limits  COMPREHENSIVE METABOLIC PANEL - Abnormal; Notable for the following:    Glucose, Bld 110 (*)    Albumin 3.2 (*)    AST 121 (*)    ALT 41 (*)    Total Bilirubin <0.2 (*)    GFR calc non Af Amer 55 (*)    GFR calc Af Amer 64 (*)    All other components within normal limits  LIPID PANEL - Abnormal; Notable for the following:    Cholesterol 239 (*)     Triglycerides 243 (*)    HDL 39 (*)    VLDL 49 (*)    LDL Cholesterol 151 (*)    All other components within normal limits  POCT I-STAT, CHEM 8 - Abnormal; Notable for the following:    Potassium 3.6 (*)    Creatinine, Ser 1.20 (*)    Glucose, Bld 117 (*)    Calcium, Ion 1.11 (*)    All other components within normal limits  MRSA PCR SCREENING  PROTIME-INR  CBC  MAGNESIUM  PLATELET COUNT  CBC  CBC  I-STAT TROPOININ, ED  I-STAT CHEM 8, ED  POCT I-STAT TROPONIN I   Imaging Review No results found.   Date: 04/17/2013  Rate:82  Rhythm: normal sinus rhythm  QRS Axis: normal  Intervals: normal  ST/T Wave abnormalities: ST elevations inferiorly and ST depressions laterally  Conduction Disutrbances:none  Narrative Interpretation:   Old EKG Reviewed: changes noted    MDM   Final diagnoses:  STEMI (ST elevation myocardial infarction)    63 yo female with STEMI.  Code STEMI called in route.  Pt has received heparin, plan to emergently go for cath.    [name] Mackie, MD 04/17/13 2036

## 2013-04-17 NOTE — Progress Notes (Signed)
Pt post cath, receiving lasix for diuresis, extended BR with left femoral sheath in place. Pt on continuous angiomax and Aggrastat.  Order for Foley cath noted. Foley huddle performed. 14 Fr Foley cath placed under sterile technique. Peri care provided prior to insertion. Clear yellow urine noted on insertion. Foley strap used to secure. Pt educated and voiced understanding. Foley to be removed when BR complete.

## 2013-04-17 NOTE — Progress Notes (Signed)
Left femoral arterial sheath removed per protocol.  Direct manual pressure was held x 30 minutes. Site is free of bleeding or hematoma. Patient was educated on bleeding precautions and signs and symptoms of bleeding. Dressing was applied to site and bedrest was initiated post removal.

## 2013-04-17 NOTE — H&P (Signed)
History and Physical  Patient ID: [name] MRN: [id number], DOB: 05-29-1950 Date of Encounter: 04/17/2013, 12:28 AM Primary Physician: Cassell Smiles., MD Primary Cardiologist: Verdis Prime, MD  Chief Complaint: Chest pain Reason for Admission: STEMI  HPI: 63 y/o F with h/o CAD s/p CABG in 2006 (LIMA -> LAD, RIMA -> RCA, and SVG -> OM), STEMI in 09/2012 with emergent PCI (BMS to SVG -> OM), PVD s/p left subclavian artery stent, bilateral iliac stents, and right fem-pop bypass, HTN, and GERD, who presented to ED via EMS with severe chest pain that began at ~3 PM.  The pain began suddenly with associated nausea.  She tried to eat dinner but could not due to the pain.  She took a SL NTG and 4 ASA 81 mg without improvement in her pain.  She subsequently called 911.  Upon arrival, EMS found the patient to have 1-2 mm inferior ST elevations with anterior ST depressions, prompting STEMI alert to be sent out.  Patient received an additional SL NTG as well as fentanyl 150 mcg by EMS with improvement in her pain from 10/10 to 5/10.  In ED, pt received heparin 4,000 units IV x 1.  Of note, the patient does not have insurance and has difficultly affording her medications.  She has only been taking ASA and omeprazole for the last several months.  Past Medical History  Diagnosis Date  . ASCVD (arteriosclerotic cardiovascular disease)     CABG in 2006   . PVD (peripheral vascular disease)   . Cerebrovascular disease   . Hypertension   . Tobacco abuse   . GERD (gastroesophageal reflux disease)   . H. pylori infection   . Hepatitis   . Diverticulitis   . DJD (degenerative joint disease) of cervical spine   . Migraine headache   . Coronary artery disease      Most Recent Cardiac Studies: LHC/PCI (09/27/12): Subtotally occluded LCx with 99% stenosis in SVG -> OM.  This was treated with 4.0 x 16 mm BMS.  Other anatomy:  1. Left main; 60% diffuse  2. LAD; 50% proximal and mid with competitive flow  from the IMA  3. Left circumflex; percent proximal AV groove, occluded second obtuse marginal branch. There was a moderate sized ramus branch as well.  4. Right coronary artery; dominant with 90% stenosis in the first bend and percent stenosis the stenosis after the acute marginal branch  5.LIMA TO LAD; widely patent as was the left subclavian artery stent  6. RIMA TO RCA was subselectively visualized and widely patent  SVG TO the circumflex obtuse marginal branch had a 99% stenosis in the distal third with TIMI 2 flow  7. Left ventriculography; RAO left ventriculogram was performed using  25 mL of Visipaque dye at 12 mL/second. The overall LVEF estimated  45-50 percent with severe inferoapical hypokinesia.   Surgical History:  Past Surgical History  Procedure Laterality Date  . Coronary artery bypass graft  2006    RIMA to the RCA, LIMA to the LAD, SVG to the circumflex  . Carotid endarterectomy      Right  . Cholecystectomy    . Abdominal hysterectomy    . Hemorrhoid surgery    . Colonoscopy       Home Meds: Prior to Admission medications   Medication Sig Start Date Toba Claudio Date Taking? Authorizing Provider  aspirin 81 MG tablet Take 81 mg by mouth daily.     Yes Historical Provider, MD  nitroGLYCERIN (NITROSTAT)  0.4 MG SL tablet Place 1 tablet (0.4 mg total) under the tongue every 5 (five) minutes x 3 doses as needed for chest pain. 09/30/12  Yes Lyn RecordsHenry W Smith III, MD  omeprazole (PRILOSEC) 20 MG capsule Take 20 mg by mouth daily.     Yes Historical Provider, MD  atorvastatin (LIPITOR) 40 MG tablet Take 1 tablet (40 mg total) by mouth daily at 6 PM. 09/30/12   Lyn RecordsHenry W Smith III, MD  lisinopril-hydrochlorothiazide (PRINZIDE) 10-12.5 MG per tablet Take 1 tablet by mouth daily. 09/30/12   Lyn RecordsHenry W Smith III, MD  metoprolol tartrate (LOPRESSOR) 25 MG tablet Take 1 tablet (25 mg total) by mouth 2 (two) times daily. 09/30/12   Lesleigh NoeHenry W Smith III, MD    Allergies:  Allergies  Allergen Reactions   . Codeine   . Morphine     History   Social History  . Marital Status: Single    Spouse Name: N/A    Number of Children: N/A  . 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 Smoking status: Current Every Day Smoker -- 0.50 packs/day  . Smokeless tobacco: Not on file     Comment: 1/2 pack per day since 2005, 40 pack years  . Alcohol Use: No  . Drug Use: No  . Sexual Activity: Not on file   Other Topics Concern  . Not on file   Social History Narrative   Divorced, no children     Family History  Problem Relation Age of Onset  . Pneumonia Mother   . Coronary artery disease Father   . Heart disease Sister   . Colon cancer Maternal Aunt     Great aunt, rectal  . Diabetes Maternal Uncle   . Heart disease Maternal Uncle   . Breast cancer Maternal Grandmother   . Heart disease Other     Father's side of family    Review of Systems: Chronic heartburn.  Otherwise 10-system review of systems was negative.  Labs:   Lab Results  Component Value Date   WBC 7.1 09/28/2012   HGB 13.9 04/17/2013   HCT 41.0 04/17/2013   MCV 88.2 09/28/2012   PLT 194 09/28/2012    Recent Labs Lab 04/17/13 0020  NA 144  K 3.6*  CL 107  BUN 17  CREATININE 1.20*  GLUCOSE 117*   Cardiac biomarkers pending.  Radiology/Studies:  No results found.   EKG: NSR with 1 mm ST elevations in II, III, aVF, and V6.  1-2 mm ST depressions in V1-V2.  Physical Exam: Blood pressure 142/72, pulse 75, temperature 97.6 F (36.4 C), temperature source Oral, resp. rate 14, height 5' 8.5" (1.74 m), weight 102.059 kg (225 lb), SpO2 99.00%. General: Obese, uncomfortable appearing woman lying on stretcher. Head: Normocephalic, atraumatic, sclera non-icteric, no xanthomas, nares are without discharge.  Neck: Negative for carotid bruits. JVD not elevated. Lungs: Clear bilaterally to auscultation without wheezes, rales, or rhonchi.  Breathing is unlabored. Heart: RRR with S1 S2. No murmurs, rubs, or gallops appreciated. Abdomen: Soft, non-tender, non-distended with normoactive bowel sounds. No hepatomegaly. No rebound/guarding. No obvious abdominal masses. Msk:  Strength and tone appear normal for age. Extremities: No clubbing or cyanosis. No edema.  Distal pedal pulses are 2+ and equal bilaterally. Neuro: Alert and oriented X 3. No focal deficit. No facial asymmetry. Moves all extremities spontaneously. Psych:  Responds to questions appropriately with a normal  affect.    ASSESSMENT AND PLAN:  63 y/o F with h/o CAD, PVD, HTN, HLD, and GERD, who presents with chest pain and EKG changes concerning for STEMI.  STEMI: - Pt to cath lab for emergent LHC; plan for cardiac ICU admission following procedure. - Restart atorvastatin 80 mg daily - Continue ASA 81 mg daily - Start P2Y12 inhibitor following LHC, per Dr. Hazle Coca preference - Start metoprolol tartrate 12.5 mg BID following procedure as BP/HR allow - Plan for TTE to assess LV function  HTN: Reassess following procedure.  Given h/o mild-moderately reduced LV function, would like to restart ACEI before discharge.  GERD: - Continue PPI  Code status: - Full code.  Signed, Mirl Hillery A. MD 04/17/2013, 12:28 AM

## 2013-04-17 NOTE — ED Notes (Signed)
Upstairs ready for pt. Pt transported  On Zoll and monitor with Leavy Cella RN, Onalee Hua EMT, and Cardiologist. No issues with transportation. Report given to Cath lab RN. No questions or concerns.

## 2013-04-17 NOTE — Progress Notes (Addendum)
SUBJECTIVE:   mild intermittent CP overnight that has resolved  OBJECTIVE:   Vitals:   Filed Vitals:   04/17/13 0430 04/17/13 0500 04/17/13 0600 04/17/13 0700  BP:  94/59 113/70 104/64  Pulse:      Temp:      TempSrc:      Resp: 20 20 21 19   Height:      Weight:      SpO2: 98% 98% 100% 98%   I&O's:   Intake/Output Summary (Last 24 hours) at 04/17/13 16100822 Last data filed at 04/17/13 [id number]  Gross per 24 hour  Intake 454.68 ml  Output   2100 ml  Net -1645.32 ml   TELEMETRY: Reviewed telemetry pt in NSR:     PHYSICAL EXAM General: Well developed, well nourished, in no acute distress Head: Eyes PERRLA, No xanthomas.   Normal cephalic and atramatic  Lungs:   Clear bilaterally to auscultation and percussion. Heart:   HRRR S1 S2 Pulses are 2+ & equal. Abdomen: Bowel sounds are positive, abdomen soft and non-tender without masses  Extremities:   No clubbing, cyanosis or edema.  DP +1.  Left femoral sheath in place Neuro: Alert and oriented X 3. Psych:  Good affect, responds appropriately   LABS: Basic Metabolic Panel:  Recent Labs  54/10/8101/15/15 0020 04/17/13 0300  NA 144 140  K 3.6* 4.2  CL 107 103  CO2  --  23  GLUCOSE 117* 110*  BUN 17 15  CREATININE 1.20* 1.06  CALCIUM  --  8.4  MG  --  1.7   Liver Function Tests:  Recent Labs  04/17/13 0300  AST 121*  ALT 41*  ALKPHOS 94  BILITOT <0.2*  PROT 6.2  ALBUMIN 3.2*   No results found for this basename: LIPASE, AMYLASE,  in the last 72 hours CBC:  Recent Labs  04/17/13 0009 04/17/13 0020 04/17/13 0300  WBC 7.0  --  9.9  NEUTROABS  --   --  7.7  HGB 13.9 13.9 13.4  HCT 40.9 41.0 39.0  MCV 89.1  --  88.6  PLT 254  --  229   Cardiac Enzymes:  Recent Labs  04/17/13 0218  TROPONINI >20.00*   BNP: No components found with this basename: POCBNP,  D-Dimer: No results found for this basename: DDIMER,  in the last 72 hours Hemoglobin A1C: No results found for this basename: HGBA1C,  in the last 72  hours Fasting Lipid Panel:  Recent Labs  04/17/13 0500  CHOL 239*  HDL 39*  LDLCALC 151*  TRIG 243*  CHOLHDL 6.1   Thyroid Function Tests: No results found for this basename: TSH, T4TOTAL, FREET3, T3FREE, THYROIDAB,  in the last 72 hours Anemia Panel: No results found for this basename: VITAMINB12, FOLATE, FERRITIN, TIBC, IRON, RETICCTPCT,  in the last 72 hours Coag Panel:   Lab Results  Component Value Date   INR 1.50* 04/17/2013   INR 0.91 04/17/2013   INR 1.07 09/28/2012    RADIOLOGY: No results found.  ASSESSMENT AND PLAN:  63 y/o F with h/o CAD, PVD, HTN, HLD, and GERD, who presents with chest pain and EKG changes concerning for STEMI.  1.  STEMI:  Cath showed Left main: 60% distal, LAD: diffusely diseased with segmental 90% mid just prior to the LIMA insertion. There was retrograde filling of the LIMA. Left circumflex: 50-70% segmental proximal. The first obtuse marginal branch was occluded. Right coronary artery: was not visualized but known to be totally occluded. LIMA TO LAD:  widely patent. RIMA to the RCA: Widely patent, SVG to obtuse marginal branch was totally occluded and was the infarct related artery.The overall LVEF estimated 40 % with severe inferoapical and posterolateral hypokinesia. She underwent PCI with overlapping BMS. - Continue ASA /statin/beta blocker/Plavix - Plan for TTE to assess LV function - wean IV NTG gtt off  2.  Mild to moderate LV dysfunction EF 40% with severe inferoapical and poterolateral HK - add ACE I once BP more stable 3.  HTN: BP currently in low 100 systolic 4.  Arrhythmias during cath (not stated in note what they were but placed on Amio) - continue Amio for 24 hours and d/c if no further arrhythmias. 5.  Tobacco abuse ongoing - smoking cessation counseling ordered 6.  Medical noncompliance due to financial issues.   Quintella Reichert, MD  04/17/2013  8:22 AM

## 2013-04-17 NOTE — Plan of Care (Signed)
Problem: Phase I Progression Outcomes Goal: Aspirin unless contraindicated Outcome: Completed/Met Date Met:  04/17/13 Given by EMS in route

## 2013-04-17 NOTE — ED Notes (Signed)
Normal Saline Started by ED

## 2013-04-17 NOTE — ED Notes (Signed)
EKG being completed

## 2013-04-17 NOTE — CV Procedure (Addendum)
[name] is a 63 y.o. female    [id number] LOCATION:  FACILITY: [facility]  PHYSICIAN: Nanetta BattyJonathan Berry, M.D. 1950/04/05   DATE OF PROCEDURE:  04/17/2013  DATE OF DISCHARGE:     CARDIAC CATHETERIZATION     History obtained from chart review..[name] is a 63 year old Caucasian female patient of Dr. Erskine EmeryHank Smith's history of CAD status post coronary artery bypass grafting in 2006 with LIMA to her LAD, RIMA to RCA and a vein graft to her obtuse marginal branch. Her cardiac catheterization showed left main/three-vessel disease. She has a history of peripheral vascular disease status post endarterectomy, right femoropopliteal bypass grafting and bilateral iliac stenting. She has a history of hypertension and continued tobacco abuse but she has not taken her medications for quite some time or seen a cardiologist because of financial constraints. She is complaining of chest pain off and on for last 2 weeks worse today. [facility] room where she had inferior ST segment elevation and reciprocal anterior depression. She was seen by the ER physician and Dr. Everette RankJay Varanasi who both agree this was a STEMI. The patient was brought to the Cath Lab for emergency catheterization and intervention.    PROCEDURE DESCRIPTION:   The patient was brought to the second floor [place] [facility] cath lab in the postabsorptive state. She was premedicated with IV fentanyl and Versed. Her left groinwas prepped and shaved in usual sterile fashion. Xylocaine 1% was used for local anesthesia. A 6 French sheath was inserted into the left common femoral artery using standard Seldinger technique.6 French right and left Judkins diagnostic catheters Lopid 600 pigtail catheter was used for selective coronary angiography, left ventriculography, selective vein graft angiography, selective internal mammary artery angiography. Visipaque dye was used for the entirety of the case. Retrograde aortic and left  ventricular end pullback pressures were recorded.  HEMODYNAMICS:    AO SYSTOLIC/AO DIASTOLIC: 168/89   LV SYSTOLIC/LV DIASTOLIC: 178/38  ANGIOGRAPHIC RESULTS:   1. Left main; 60% distal  2. LAD; diffusely diseased with segmental 90% mid just prior to the LIMA insertion. There was retrograde filling of the LIMA. 3. Left circumflex; 50-70% segmental proximal. The first obtuse marginal branch was occluded.  4. Right coronary artery; was not visualized but known to be totally occluded 5.LIMA TO LAD; widely patent    RIMA to the RCA: Widely patent 6. SVG TO obtuse marginal branch was totally occluded and was the infarct related artery.    7. Left ventriculography; RAO left ventriculogram was performed using  25 mL of Visipaque dye at 12 mL/second. The overall LVEF estimated  40 %  With wall motion abnormalities notable for severe inferoapical and posterolateral hypokinesia.  IMPRESSION:[name] has occluded her circumflex SVG. Will proceed with attempt at PCI and stenting using bare-metal stent, Angiomax and Plavix..  Procedure description:the patient received Angiomax bolus with an ACT of 459. A total of 150 cc of contrast was administered during the case. Using a 6 [place]French JR 4 guide catheter along with an 014/190 cm long pro water guidewire a 2.0 x 12 mm long balloon PTCA was performed of the occluded SVG. Following this a 4 mm spider distal protection device was then placed in the distal SVG up of the distal anastomosis. Overlapping rebel bare-metal stents were then deployed across the proximal diseased segment (3.5 x 32,3.5 X 18)   Nitroglycerin was also started because of the elevated left ventricular end-diastolic pressure and systolic blood pressure. IV Lasix was  given as well.   Final impression: Inferolateral STEMI secondary to occluded SVG to percutaneous revascularization with overlapping bare metal stents and a door to balloon time of 28 minutes. Because of what appeared to be  intraluminal filling defect suggestive of thrombus the patient was started on Aggrastat. She does continue to smoke which probably contributes to her disease and she stopped her medicine because of financial constraints. I will continue the full dosage of Angiomax for 4 hours, Aggrastat for 18 hours of amiodarone until tomorrow which can be discontinued if she has no further arrhythmias. She left the Cath Lab in stable condition.  [name] MD, [facility] 04/17/2013 1:46 AM

## 2013-04-17 NOTE — ED Notes (Signed)
4000 Heparin given 

## 2013-04-18 LAB — POCT ACTIVATED CLOTTING TIME: ACTIVATED CLOTTING TIME: 459 s

## 2013-04-18 MED ORDER — CARVEDILOL 3.125 MG PO TABS
3.1250 mg | ORAL_TABLET | Freq: Two times a day (BID) | ORAL | Status: DC
  Administered 2013-04-19 – 2013-04-20 (×3): 3.125 mg via ORAL
  Filled 2013-04-18 (×7): qty 1

## 2013-04-18 MED ORDER — LISINOPRIL 10 MG PO TABS
10.0000 mg | ORAL_TABLET | Freq: Every day | ORAL | Status: DC
  Administered 2013-04-18 – 2013-04-19 (×2): 10 mg via ORAL
  Filled 2013-04-18 (×4): qty 1

## 2013-04-18 MED FILL — Sodium Chloride IV Soln 0.9%: INTRAVENOUS | Qty: 50 | Status: AC

## 2013-04-18 NOTE — Progress Notes (Signed)
CARDIAC REHAB PHASE I   PRE:  Rate/Rhythm: 67 SR  BP:  Supine:   Sitting: 140/80  Standing:    SaO2: 98%RA  MODE:  Ambulation: 550 ft   POST:  Rate/Rhythm: 87  BP:  Supine:   Sitting: 180/98, 150/80 with rest  Standing:    SaO2: 96%RA 1340-1410 Pt walked 550 ft on RA with steady gait. No CP. Tolerated well. Gave pt copy of stent information since no stent card in chart. Pt has MI booklet. Discussed importance of taking plavix and aspirin. Pt needs to see case manager. Has not been able to afford meds per pt. We will follow up tomorrow to review ed. Saw pt in August.   Luetta Nutting, RN BSN  04/18/2013 2:07 PM

## 2013-04-18 NOTE — Care Management Note (Signed)
  Page 1 of 1   04/18/2013     1:56:00 PM   CARE MANAGEMENT NOTE 04/18/2013  Patient:  [name], [name]   Account Number:  [id number]  Date Initiated:  04/18/2013  Documentation initiated by:  Avie Arenas  Subjective/Objective Assessment:   Admitted with STEMI     Action/Plan:   Anticipated DC Date:  04/21/2013   Anticipated DC Plan:  HOME/SELF CARE      DC Planning Services  CM consult  Medication Assistance      Choice offered to / List presented to:             Status of service:  In process, will continue to follow Medicare Important Message given?   (If response is "NO", the following Medicare IM given date fields will be blank) Date Medicare IM given:   Date Additional Medicare IM given:    Discharge Disposition:    Per UR Regulation:  Reviewed for med. necessity/level of care/duration of stay  If discussed at Long Length of Stay Meetings, dates discussed:    Comments:  Contact:  Cummings,Vivian Sister [phone]                 Johnson,Steven Significant other [phone]

## 2013-04-18 NOTE — Progress Notes (Addendum)
       Patient Name: [name] Date of Encounter: 04/18/2013    SUBJECTIVE: [name] is back in the hospital with a recurrence of saphenous vein graft occlusion. The vein graft to the circumflex has been previously stented under similar circumstances in August. She is financially destitute and was unable to take any of her medications for diabetes, hypertension, coronary disease, or hyperlipidemia. She continues to smoke cigarettes. She denies chest pain. She is ablated this morning without difficulty. She is asking to go home.  TELEMETRY:  Normal sinus rhythm: Filed Vitals:   04/18/13 0400 04/18/13 0407 04/18/13 0600 04/18/13 0800  BP: 151/88  117/85 150/89  Pulse:      Temp: 98.3 F (36.8 C)   98.8 F (37.1 C)  TempSrc: Oral   Oral  Resp: 18     Height:      Weight:  212 lb 8.4 oz (96.4 kg)    SpO2: 96%   100%    Intake/Output Summary (Last 24 hours) at 04/18/13 1149 Last data filed at 04/18/13 0400  Gross per 24 hour  Intake 1193.9 ml  Output      0 ml  Net 1193.9 ml    LABS: Basic Metabolic Panel:  Recent Labs  43/56/86 0020 04/17/13 0300  NA 144 140  K 3.6* 4.2  CL 107 103  CO2  --  23  GLUCOSE 117* 110*  BUN 17 15  CREATININE 1.20* 1.06  CALCIUM  --  8.4  MG  --  1.7   CBC:  Recent Labs  04/17/13 0009 04/17/13 0020 04/17/13 0300 04/17/13 1000  WBC 7.0  --  9.9  --   NEUTROABS  --   --  7.7  --   HGB 13.9 13.9 13.4  --   HCT 40.9 41.0 39.0  --   MCV 89.1  --  88.6  --   PLT 254  --  229 228   Cardiac Enzymes:  Recent Labs  04/17/13 0218 04/17/13 1000 04/17/13 1525  TROPONINI >20.00* >20.00* >20.00*   Fasting Lipid Panel:  Recent Labs  04/17/13 0500  CHOL 239*  HDL 39*  LDLCALC 151*  TRIG 243*  CHOLHDL 6.1    Radiology/Studies:  Not repeated  Physical Exam: Blood pressure 150/89, pulse 72, temperature 98.8 F (37.1 C), temperature source Oral, resp. rate 18, height 5\' 8"  (1.727 m), weight 212 lb 8.4 oz (96.4 kg), SpO2  100.00%. Weight change: -12 lb 7.6 oz (-5.659 kg)   Basal crackles bilaterally Moderate JVD Left femoral cath site is unremarkable Cardiac exam reveals no murmur or rub. Neurological exam is intact    ASSESSMENT:  1. Acute lateral wall STEMI do to occlusion of the saphenous vein graft to the obtuse marginal. This is a recurrent event now 7 months after a similar presentation. She continues to smoke cigarettes and is unable to afford therapy for risk factor modification.  2. Significant hypertension, poorly controlled 3. Combined systolic and diastolic heart failure, compensated   Plan:  1. Ambulate  2. Case management consult. Will need at least 4 weeks of complimentary Plavix therapy. She should be on dual antiplatelet therapy for at least 12 months. 3. Transfer to telemetry 4. Discharge in 24-48 hours.  5. Add low-dose ACE and beta blocker therapy   Signed, Lesleigh Noe 04/18/2013, 11:49 AM

## 2013-04-19 DIAGNOSIS — I10 Essential (primary) hypertension: Secondary | ICD-10-CM

## 2013-04-19 DIAGNOSIS — E785 Hyperlipidemia, unspecified: Secondary | ICD-10-CM

## 2013-04-19 LAB — BASIC METABOLIC PANEL
BUN: 16 mg/dL (ref 6–23)
CO2: 24 mEq/L (ref 19–32)
Calcium: 9 mg/dL (ref 8.4–10.5)
Chloride: 99 mEq/L (ref 96–112)
Creatinine, Ser: 0.97 mg/dL (ref 0.50–1.10)
GFR calc Af Amer: 71 mL/min — ABNORMAL LOW (ref >90)
GFR, EST NON AFRICAN AMERICAN: 61 mL/min — AB (ref >90)
Glucose, Bld: 89 mg/dL (ref 70–99)
POTASSIUM: 4 meq/L (ref 3.7–5.3)
SODIUM: 137 meq/L (ref 137–147)

## 2013-04-19 LAB — CBC
HCT: 38.8 % (ref 36.0–46.0)
HEMOGLOBIN: 13.4 g/dL (ref 12.0–15.0)
MCH: 30.9 pg (ref 26.0–34.0)
MCHC: 34.5 g/dL (ref 30.0–36.0)
MCV: 89.4 fL (ref 78.0–100.0)
Platelets: 213 10*3/uL (ref 150–400)
RBC: 4.34 MIL/uL (ref 3.87–5.11)
RDW: 14 % (ref 11.5–15.5)
WBC: 6.4 10*3/uL (ref 4.0–10.5)

## 2013-04-19 LAB — TROPONIN I: TROPONIN I: 7.7 ng/mL — AB (ref <0.30)

## 2013-04-19 NOTE — Progress Notes (Addendum)
[id number] Cardiac Rehab Pt states that she has walked in hall this am and denies any cp or SOB. Completed MI and stent education with pt. She voices understanding. Discussed smoking cessation with pt. I gave her tips for quitting, coaching contact number and quit smart class information. Pt declines Outpt. CRP due to her work hours and no insurance. She does feel that she will be able to afford her medications if they are generic. Pt states that she quit smoking after her first MI and stayed quit for 4-5 months. We discussed triggers and reasons for relapse. She seems motivated to quit and states that she has. I have encouraged her comply with medications, follow her heart healthy diet and to do regular exercise. I put MI video on for her to watch. Beatrix Fetters, RN 04/19/2013 10:44 AM

## 2013-04-19 NOTE — Progress Notes (Addendum)
Patient Name: [name] Date of Encounter: 04/19/2013     Principal Problem:   STEMI (ST elevation myocardial infarction)    SUBJECTIVE  Ready to go home. Walked the halls with no issues this AM. Committed to taking her medications and quitting smoking.   CURRENT MEDS . aspirin  81 mg Oral Daily  . atorvastatin  80 mg Oral q1800  . carvedilol  3.125 mg Oral BID WC  . clopidogrel  75 mg Oral Q breakfast  . enoxaparin (LOVENOX) injection  40 mg Subcutaneous Q24H  . influenza vac split quadrivalent PF  0.5 mL Intramuscular Tomorrow-1000  . lisinopril  10 mg Oral Daily  . pantoprazole  40 mg Oral Daily  . pneumococcal 23 valent vaccine  0.5 mL Intramuscular Tomorrow-1000    OBJECTIVE  Filed Vitals:   04/18/13 1341 04/18/13 2114 04/19/13 0500 04/19/13 0834  BP: 128/69 144/77  129/77  Pulse: 68 65  74  Temp: 98.4 F (36.9 C) 97.8 F (36.6 C)    TempSrc: Oral Oral    Resp: 20 18    Height:      Weight:   219 lb 2.2 oz (99.4 kg)   SpO2: 99% 98%      Intake/Output Summary (Last 24 hours) at 04/19/13 1034 Last data filed at 04/18/13 2115  Gross per 24 hour  Intake   1080 ml  Output      0 ml  Net   1080 ml   Filed Weights   04/26/13 0305 04/18/13 0407 04/19/13 0500  Weight: 220 lb 0.3 oz (99.8 kg) 212 lb 8.4 oz (96.4 kg) 219 lb 2.2 oz (99.4 kg)    PHYSICAL EXAM  General: Pleasant, NAD. Neuro: Alert and oriented X 3. Moves all extremities spontaneously. Psych: Normal affect. HEENT:  Normal  Neck: Supple without bruits or JVD. Lungs:  Resp regular and unlabored, CTA. Heart: RRR no s3, s4, or murmurs. Abdomen: Soft, non-tender, non-distended, BS + x 4.  Extremities: No clubbing, cyanosis or edema. DP/PT/Radials 2+ and equal bilaterally.  Accessory Clinical Findings  CBC  Recent Labs  2013/04/26 0009 04/26/2013 0020 2013/04/26 0300 04/26/2013 1000  WBC 7.0  --  9.9  --   NEUTROABS  --   --  7.7  --   HGB 13.9 13.9 13.4  --   HCT 40.9 41.0 39.0  --   MCV  89.1  --  88.6  --   PLT 254  --  229 228   Basic Metabolic Panel  Recent Labs  04-26-13 0020 04-26-2013 0300  NA 144 140  K 3.6* 4.2  CL 107 103  CO2  --  23  GLUCOSE 117* 110*  BUN 17 15  CREATININE 1.20* 1.06  CALCIUM  --  8.4  MG  --  1.7   Liver Function Tests  Recent Labs  04/26/13 0300  AST 121*  ALT 41*  ALKPHOS 94  BILITOT <0.2*  PROT 6.2  ALBUMIN 3.2*    Cardiac Enzymes  Recent Labs  04/26/13 0218 April 26, 2013 1000 Apr 26, 2013 1525  TROPONINI >20.00* >20.00* >20.00*    Fasting Lipid Panel  Recent Labs  04/26/13 0500  CHOL 239*  HDL 39*  LDLCALC 151*  TRIG 243*  CHOLHDL 6.1    TELE  NSR   Radiology/Studies  CARDIAC CATHETERIZATION 04/26/13  History obtained from chart review..[name] is a 63 year old Caucasian female patient of Dr. Erskine Emery Smith's history of CAD status post coronary artery bypass grafting in 2006 with LIMA  to her LAD, RIMA to RCA and a vein graft to her obtuse marginal branch. Her cardiac catheterization showed left main/three-vessel disease. She has a history of peripheral vascular disease status post endarterectomy, right femoropopliteal bypass grafting and bilateral iliac stenting. She has a history of hypertension and continued tobacco abuse but she has not taken her medications for quite some time or seen a cardiologist because of financial constraints. She is complaining of chest pain off and on for last 2 weeks worse today. She presented to [facility] emergency room where she had inferior ST segment elevation and reciprocal anterior depression. She was seen by the ER physician and Dr. Everette Rank who both agree this was a STEMI. The patient was brought to the Cath Lab for emergency catheterization and intervention.  PROCEDURE DESCRIPTION:  The patient was brought to the second floor [place] [facility] cath lab in the postabsorptive state. She was premedicated with IV fentanyl and Versed. Her left groinwas prepped and  shaved in usual sterile fashion. Xylocaine 1% was used for local anesthesia. A 6 French sheath was inserted into the left common femoral artery using standard Seldinger technique.6 French right and left Judkins diagnostic catheters Lopid 600 pigtail catheter was used for selective coronary angiography, left ventriculography, selective vein graft angiography, selective internal mammary artery angiography. Visipaque dye was used for the entirety of the case. Retrograde aortic and left ventricular end pullback pressures were recorded.  HEMODYNAMICS:  AO SYSTOLIC/AO DIASTOLIC: 168/89  LV SYSTOLIC/LV DIASTOLIC: 178/38  ANGIOGRAPHIC RESULTS:  1. Left main; 60% distal  2. LAD; diffusely diseased with segmental 90% mid just prior to the LIMA insertion. There was retrograde filling of the LIMA.  3. Left circumflex; 50-70% segmental proximal. The first obtuse marginal branch was occluded.  4. Right coronary artery; was not visualized but known to be totally occluded  5.LIMA TO LAD; widely patent  RIMA to the RCA: Widely patent  6. SVG TO obtuse marginal branch was totally occluded and was the infarct related artery.  7. Left ventriculography; RAO left ventriculogram was performed using  25 mL of Visipaque dye at 12 mL/second. The overall LVEF estimated  40 % With wall motion abnormalities notable for severe inferoapical and posterolateral hypokinesia.  IMPRESSION:[name] has occluded her circumflex SVG. Will proceed with attempt at PCI and stenting using bare-metal stent, Angiomax and Plavix..  Procedure description:the patient received Angiomax bolus with an ACT of 459. A total of 150 cc of contrast was administered during the case. Using a 6 [place] JR 4 guide catheter along with an 014/190 cm long pro water guidewire a 2.0 x 12 mm long balloon PTCA was performed of the occluded SVG. Following this a 4 mm spider distal protection device was then placed in the distal SVG up of the distal anastomosis.  Overlapping rebel bare-metal stents were then deployed across the proximal diseased segment (3.5 x 32,3.5 X 18) Nitroglycerin was also started because of the elevated left ventricular end-diastolic pressure and systolic blood pressure. IV Lasix was given as well.  Final impression: Inferolateral STEMI secondary to occluded SVG to percutaneous revascularization with overlapping bare metal stents and a door to balloon time of 28 minutes. Because of what appeared to be intraluminal filling defect suggestive of thrombus the patient was started on Aggrastat. She does continue to smoke which probably contributes to her disease and she stopped her medicine because of financial constraints. I will continue the full dosage of Angiomax for 4 hours, Aggrastat for 18 hours  of amiodarone until tomorrow which can be discontinued if she has no further arrhythmias. She left the Cath Lab in stable condition.   ASSESSMENT AND PLAN 63 y/o F with h/o CAD s/p CABG in 2006 (LIMA -> LAD, RIMA -> RCA, and SVG -> OM), STEMI in 09/2012 with emergent PCI (BMS to SVG -> OM), PVD s/p left subclavian artery stent, bilateral iliac stents, and right fem-pop bypass, HTN, continued tobacco abuse, non compliance and GERD, who presented [facility] ED on 04/17/13 with STEMI and was taken for emergent cardiac cath.   STEMI: Cath showed Left main: 60% distal, LAD: diffusely diseased with segmental 90% mid just prior to the LIMA insertion. There was retrograde filling of the LIMA. Left circumflex: 50-70% segmental proximal. The first obtuse marginal branch was occluded. Right coronary artery: was not visualized but known to be totally occluded. LIMA TO LAD: widely patent. RIMA to the RCA: Widely patent, SVG to obtuse marginal branch was totally occluded and was the infarct related artery.The overall LVEF estimated 40 % with severe inferoapical and posterolateral hypokinesia. She underwent PCI with overlapping BMS.  - Continue ASA /statin/beta  blocker/Plavix - case management consulted to help her obtain complimentary DAPT with plavix for at least 1 mo. - TTE pending   Mild to moderate LV dysfunction EF 40% with severe inferoapical and poterolateral HK  - ACE and BB added yesterday  - TTE pending  HTN: BP well controlled   HLD- continue statin  Arrhythmias during cath (not stated in note what they were but placed on Amio)  - Amio D/Cd yesterday, maintaining NSR.  Tobacco abuse ongoing  - smoking cessation counseling ordered. She is committed to quitting this time  Medical noncompliance due to financial issues. Care mgmt to help receive medications  Signed, [name], [name]  Pager [phone]  I seen and evaluated the patient  this afternoon along with [name], [name]. Agree with her findings, examination recommendations.  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 C. status post inferolateral STEMI with PCI to the SVG-OM.  Troponin has finally dropped. She does a reduced EF by LV gram. I would like to recheck an echocardiogram prior to discharge. Unfortunately it would not be would be done today.. She can be done as an outpatient so as not to delay discharge.  She's been relatively stable here in the hospital.  She is ambulating without any difficulty. She had some interesting shortness of breath spells that she thinks may be related to her medications.  We'll monitor for cough with her being on lisinopril.  Otherwise she is on a good dose of beta blocker along with aspirin plus Plavix.  Despite discharge in the morning. An echo can be done prior to discharge will review, if not can be ordered as an outpatient.  [name],[name], M.D., M.S. Interventional Cardiologist  [facility] Pager # [phone] 04/19/2013

## 2013-04-20 ENCOUNTER — Other Ambulatory Visit: Payer: Self-pay | Admitting: Physician Assistant

## 2013-04-20 ENCOUNTER — Telehealth: Payer: Self-pay | Admitting: Interventional Cardiology

## 2013-04-20 DIAGNOSIS — I519 Heart disease, unspecified: Secondary | ICD-10-CM

## 2013-04-20 DIAGNOSIS — I219 Acute myocardial infarction, unspecified: Secondary | ICD-10-CM

## 2013-04-20 DIAGNOSIS — I10 Essential (primary) hypertension: Secondary | ICD-10-CM | POA: Diagnosis present

## 2013-04-20 MED ORDER — LISINOPRIL 20 MG PO TABS: 20.0000 mg | ORAL_TABLET | Freq: Every day | ORAL | Status: DC

## 2013-04-20 MED ORDER — CARVEDILOL 3.125 MG PO TABS: 3.1250 mg | ORAL_TABLET | Freq: Two times a day (BID) | ORAL | Status: DC

## 2013-04-20 MED ORDER — ATORVASTATIN CALCIUM 80 MG PO TABS: 80.0000 mg | ORAL_TABLET | Freq: Every day | ORAL | Status: DC

## 2013-04-20 MED ORDER — PANTOPRAZOLE SODIUM 20 MG PO TBEC: 20.0000 mg | DELAYED_RELEASE_TABLET | Freq: Every day | ORAL | Status: DC

## 2013-04-20 MED ORDER — CLOPIDOGREL BISULFATE 75 MG PO TABS: 75.0000 mg | ORAL_TABLET | Freq: Every day | ORAL | Status: DC

## 2013-04-20 NOTE — Discharge Instructions (Signed)

## 2013-04-20 NOTE — Progress Notes (Signed)
PT DISCHARGE HOME WITH FAMILY. DISCHARGE INSTRUCTIONS REVIEWED. PT VU.

## 2013-04-20 NOTE — Discharge Summary (Signed)
Discharge Summary   Patient ID: [name] MRN: [id number], DOB/AGE: Jan 28, 1951 63 y.o. Admit date: 04/16/2013 D/C date:     04/20/2013  Primary Cardiologist: Dr. Katrinka Blazing   Primary problem:   STEMI (ST elevation myocardial infarction) -s/p PCI w/ overlapping BMS to the SVG-OM.   Active Problems:      HYPERLIPIDEMIA   TOBACCO ABUSE   CEREBROVASCULAR DISEASE-RIGHT CEA   Peripheral arterial disease   PERIPHERAL VASCULAR DISEASE   GASTROESOPHAGEAL REFLUX DISEASE   Essential hypertension   Morbid obesityBody mass index is 33.23 kg/(m^2).  Admission Dates: 04/17/13- 04/20/13 Discharge Diagnosis: inferolateral STEMI s/p PCI w/ overlapping BMS to the SVG-OM.    HPI: [name] is a 63 y.o. female with a history of CAD s/p CABG in 2006 (LIMA -> LAD, RIMA -> RCA, and SVG -> OM), STEMI in 09/2012 with emergent PCI (BMS to SVG -> OM), PVD s/p left subclavian artery stent, bilateral iliac stents, and right fem-pop bypass, HTN, continued tobacco abuse, non compliance and GERD who presented to the [facility] ED on 04/17/13 with inferolateral STEMI and was taken for emergent cardiac cath.   Hospital Course: STEMI: She complained of severe chest pain associated with nausea and EKG revealed NSR with 1 mm ST elevations in II, III, aVF, and V6 and 1-2 mm ST depressions in V1-V2. Her troponin was elevated >20. A code STEMI was called. Cardiac catheterization revealed left main: 60% distal, LAD: diffusely diseased with segmental 90% mid just prior to the LIMA insertion. There was retrograde filling of the LIMA. Left circumflex: 50-70% segmental proximal. The first obtuse marginal branch was occluded. Right coronary artery: was not visualized but known to be totally occluded. LIMA to LAD: widely patent. RIMA to the RCA: Widely patent, SVG to obtuse marginal branch was totally occluded and was the infarct related artery.The overall LVEF estimated 40 % with severe inferoapical and posterolateral hypokinesia. She underwent  PCI with overlapping BMS. She will continue ASA/Plavix, statin, beta blocker and case management consulted to help her obtain complimentary DAPT with plavix for at least 1 mo and other medications.   Mild to moderate LV dysfunction: EF 40% with severe inferoapical and poterolateral hypokinesis on cardiac catheterization.   An ACE and BB were added yesterday which was tolerated well. A 2D ECHO was planned for this admission to assess LV function. However, with schedule delays it was elected to proceed with an outpatient ECHO, which has been scheduled this for this week.   HTN: Her blood pressure was mildly elevated and so her newly added lisinopril was increased from 10mg  to 20mg  daily. Her HR was ~60, so her BB was kept at her current dose.   HLD: A lipid panel was done on this admission which showed uncontrolled hyperlipidemia. Her TC:239, HDL: 39, LDL: 151 and TG 234. Hopefully this will improve when she resumes taking a statin.  Arrhythmias NOS: She was noted to have an arrhythmia during her heart cath; however, it was not stated specifically which one. However, she was placed on Amio, which was discontinued the next day. She has been maintaining NSR since that time.   Tobacco abuse ongoing: Smoking cessation counseling was emphasized on this admission. She is committed to quitting this time.   Medical noncompliance: due to financial issues. Care mgmt consulted to help her receive her medications   The patient has had an uncomplicated hospital course and is recovering well. The femoral catheter site is healing well. She has been seen  by Dr. Herbie Baltimore today and deemed stable for discharge home. Smoking cessation and medical compliance was disscussed in length. Discharge medications include ASA/Plavix, atorvastatin 80, carvedilol 3.125 BID, lisinopril 20, and NTG. She was advised to stop omeprazole while on plavix and was provided with a Rx for protonix. She was given a medical note to be out of work  for 3 weeks. We recommended [facility], but she does not think her work schedule will accommodate it. All follow-up appointments have been scheduled, including a 2D ECHO.   Discharge Vitals: Blood pressure 151/78, pulse 60, temperature 98.5 F (36.9 C), temperature source Oral, resp. rate 18, height 5\' 8"  (1.727 m), weight 218 lb 7.6 oz (99.1 kg), SpO2 97.00%.  Labs: Lab Results  Component Value Date   WBC 6.4 04/19/2013   HGB 13.4 04/19/2013   HCT 38.8 04/19/2013   MCV 89.4 04/19/2013   PLT 213 04/19/2013     Recent Labs Lab 04/17/13 0300 04/19/13 1112  NA 140 137  K 4.2 4.0  CL 103 99  CO2 23 24  BUN 15 16  CREATININE 1.06 0.97  CALCIUM 8.4 9.0  PROT 6.2  --   BILITOT <0.2*  --   ALKPHOS 94  --   ALT 41*  --   AST 121*  --   GLUCOSE 110* 89    Recent Labs  04/19/13 1112  TROPONINI 7.70*   Lab Results  Component Value Date   CHOL 239* 04/17/2013   HDL 39* 04/17/2013   LDLCALC 151* 04/17/2013   TRIG 243* 04/17/2013     Diagnostic Studies/Procedures   No results found.  Discharge Medications     Medication List    STOP taking these medications       omeprazole 20 MG capsule  Commonly known as:  PRILOSEC     Ticagrelor 90 MG Tabs tablet  Commonly known as:  BRILINTA      TAKE these medications       aspirin 81 MG tablet  Take 81 mg by mouth daily.     atorvastatin 80 MG tablet  Commonly known as:  LIPITOR  Take 1 tablet (80 mg total) by mouth daily at 6 PM.     carvedilol 3.125 MG tablet  Commonly known as:  COREG  Take 1 tablet (3.125 mg total) by mouth 2 (two) times daily with a meal.     clopidogrel 75 MG tablet  Commonly known as:  PLAVIX  Take 1 tablet (75 mg total) by mouth daily with breakfast.     lisinopril 20 MG tablet  Commonly known as:  PRINIVIL,ZESTRIL  Take 1 tablet (20 mg total) by mouth daily.  Start taking on:  04/21/2013     nitroGLYCERIN 0.4 MG SL tablet  Commonly known as:  NITROSTAT  Place 1 tablet (0.4 mg total) under the  tongue every 5 (five) minutes x 3 doses as needed for chest pain.     pantoprazole 20 MG tablet  Commonly known as:  PROTONIX  Take 1 tablet (20 mg total) by mouth daily.        Disposition   The patient will be discharged in stable condition to home. Discharge Orders   Future Appointments Provider Department Dept Phone   04/22/2013 2:00 PM [facility] Echo Echo 1 [facility] CARDIOVASCULAR IMAGING ECHO [address] [phone]   04/27/2013 12:10 PM Beatrice Lecher, PA-C [facility] [phone]   Future Orders Complete By Expires   Amb Referral to Cardiac  Rehabilitation  As directed      Follow-up Information   Follow up with [facility] On 04/22/2013. (2pm )    Contact information:   [address], [address] [place] [address] [phone]      Follow up with Tereso NewcomerScott Weaver, PA-C On 04/27/2013. (@12 :00)    Specialty:  Physician Assistant   Contact information:   [address] [place] [address] [phone]         Duration of Discharge Encounter: Greater than 30 minutes including physician and PA time.  SignedThereasa Parkin, STERN, KATHRYN PA-C 04/20/2013, 3:35 PM   Patient seen and evaluated on the day of discharge. 6 she was stable disorder current medications. Appropriate followup scheduled.  I agree with the summary above.   Marykay LexHARDING,DAVID W, M.D., M.S. Interventional Cardiologist  [facility] Pager # [phone] 04/20/2013

## 2013-04-20 NOTE — Progress Notes (Addendum)
Patient Name: [name] Date of Encounter: 04/20/2013     Active Problems:   ST elevation myocardial infarction (STEMI) of lateral wall   HYPERLIPIDEMIA   PERIPHERAL VASCULAR DISEASE   Essential hypertension    SUBJECTIVE  No chest pain. She does have some SOB that she thinks is related to her medications. But is feeling well.   CURRENT MEDS . aspirin  81 mg Oral Daily  . atorvastatin  80 mg Oral q1800  . carvedilol  3.125 mg Oral BID WC  . clopidogrel  75 mg Oral Q breakfast  . enoxaparin (LOVENOX) injection  40 mg Subcutaneous Q24H  . influenza vac split quadrivalent PF  0.5 mL Intramuscular Tomorrow-1000  . lisinopril  10 mg Oral Daily  . pantoprazole  40 mg Oral Daily  . pneumococcal 23 valent vaccine  0.5 mL Intramuscular Tomorrow-1000    OBJECTIVE  Filed Vitals:   04/19/13 1117 04/19/13 1319 04/19/13 2100 04/20/13 0500  BP: 179/86 144/73 147/75 151/78  Pulse:  68 74 60  Temp:  98.2 F (36.8 C) 98.5 F (36.9 C) 98.5 F (36.9 C)  TempSrc:  Oral Oral Oral  Resp:  18 18 18   Height:      Weight:    218 lb 7.6 oz (99.1 kg)  SpO2:  98% 98% 97%    Intake/Output Summary (Last 24 hours) at 04/20/13 1031 Last data filed at 04/20/13 0800  Gross per 24 hour  Intake    720 ml  Output    500 ml  Net    220 ml   Filed Weights   04/18/13 0407 04/19/13 0500 04/20/13 0500  Weight: 212 lb 8.4 oz (96.4 kg) 219 lb 2.2 oz (99.4 kg) 218 lb 7.6 oz (99.1 kg)    PHYSICAL EXAM  General: Pleasant, NAD. Neuro: Alert and oriented X 3. Moves all extremities spontaneously. Psych: Normal affect. HEENT:  Normal  Neck: Supple without bruits or JVD. Lungs:  Resp regular and unlabored, CTA. Heart: RRR no s3, s4, or murmurs. Abdomen: Soft, non-tender, non-distended, BS + x 4.  Extremities: No clubbing, cyanosis or edema. DP/PT/Radials 2+ and equal bilaterally.  Accessory Clinical Findings  CBC  Recent Labs  04/19/13 1112  WBC 6.4  HGB 13.4  HCT 38.8  MCV 89.4  PLT  213   Basic Metabolic Panel  Recent Labs  04/19/13 1112  NA 137  K 4.0  CL 99  CO2 24  GLUCOSE 89  BUN 16  CREATININE 0.97  CALCIUM 9.0   Cardiac Enzymes  Recent Labs  04/17/13 1525 04/19/13 1112  TROPONINI >20.00* 7.70*   TELE  NSR   Radiology/Studies  No results found.  ASSESSMENT AND PLAN 63 y/o F with h/o CAD s/p CABG in 2006 (LIMA -> LAD, RIMA -> RCA, and SVG -> OM), STEMI in 09/2012 with emergent PCI (BMS to SVG -> OM), PVD s/p left subclavian artery stent, bilateral iliac stents, and right fem-pop bypass, HTN, continued tobacco abuse, non compliance and GERD, who presented to the [facility] ED on 04/17/13 with STEMI and was taken for emergent cardiac cath.  Active Problems:   ST elevation myocardial infarction (STEMI) of lateral wall   HYPERLIPIDEMIA   PERIPHERAL VASCULAR DISEASE   Essential hypertension  STEMI: s/p inferolateral STEMI with PCI to the SVG-OM. Cath showed Left main: 60% distal, LAD: diffusely diseased with segmental 90% mid just prior to the LIMA insertion. There was retrograde filling of the LIMA. Left circumflex: 50-70% segmental proximal. The  first obtuse marginal branch was occluded. Right coronary artery: was not visualized but known to be totally occluded. LIMA TO LAD: widely patent. RIMA to the RCA: Widely patent, SVG to obtuse marginal branch was totally occluded and was the infarct related artery.The overall LVEF estimated 40 % with severe inferoapical and posterolateral hypokinesia. She underwent PCI with overlapping BMS.  - Continue ASA /statin/beta blocker/Plavix - case management consulted to help her obtain complimentary DAPT with plavix for at least 1 mo.  - Troponin dropped to 7.7 from over 20   Mild to moderate LV dysfunction EF 40% with severe inferoapical and poterolateral HK  - ACE and BB added yesterday  - TTE as an outpatient  HTN: BP a little elevated  HLD- continue statin   Arrhythmias during cath (not stated in note what  they were but placed on Amio)  - Amio D/Cd yesterday, maintaining NSR.   Tobacco abuse ongoing  - smoking cessation counseling ordered. She is committed to quitting this time   Medical noncompliance due to financial issues. Care mgmt to help receive medications    Signed, Sena SlateHARDING,DAVID W PA-C  Pager [phone]  I have seen and evaluated the patient this AM along withSTERN, KATHRYN PA-C . I agree with her findings, examination as well as impression recommendations.  She looks & feels great today.   BP a bit up -- can increase ACE-I to 20 mg daily. (HR ~60, so keep BB @ current dose). Recommended [facility] - will need to determine if she goes back to work b/c work schedule will not accomodate. Needs to be OOW x at least 3 weeks until seen by Dr. Katrinka BlazingSmith or PA.  Continue DAPT - try for minimum of 1 yr.  Consider [facility] 1 yr supply Rx as OP.  Would have liked to have Echo to better assess EF - but not able to be done yesterday do to volume of orders.  Can be done as OP prior to f/u appt.  OK for d/c today.  ROV with Dr. Katrinka BlazingSmith or PA in ~2 weeks.   Marykay LexHARDING,DAVID W, M.D., M.S. Interventional Cardiologist  [facility] Pager # [phone] 04/20/2013

## 2013-04-20 NOTE — Telephone Encounter (Signed)
New message    7 days TCM with Tereso Newcomer 3/25 @ 12:10 pm per Hilaria Ota.

## 2013-04-20 NOTE — Progress Notes (Signed)
[id number] Cardiac Rehab Discussed with pt again Outpt. CRP with pt again. She agrees to a referral to the [facility] program. I gave her financial assistant form to fill out. Pt is concerned about paying her hospital bills. I contacted financial counselor to come to see her. Beatrix Fetters, RN 04/20/2013 12:40 PM

## 2013-04-20 NOTE — Progress Notes (Signed)
Echocardiogram 2D Echocardiogram has been performed.  [name] 04/20/2013, 11:59 AM

## 2013-04-21 NOTE — Telephone Encounter (Signed)
LMTCB

## 2013-04-21 NOTE — Telephone Encounter (Signed)
Patient contacted regarding discharge from [place] on 04/20/12  Patient understands to follow up with provider scott weaver pa on 04/27/12 at 12:10 am [address]. Patient understands discharge instructions? yes Patient understands medications and regiment? yes  Patient understands to bring all medications to this visit? yes   Pt had several questions regarding bill and what she would be required to pay at appt time. Leanne walters in registration is going to call and discuss with the pt.

## 2013-04-21 NOTE — Telephone Encounter (Signed)
Follow up     Patient returning call back to nurse Thurston Hole.

## 2013-04-22 ENCOUNTER — Other Ambulatory Visit (HOSPITAL_COMMUNITY): Payer: Self-pay

## 2013-04-27 ENCOUNTER — Encounter: Payer: Self-pay | Admitting: Physician Assistant

## 2013-05-03 ENCOUNTER — Ambulatory Visit: Payer: Self-pay

## 2013-05-04 ENCOUNTER — Ambulatory Visit: Payer: Self-pay | Attending: Internal Medicine | Admitting: Internal Medicine

## 2013-05-04 VITALS — BP 182/83 | HR 60 | Temp 98.6°F | Resp 16 | Ht 68.0 in | Wt 220.0 lb

## 2013-05-04 DIAGNOSIS — I219 Acute myocardial infarction, unspecified: Secondary | ICD-10-CM | POA: Insufficient documentation

## 2013-05-04 DIAGNOSIS — B078 Other viral warts: Secondary | ICD-10-CM | POA: Insufficient documentation

## 2013-05-04 DIAGNOSIS — I10 Essential (primary) hypertension: Secondary | ICD-10-CM | POA: Insufficient documentation

## 2013-05-04 DIAGNOSIS — A630 Anogenital (venereal) warts: Secondary | ICD-10-CM

## 2013-05-04 DIAGNOSIS — I213 ST elevation (STEMI) myocardial infarction of unspecified site: Secondary | ICD-10-CM

## 2013-05-04 MED ORDER — POTASSIUM CHLORIDE ER 10 MEQ PO TBCR: 10.0000 meq | EXTENDED_RELEASE_TABLET | Freq: Every day | ORAL | Status: DC

## 2013-05-04 MED ORDER — MOMETASONE FURO-FORMOTEROL FUM 100-5 MCG/ACT IN AERO: 2.0000 | INHALATION_SPRAY | Freq: Two times a day (BID) | RESPIRATORY_TRACT | Status: DC

## 2013-05-04 MED ORDER — FUROSEMIDE 40 MG PO TABS: 40.0000 mg | ORAL_TABLET | Freq: Every day | ORAL | Status: DC

## 2013-05-04 NOTE — Progress Notes (Signed)
Pt is here to establish care. HFU Pt is following up on her CHF.

## 2013-05-04 NOTE — Progress Notes (Signed)
Patient ID: [name], female   DOB: 1950-12-06, 63 y.o.   MRN: [id number]   CC:  HPI:  63 year old female with a history of coronary artery disease status post CABG in 2006,STEMI in 09/2012 with emergent PCI, PVD s/p left subclavian artery stent, bilateral iliac stents, and right fem-pop bypass, HTN, and GERD, who presented to ED via EMS with severe chest pain .Upon arrival, EMS found the patient to have 1-2 mm inferior ST elevations with anterior ST depressions, prompting STEMI alert to be sent out. Patient was taken for emergent left heart cath.LVEF estimated 40 % with severe inferoapical and posterolateral hypokinesia. She underwent PCI with overlapping BMS. EF 40% with severe inferoapical and poterolateral hypokinesis on cardiac catheterization. An ACE and BB were added.  TC:239, HDL: 39, LDL: 151 and TG 234  She was advised to stop omeprazole while on plavix and was provided with a Rx for protonix. She was given a medical note to be out of work for 3 weeks   Today the patient presents with shortness of breath on exertion, the patient is concerned that she is on a diuretic. She notices occasional dependent edema. she is hypertensive today with systolic blood pressure greater than 180. She had an appointment with cardiology on 3/25 but could not make it because of her $300 fee upfront . She denies any chest pain. She quit smoking at the time of discharge from the hospital.  She had colonoscopy 4 years ago and had 2 polyps removed and was asked to come back in 5-7 years  She is concerned about not having a Pap smear, but has a history of genital warts. She's status post hysterectomy. Has not had a mammogram in several years   Allergies  Allergen Reactions  . Codeine Itching    welts  . Morphine Itching    Welts    Past Medical History  Diagnosis Date  . ASCVD (arteriosclerotic cardiovascular disease)     CABG in 2006   . PVD (peripheral vascular disease)   . Cerebrovascular  disease   . Hypertension   . Tobacco abuse   . GERD (gastroesophageal reflux disease)   . H. pylori infection   . Hepatitis   . Diverticulitis   . DJD (degenerative joint disease) of cervical spine   . Migraine headache   . Coronary artery disease    Current Outpatient Prescriptions on File Prior to Visit  Medication Sig Dispense Refill  . aspirin 81 MG tablet Take 81 mg by mouth daily.        Marland Kitchen atorvastatin (LIPITOR) 80 MG tablet Take 1 tablet (80 mg total) by mouth daily at 6 PM.  30 tablet  12  . carvedilol (COREG) 3.125 MG tablet Take 1 tablet (3.125 mg total) by mouth 2 (two) times daily with a meal.  60 tablet  12  . clopidogrel (PLAVIX) 75 MG tablet Take 1 tablet (75 mg total) by mouth daily with breakfast.  30 tablet  12  . lisinopril (PRINIVIL,ZESTRIL) 20 MG tablet Take 1 tablet (20 mg total) by mouth daily.  30 tablet  12  . nitroGLYCERIN (NITROSTAT) 0.4 MG SL tablet Place 1 tablet (0.4 mg total) under the tongue every 5 (five) minutes x 3 doses as needed for chest pain.  25 tablet  3  . pantoprazole (PROTONIX) 20 MG tablet Take 1 tablet (20 mg total) by mouth daily.  30 tablet  12   No current facility-administered medications on file prior to visit.  Family History  Problem Relation Age of Onset  . Pneumonia Mother   . Coronary artery disease Father   . Heart disease Sister   . Colon cancer Maternal Aunt     Great aunt, rectal  . Diabetes Maternal Uncle   . Heart disease Maternal Uncle   . Breast cancer Maternal Grandmother   . Heart disease Other     Father's side of family   History   Social History  . Marital Status: Single    Spouse Name: N/A    Number of Children: N/A  . 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 Smoking status: Former Smoker -- 0.50 packs/day  . Smokeless tobacco: Former NeurosurgeonUser    Quit date: 04/17/2013     Comment: 1/2 pack per day since 2005, 40 pack  years  . Alcohol Use: No  . Drug Use: No  . Sexual Activity: Yes   Other Topics Concern  . Not on file   Social History Narrative   Divorced, no children    Review of Systems  Constitutional: Negative for fever, chills, diaphoresis, activity change, appetite change and fatigue.  HENT: Negative for ear pain, nosebleeds, congestion, facial swelling, rhinorrhea, neck pain, neck stiffness and ear discharge.   Eyes: Negative for pain, discharge, redness, itching and visual disturbance.  Respiratory: Negative for cough, choking, chest tightness, shortness of breath, wheezing and stridor.   Cardiovascular: Negative for chest pain, palpitations and leg swelling.  Gastrointestinal: Negative for abdominal distention.  Genitourinary: Negative for dysuria, urgency, frequency, hematuria, flank pain, decreased urine volume, difficulty urinating and dyspareunia.  Musculoskeletal: Negative for back pain, joint swelling, arthralgias and gait problem.  Neurological: Negative for dizziness, tremors, seizures, syncope, facial asymmetry, speech difficulty, weakness, light-headedness, numbness and headaches.  Hematological: Negative for adenopathy. Does not bruise/bleed easily.  Psychiatric/Behavioral: Negative for hallucinations, behavioral problems, confusion, dysphoric mood, decreased concentration and agitation.    Objective:   Filed Vitals:   05/04/13 0943  BP: 182/83  Pulse: 60  Temp: 98.6 F (37 C)  Resp: 16    Physical Exam  Constitutional: Appears well-developed and well-nourished. No distress.  HENT: Normocephalic. External right and left ear normal. Oropharynx is clear and moist.  Eyes: Conjunctivae and EOM are normal. PERRLA, no scleral icterus.  Neck: Normal ROM. Neck supple. No JVD. No tracheal deviation. No thyromegaly.  CVS: RRR, S1/S2 +, no murmurs, no gallops, no carotid bruit.  Pulmonary: Effort and breath sounds normal, no stridor, rhonchi, wheezes, rales.  Abdominal: Soft.  BS +,  no distension, tenderness, rebound or guarding.  Musculoskeletal: Normal range of motion. No edema and no tenderness.  Lymphadenopathy: No lymphadenopathy noted, cervical, inguinal. Neuro: Alert. Normal reflexes, muscle tone coordination. No cranial nerve deficit. Skin: Skin is warm and dry. No rash noted. Not diaphoretic. No erythema. No pallor.  Psychiatric: Normal mood and affect. Behavior, judgment, thought content normal.   Lab Results  Component Value Date   WBC 6.4 04/19/2013   HGB 13.4 04/19/2013   HCT 38.8 04/19/2013   MCV 89.4 04/19/2013   PLT 213 04/19/2013   Lab Results  Component Value Date   CREATININE 0.97 04/19/2013   BUN 16 04/19/2013   NA 137 04/19/2013   K 4.0 04/19/2013   CL 99 04/19/2013   CO2 24 04/19/2013    Lab Results  Component Value Date   HGBA1C 5.7* 09/27/2012   Lipid Panel  Component Value Date/Time   CHOL 239* 04/17/2013 0500   TRIG 243* 04/17/2013 0500   HDL 39* 04/17/2013 0500   CHOLHDL 6.1 04/17/2013 0500   VLDL 49* 04/17/2013 0500   LDLCALC 151* 04/17/2013 0500       Assessment and plan:   Patient Active Problem List   Diagnosis Date Noted  . Essential hypertension 04/20/2013  . Morbid obesity 04/20/2013  . STEMI (ST elevation myocardial infarction) 04/17/2013  . ST elevation myocardial infarction (STEMI) of lateral wall 09/29/2012    Class: Acute  . Acute combined systolic and diastolic heart failure 09/29/2012    Class: Acute  . Hypertensive urgency 09/29/2012    Class: Chronic  . CEREBROVASCULAR DISEASE-RIGHT CEA 11/30/2009  . PERIPHERAL VASCULAR DISEASE 11/30/2009  . HYPERLIPIDEMIA 10/19/2009  . TOBACCO ABUSE 10/19/2009  . GASTROESOPHAGEAL REFLUX DISEASE 10/19/2009  . DIVERTICULAR DISEASE 10/19/2009  . ATHEROSCLEROTIC CARDIOVASCULAR DISEASE 10/18/2009       STEMI (ST elevation myocardial infarction) -s/p PCI w/ overlapping BMS to the SVG Patient compliant with all her discharge medications Repeat 2-D echo showed an EF of  55-60% Results conveyed to the patient Concerned about not being able to see a cardiologist We'll refer her to Dr. Marcy Salvo add low-dose Lasix and potassium because of persistent shortness of breath Also started on dulera for possible underlying COPD   Tobacco abuse The patient has quit smoking  Essential hypertension Patient did not take her medications this morning, she received 0.1 mg of clonidine She is concerned about ACE inhibitor giving her a cough previously but she was restarted on lisinopril in the hospital and so far the cough has not recurred   Establish care Patient is scheduled for a Pap smear because of history of genital warts Mammogram rescheduled Patient does not except flu vaccination Patient is due for her tetanus immunization 2017  Patient followup in 2 months   The patient was given clear instructions to go to ER or return to medical center if symptoms don't improve, worsen or new problems develop. The patient verbalized understanding. The patient was told to call to get any lab results if not heard anything in the next week.

## 2013-05-05 ENCOUNTER — Other Ambulatory Visit: Payer: Self-pay | Admitting: Internal Medicine

## 2013-05-05 DIAGNOSIS — A630 Anogenital (venereal) warts: Secondary | ICD-10-CM

## 2013-05-05 DIAGNOSIS — Z1231 Encounter for screening mammogram for malignant neoplasm of breast: Secondary | ICD-10-CM

## 2013-05-09 ENCOUNTER — Ambulatory Visit: Payer: Self-pay

## 2013-05-11 ENCOUNTER — Encounter: Payer: Self-pay | Admitting: Cardiology

## 2013-05-11 ENCOUNTER — Ambulatory Visit: Payer: Self-pay

## 2013-05-11 ENCOUNTER — Ambulatory Visit: Payer: Self-pay | Attending: Cardiology | Admitting: Cardiology

## 2013-05-11 VITALS — BP 168/95 | HR 74 | Temp 98.4°F | Resp 18 | Ht 68.0 in | Wt 222.0 lb

## 2013-05-11 DIAGNOSIS — I739 Peripheral vascular disease, unspecified: Secondary | ICD-10-CM

## 2013-05-11 DIAGNOSIS — I25810 Atherosclerosis of coronary artery bypass graft(s) without angina pectoris: Secondary | ICD-10-CM

## 2013-05-11 DIAGNOSIS — I10 Essential (primary) hypertension: Secondary | ICD-10-CM

## 2013-05-11 DIAGNOSIS — I2510 Atherosclerotic heart disease of native coronary artery without angina pectoris: Secondary | ICD-10-CM

## 2013-05-11 DIAGNOSIS — E785 Hyperlipidemia, unspecified: Secondary | ICD-10-CM

## 2013-05-11 MED ORDER — CARVEDILOL 6.25 MG PO TABS: 3.1250 mg | ORAL_TABLET | Freq: Two times a day (BID) | ORAL | Status: DC

## 2013-05-11 MED ORDER — HYDROCHLOROTHIAZIDE 12.5 MG PO CAPS: ORAL_CAPSULE | ORAL | Status: DC

## 2013-05-11 MED ORDER — LOSARTAN POTASSIUM 100 MG PO TABS: 100.0000 mg | ORAL_TABLET | Freq: Every day | ORAL | Status: DC

## 2013-05-11 NOTE — Progress Notes (Signed)
Pt here to f/u with Dr. Daleen Squibb for MI with hospitalization C/o fatigue and sob Taking prescribed medication Pt didn't fill Lasix or Potassium due to cost BP-168/95 74 Denies CP

## 2013-05-11 NOTE — Assessment & Plan Note (Signed)
Stable with good secondary preventative therapy. Will have her return for fasting blood work to check her lipids and liver panel as well as metabolic profile. I'll have her return to see me in 5 months. Encourage her strongly to continue not to smoke.

## 2013-05-11 NOTE — Progress Notes (Signed)
HPI [name] is in today to establish with me as her cardiologist. 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 This was opened successfully with overlapping bare metal stents. Door to baloon time was only 28 minutes. A left internal mammary and right internal mammary artery graft were patent. Ultrasound prior to discharge demonstrated an ejection fraction of 55% with inferior [name] hypokinesia. There was no significant mitral valve dysfunction.  She is quit smoking. She has had numerous problems with vascular disease with a right carotid endarterectomy, right femoropopliteal bypass and bilateral iliac stents. She had coronary bypass grafting in 2006.  She's not having any angina or ischemic symptoms. She does complain of a cough which she says she's had before with lisinopril. Her blood pressure is also better control when she was on diuretic specifically HCTZ. She has not filled her Lasix and is not taking potassium. She denies any dependent edema this time. She is walking on a daily basis.  Past Medical History  Diagnosis Date  . ASCVD (arteriosclerotic cardiovascular disease)     CABG in 2006   . PVD (peripheral vascular disease)   . Cerebrovascular disease   . Hypertension   . Tobacco abuse   . GERD (gastroesophageal reflux disease)   . H. pylori infection   . Hepatitis   . Diverticulitis   . DJD (degenerative joint disease) of cervical spine   . Migraine headache   . Coronary artery disease     Current Outpatient Prescriptions  Medication Sig Dispense Refill  . aspirin 81 MG tablet Take 81 mg by mouth daily.        Marland Kitchen atorvastatin (LIPITOR) 80 MG tablet Take 1 tablet (80 mg total) by mouth daily at 6 PM.  30 tablet  12  . carvedilol (COREG) 3.125 MG tablet Take 1 tablet (3.125 mg total) by mouth 2 (two) times daily with a meal.  60 tablet  12  . clopidogrel (PLAVIX) 75 MG tablet Take 1 tablet (75 mg total) by  mouth daily with breakfast.  30 tablet  12  . lisinopril (PRINIVIL,ZESTRIL) 20 MG tablet Take 1 tablet (20 mg total) by mouth daily.  30 tablet  12  . omeprazole (PRILOSEC) 10 MG capsule Take 10 mg by mouth daily.      . furosemide (LASIX) 40 MG tablet Take 1 tablet (40 mg total) by mouth daily.  30 tablet  3  . mometasone-formoterol (DULERA) 100-5 MCG/ACT AERO Inhale 2 puffs into the lungs 2 (two) times daily.  1 Inhaler  3  . nitroGLYCERIN (NITROSTAT) 0.4 MG SL tablet Place 1 tablet (0.4 mg total) under the tongue every 5 (five) minutes x 3 doses as needed for chest pain.  25 tablet  3  . pantoprazole (PROTONIX) 20 MG tablet Take 1 tablet (20 mg total) by mouth daily.  30 tablet  12  . potassium chloride (K-DUR) 10 MEQ tablet Take 1 tablet (10 mEq total) by mouth daily.  30 tablet  2   No current facility-administered medications for this visit.    Allergies  Allergen Reactions  . Codeine Itching    welts  . Morphine Itching    Welts     Family History  Problem Relation Age of Onset  . Pneumonia Mother   . Coronary artery disease Father   . Heart disease Sister   . Colon cancer Maternal Aunt     Great aunt, rectal  .  Diabetes Maternal Uncle   . Heart disease Maternal Uncle   . Breast cancer Maternal Grandmother   . Heart disease Other     Father's side of family    History   Social History  . Marital Status: Single    Spouse Name: N/A    Number of Children: N/A  . 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 Smoking status: Former Smoker -- 0.50 packs/day  . Smokeless tobacco: Former NeurosurgeonUser    Quit date: 04/17/2013     Comment: 1/2 pack per day since 2005, 40 pack years  . Alcohol Use: No  . Drug Use: No  . Sexual Activity: Yes   Other Topics Concern  . Not on file   Social History Narrative   Divorced, no children    ROS ALL NEGATIVE EXCEPT THOSE NOTED IN HPI  PE  General  Appearance: well developed, well nourished in no acute distress, obese HEENT: symmetrical face, PERRLA, good dentition  Neck: no JVD, thyromegaly, or adenopathy, trachea midline Chest: symmetric without deformity Cardiac: PMI non-displaced, RRR, normal S1, S2, no gallop or murmur Lung: clear to ausculation and percussion Vascular: Bilateral carotid bruits Abdominal: nondistended, nontender, good bowel sounds Extremities: no cyanosis, clubbing or edema, no sign of DVT, no varicosities  Skin: normal color, no rashes Neuro: alert and oriented x 3, non-focal Pysch: normal affect  EKG  BMET    Component Value Date/Time   NA 137 04/19/2013 1112   K 4.0 04/19/2013 1112   CL 99 04/19/2013 1112   CO2 24 04/19/2013 1112   GLUCOSE 89 04/19/2013 1112   BUN 16 04/19/2013 1112   CREATININE 0.97 04/19/2013 1112   CALCIUM 9.0 04/19/2013 1112   GFRNONAA 61* 04/19/2013 1112   GFRAA 71* 04/19/2013 1112    Lipid Panel     Component Value Date/Time   CHOL 239* 04/17/2013 0500   TRIG 243* 04/17/2013 0500   HDL 39* 04/17/2013 0500   CHOLHDL 6.1 04/17/2013 0500   VLDL 49* 04/17/2013 0500   LDLCALC 151* 04/17/2013 0500    CBC    Component Value Date/Time   WBC 6.4 04/19/2013 1112   RBC 4.34 04/19/2013 1112   HGB 13.4 04/19/2013 1112   HCT 38.8 04/19/2013 1112   PLT 213 04/19/2013 1112   MCV 89.4 04/19/2013 1112   MCH 30.9 04/19/2013 1112   MCHC 34.5 04/19/2013 1112   RDW 14.0 04/19/2013 1112   LYMPHSABS 1.4 04/17/2013 0300   MONOABS 0.7 04/17/2013 0300   EOSABS 0.0 04/17/2013 0300   BASOSABS 0.0 04/17/2013 0300

## 2013-05-25 ENCOUNTER — Ambulatory Visit: Payer: Self-pay | Attending: Internal Medicine

## 2013-05-25 DIAGNOSIS — I213 ST elevation (STEMI) myocardial infarction of unspecified site: Secondary | ICD-10-CM

## 2013-05-25 DIAGNOSIS — A630 Anogenital (venereal) warts: Secondary | ICD-10-CM

## 2013-05-25 DIAGNOSIS — I10 Essential (primary) hypertension: Secondary | ICD-10-CM

## 2013-05-25 LAB — COMPLETE METABOLIC PANEL WITH GFR
ALT: 19 U/L (ref 0–35)
AST: 18 U/L (ref 0–37)
Albumin: 4.4 g/dL (ref 3.5–5.2)
Alkaline Phosphatase: 83 U/L (ref 39–117)
BILIRUBIN TOTAL: 0.5 mg/dL (ref 0.2–1.2)
BUN: 30 mg/dL — ABNORMAL HIGH (ref 6–23)
CHLORIDE: 102 meq/L (ref 96–112)
CO2: 27 mEq/L (ref 19–32)
Calcium: 9.4 mg/dL (ref 8.4–10.5)
Creat: 1.21 mg/dL — ABNORMAL HIGH (ref 0.50–1.10)
GFR, EST AFRICAN AMERICAN: 55 mL/min — AB
GFR, Est Non African American: 48 mL/min — ABNORMAL LOW
GLUCOSE: 91 mg/dL (ref 70–99)
Potassium: 4.9 mEq/L (ref 3.5–5.3)
Sodium: 137 mEq/L (ref 135–145)
TOTAL PROTEIN: 7.1 g/dL (ref 6.0–8.3)

## 2013-05-26 ENCOUNTER — Telehealth: Payer: Self-pay | Admitting: Cardiology

## 2013-05-26 NOTE — Telephone Encounter (Signed)
Orders faxed to [facility]hab are illegible please refax f: [phone].   °

## 2013-05-27 ENCOUNTER — Other Ambulatory Visit: Payer: Self-pay | Admitting: Internal Medicine

## 2013-05-27 MED ORDER — MOMETASONE FURO-FORMOTEROL FUM 100-5 MCG/ACT IN AERO: 2.0000 | INHALATION_SPRAY | Freq: Two times a day (BID) | RESPIRATORY_TRACT | Status: DC

## 2013-07-11 ENCOUNTER — Ambulatory Visit: Payer: Self-pay | Admitting: Internal Medicine

## 2013-10-31 ENCOUNTER — Encounter: Payer: Self-pay | Admitting: Gastroenterology

## 2014-01-12 ENCOUNTER — Encounter (HOSPITAL_COMMUNITY): Payer: Self-pay | Admitting: Cardiovascular Disease

## 2014-03-03 ENCOUNTER — Other Ambulatory Visit: Payer: Self-pay | Admitting: General Practice

## 2014-03-03 NOTE — Telephone Encounter (Signed)
Patient presents to clinic requesting medication refill for all of her medications. These medications were prescribed by Dr. Daleen Squibb. Patient was last in for OV with Dr Daleen Squibb in April. Scheduled patient a follow up visit, 03/08/14.  Informed patient that upon checkout on 03/08/14, we can schedule her a PCP follow up, if needed. Patient is currently out of this medication. Please assist with refill request.

## 2014-03-08 ENCOUNTER — Ambulatory Visit: Payer: Self-pay | Attending: Cardiology | Admitting: Cardiology

## 2014-03-08 ENCOUNTER — Other Ambulatory Visit (HOSPITAL_COMMUNITY): Payer: Self-pay | Admitting: Cardiology

## 2014-03-08 ENCOUNTER — Encounter: Payer: Self-pay | Admitting: Cardiology

## 2014-03-08 VITALS — BP 169/82 | HR 76 | Temp 98.7°F | Resp 20 | Ht 68.0 in | Wt 227.0 lb

## 2014-03-08 DIAGNOSIS — I252 Old myocardial infarction: Secondary | ICD-10-CM | POA: Insufficient documentation

## 2014-03-08 DIAGNOSIS — Z87891 Personal history of nicotine dependence: Secondary | ICD-10-CM | POA: Insufficient documentation

## 2014-03-08 DIAGNOSIS — I739 Peripheral vascular disease, unspecified: Secondary | ICD-10-CM | POA: Insufficient documentation

## 2014-03-08 DIAGNOSIS — I25810 Atherosclerosis of coronary artery bypass graft(s) without angina pectoris: Secondary | ICD-10-CM

## 2014-03-08 DIAGNOSIS — Z79899 Other long term (current) drug therapy: Secondary | ICD-10-CM | POA: Insufficient documentation

## 2014-03-08 DIAGNOSIS — Z7902 Long term (current) use of antithrombotics/antiplatelets: Secondary | ICD-10-CM | POA: Insufficient documentation

## 2014-03-08 DIAGNOSIS — E785 Hyperlipidemia, unspecified: Secondary | ICD-10-CM | POA: Insufficient documentation

## 2014-03-08 DIAGNOSIS — I679 Cerebrovascular disease, unspecified: Secondary | ICD-10-CM | POA: Insufficient documentation

## 2014-03-08 DIAGNOSIS — K219 Gastro-esophageal reflux disease without esophagitis: Secondary | ICD-10-CM | POA: Insufficient documentation

## 2014-03-08 DIAGNOSIS — I2510 Atherosclerotic heart disease of native coronary artery without angina pectoris: Secondary | ICD-10-CM

## 2014-03-08 DIAGNOSIS — I10 Essential (primary) hypertension: Secondary | ICD-10-CM | POA: Insufficient documentation

## 2014-03-08 DIAGNOSIS — Z7982 Long term (current) use of aspirin: Secondary | ICD-10-CM | POA: Insufficient documentation

## 2014-03-08 DIAGNOSIS — Z9889 Other specified postprocedural states: Secondary | ICD-10-CM

## 2014-03-08 LAB — COMPREHENSIVE METABOLIC PANEL
ALT: 15 U/L (ref 0–35)
AST: 14 U/L (ref 0–37)
Albumin: 3.9 g/dL (ref 3.5–5.2)
Alkaline Phosphatase: 96 U/L (ref 39–117)
BUN: 20 mg/dL (ref 6–23)
CHLORIDE: 102 meq/L (ref 96–112)
CO2: 29 meq/L (ref 19–32)
Calcium: 9.7 mg/dL (ref 8.4–10.5)
Creat: 1.08 mg/dL (ref 0.50–1.10)
GLUCOSE: 91 mg/dL (ref 70–99)
Potassium: 5.2 mEq/L (ref 3.5–5.3)
Sodium: 140 mEq/L (ref 135–145)
Total Bilirubin: 0.5 mg/dL (ref 0.2–1.2)
Total Protein: 6.7 g/dL (ref 6.0–8.3)

## 2014-03-08 MED ORDER — ATORVASTATIN CALCIUM 80 MG PO TABS: 80.0000 mg | ORAL_TABLET | Freq: Every day | ORAL | Status: DC

## 2014-03-08 MED ORDER — HYDROCHLOROTHIAZIDE 12.5 MG PO CAPS: ORAL_CAPSULE | ORAL | Status: DC

## 2014-03-08 MED ORDER — LOSARTAN POTASSIUM 50 MG PO TABS: 50.0000 mg | ORAL_TABLET | Freq: Every day | ORAL | Status: DC

## 2014-03-08 MED ORDER — CARVEDILOL 6.25 MG PO TABS: 3.1250 mg | ORAL_TABLET | Freq: Two times a day (BID) | ORAL | Status: DC

## 2014-03-08 MED ORDER — CLOPIDOGREL BISULFATE 75 MG PO TABS: 75.0000 mg | ORAL_TABLET | Freq: Every day | ORAL | Status: DC

## 2014-03-08 MED ORDER — NITROGLYCERIN 0.4 MG SL SUBL
0.4000 mg | SUBLINGUAL_TABLET | SUBLINGUAL | Status: DC | PRN
Start: 2014-03-08 — End: 2015-06-07

## 2014-03-08 NOTE — Progress Notes (Signed)
Patient here for follow-up. Hx of MI in 8/14 and 3/15 Patient complains of fatigue and occasional chest pain when she gets upset. No chest pain at this time. Patient occasionally experiences shortness of breath with exertion. Patient indicates she needs refills of her medications and has not been taking lasix or potassium supplement.  She only takes losartan occasionally as it causes her to feel tired. Patient refuses flu shot.

## 2014-03-08 NOTE — Assessment & Plan Note (Signed)
She is asymptomatic status post right carotid endarterectomy in the remote past. Will arrange for carotid ultrasound. Continue secondary preventative therapy as noted above.

## 2014-03-08 NOTE — Assessment & Plan Note (Signed)
Stable with no claudication. Patient advised of symptoms and need to consult [place].

## 2014-03-08 NOTE — Progress Notes (Signed)
HPI [name] returns today for evaluation and management of her history of myocardial infarction, normal left ventricular systolic function, cerebrovascular disease status post right carotid endarterectomy, peripheral vascular disease status post right femoropopliteal bypass, hypertension, hyperlipidemia, and obesity.  She denies any angina. She denies orthopnea, PND or edema. She is not taking her Lasix. She also does not take her losartan because 100 mg makes her feel tired. Her blood pressure is out of control today. She feels better when she takes 50 mg. She is due blood work.  She also denies any symptoms of TIAs or claudication. She continues not to smoke.  Past Medical History  Diagnosis Date  . ASCVD (arteriosclerotic cardiovascular disease)     CABG in 2006   . PVD (peripheral vascular disease)   . Cerebrovascular disease   . Hypertension   . Tobacco abuse   . GERD (gastroesophageal reflux disease)   . H. pylori infection   . Hepatitis   . Diverticulitis   . DJD (degenerative joint disease) of cervical spine   . Migraine headache   . Coronary artery disease     Current Outpatient Prescriptions  Medication Sig Dispense Refill  . aspirin 81 MG tablet Take 81 mg by mouth daily.      Marland Kitchen atorvastatin (LIPITOR) 80 MG tablet Take 1 tablet (80 mg total) by mouth daily at 6 PM. 30 tablet 12  . carvedilol (COREG) 6.25 MG tablet Take 0.5 tablets (3.125 mg total) by mouth 2 (two) times daily with a meal. 60 tablet 12  . clopidogrel (PLAVIX) 75 MG tablet Take 1 tablet (75 mg total) by mouth daily with breakfast. 30 tablet 12  . hydrochlorothiazide (MICROZIDE) 12.5 MG capsule Take in the morning 90 capsule 2  . losartan (COZAAR) 100 MG tablet Take 1 tablet (100 mg total) by mouth daily. 90 tablet 3  . mometasone-formoterol (DULERA) 100-5 MCG/ACT AERO Inhale 2 puffs into the lungs 2 (two) times daily. 3 Inhaler 3  . nitroGLYCERIN (NITROSTAT) 0.4 MG SL tablet Place 1 tablet (0.4 mg total)  under the tongue every 5 (five) minutes x 3 doses as needed for chest pain. 25 tablet 3  . omeprazole (PRILOSEC) 10 MG capsule Take 10 mg by mouth daily.    . furosemide (LASIX) 40 MG tablet Take 1 tablet (40 mg total) by mouth daily. (Patient not taking: Reported on 03/08/2014) 30 tablet 3  . potassium chloride (K-DUR) 10 MEQ tablet Take 1 tablet (10 mEq total) by mouth daily. (Patient not taking: Reported on 03/08/2014) 30 tablet 2   No current facility-administered medications for this visit.    Allergies  Allergen Reactions  . Ace Inhibitors Cough  . Codeine Itching    welts  . Morphine Itching    Welts     Family History  Problem Relation Age of Onset  . Pneumonia Mother   . Coronary artery disease Father   . Heart disease Sister   . Colon cancer Maternal Aunt     Great aunt, rectal  . Diabetes Maternal Uncle   . Heart disease Maternal Uncle   . Breast cancer Maternal Grandmother   . Heart disease Other     Father's side of family    History   Social History  . Marital Status: Single    Spouse Name: N/A    Number of Children: N/A  . 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  Smoking status: Former Smoker -- 0.50 packs/day  . Smokeless tobacco: Former Neurosurgeon    Quit date: 04/17/2013     Comment: 1/2 pack per day since 2005, 40 pack years  . Alcohol Use: No  . Drug Use: No  . Sexual Activity: Yes   Other Topics Concern  . Not on file   Social History Narrative   Divorced, no children    ROS ALL NEGATIVE EXCEPT THOSE NOTED IN HPI  PE  General Appearance: well developed, well nourished in no acute distress, very pleasant, obese HEENT: symmetrical face, PERRLA, good dentition  Neck: no JVD, thyromegaly, or adenopathy, trachea midline Chest: symmetric without deformity Cardiac: PMI non-displaced, RRR, normal S1, S2, no gallop or murmur Lung: clear to ausculation and  percussion Vascular: Right carotid bruit, peripheral pulses 1+ over 4+ with good capillary refill Abdominal: nondistended, nontender, good bowel sounds,  Extremities: no cyanosis, clubbing or edema, no sign of DVT, no varicosities  Skin: normal color, no rashes Neuro: alert and oriented x 3, non-focal Pysch: normal affect  EKG  BMET    Component Value Date/Time   NA 137 05/25/2013 1102   K 4.9 05/25/2013 1102   CL 102 05/25/2013 1102   CO2 27 05/25/2013 1102   GLUCOSE 91 05/25/2013 1102   BUN 30* 05/25/2013 1102   CREATININE 1.21* 05/25/2013 1102   CREATININE 0.97 04/19/2013 1112   CALCIUM 9.4 05/25/2013 1102   GFRNONAA 48* 05/25/2013 1102   GFRNONAA 61* 04/19/2013 1112   GFRAA 55* 05/25/2013 1102   GFRAA 71* 04/19/2013 1112    Lipid Panel     Component Value Date/Time   CHOL 239* 04/17/2013 0500   TRIG 243* 04/17/2013 0500   HDL 39* 04/17/2013 0500   CHOLHDL 6.1 04/17/2013 0500   VLDL 49* 04/17/2013 0500   LDLCALC 151* 04/17/2013 0500    CBC    Component Value Date/Time   WBC 6.4 04/19/2013 1112   RBC 4.34 04/19/2013 1112   HGB 13.4 04/19/2013 1112   HCT 38.8 04/19/2013 1112   PLT 213 04/19/2013 1112   MCV 89.4 04/19/2013 1112   MCH 30.9 04/19/2013 1112   MCHC 34.5 04/19/2013 1112   RDW 14.0 04/19/2013 1112   LYMPHSABS 1.4 04/17/2013 0300   MONOABS 0.7 04/17/2013 0300   EOSABS 0.0 04/17/2013 0300   BASOSABS 0.0 04/17/2013 0300

## 2014-03-08 NOTE — Patient Instructions (Signed)
Your medications have been refilled Begin taking Losartan 50 mg daily Dr. Daleen Squibb would like you to have a carotid ultrasound. This has been scheduled. Please return next week for fasting labwork.

## 2014-03-08 NOTE — Assessment & Plan Note (Signed)
Patient will return for fasting lipids and comprehensive metabolic profile. Continue atorvastatin and adjust for LDL goal of less than 100.

## 2014-03-08 NOTE — Assessment & Plan Note (Signed)
She is stable with no angina. Secondary prevention is lacking with poorly controlled hypertension. Her last LDL was not at goal. We'll continue current therapy except decrease losartan to 50 mg a day so that she will take it. We will arrange a fasting lipid panel and a comprehensive metabolic profile. Medications renewed. Continue carvedilol, aspirin, Plavix, losartan, and atorvastatin. We'll see back in one year.

## 2014-03-08 NOTE — Assessment & Plan Note (Signed)
Encouraged to lose weight

## 2014-03-08 NOTE — Assessment & Plan Note (Signed)
Not well-controlled as noted. Patient advised to continue losartan but it 50 mg a day for less side effects.

## 2014-03-09 ENCOUNTER — Ambulatory Visit (HOSPITAL_COMMUNITY): Payer: Self-pay | Attending: Cardiology

## 2014-03-13 ENCOUNTER — Other Ambulatory Visit: Payer: Self-pay

## 2014-03-28 ENCOUNTER — Encounter: Payer: Self-pay | Admitting: Gastroenterology

## 2015-02-06 MED FILL — CARVEDILOL 6.25 MG TABLET: 6.25 | 90 days supply | Qty: 90 | Fill #3

## 2015-02-06 MED FILL — ATORVASTATIN 80 MG TABLET: 80 | 90 days supply | Qty: 90 | Fill #3

## 2015-02-06 MED FILL — CLOPIDOGREL 75 MG TABLET: 75 | 90 days supply | Qty: 90 | Fill #3

## 2015-02-27 ENCOUNTER — Telehealth: Payer: Self-pay

## 2015-02-27 DIAGNOSIS — I10 Essential (primary) hypertension: Secondary | ICD-10-CM

## 2015-02-27 MED ORDER — HYDROCHLOROTHIAZIDE 12.5 MG PO CAPS: ORAL_CAPSULE | ORAL | Status: DC

## 2015-02-27 MED FILL — HYDROCHLOROTHIAZIDE 12.5 MG: 12.5 | 30 days supply | Qty: 30 | Fill #0

## 2015-02-27 NOTE — Telephone Encounter (Signed)
Patient called nurse, patient verified date of birth. Patient needs hydrochlorothiazide refilled. Patient is out of medication. Nurse reviewed chart. Patient aware of needing appointment. Nurse refilled hydrochlorothiazide for 1 month.  Nurse will call patient back after speaking to office manager to schedule appointment with appropriate provider.  Patient voices understanding and has no further questions at this time.

## 2015-04-10 ENCOUNTER — Telehealth: Payer: Self-pay | Admitting: Internal Medicine

## 2015-04-10 NOTE — Telephone Encounter (Signed)
Patient called requesting a medication refill for hydrochlorothiazide. Please follow up.

## 2015-04-11 ENCOUNTER — Other Ambulatory Visit: Payer: Self-pay | Admitting: *Deleted

## 2015-04-11 DIAGNOSIS — I10 Essential (primary) hypertension: Secondary | ICD-10-CM

## 2015-04-11 MED ORDER — HYDROCHLOROTHIAZIDE 12.5 MG PO CAPS: ORAL_CAPSULE | ORAL | Status: DC

## 2015-04-11 NOTE — Telephone Encounter (Signed)
Patient has not been seen in a year. Patient has an appointment on 04/18/15 Patient received a refill for one month to accomodate until appointment with Dr. Cato Mulligan.

## 2015-04-18 ENCOUNTER — Ambulatory Visit: Payer: Self-pay | Admitting: Internal Medicine

## 2015-04-27 MED FILL — ?HYDROCHLOROTHIAZIDE 12.5MG: 12.5 | 30 days supply | Qty: 30 | Fill #0

## 2015-05-24 ENCOUNTER — Other Ambulatory Visit: Payer: Self-pay | Admitting: Cardiology

## 2015-05-24 NOTE — Telephone Encounter (Signed)
clopidogrel (PLAVIX) 75 MG tablet  Medication   Date: 03/08/2014  Department: [facility]  Ordering/Authorizing: Gaylord Shih, MD      Order Providers    Prescribing Provider Encounter Provider   Gaylord Shih, MD Gaylord Shih, MD    Medication Detail      Disp Refills Start End     clopidogrel (PLAVIX) 75 MG tablet 30 tablet 12 03/08/2014     Sig - Route: Take 1 tablet (75 mg total) by mouth daily with breakfast. - Oral    E-Prescribing Status: Receipt confirmed by pharmacy (03/08/2014 9:44 AM EST)     Pharmacy    [facility] - [place], [place] - [address]

## 2015-05-28 NOTE — Telephone Encounter (Signed)
Patient has not been seen since 03/2014. Patient was scheduled to see Dr. Cato Mulligan on 03/23/15 and cancelled.  Patient rescheduled for FU with Dr. Cato Mulligan.

## 2015-05-28 NOTE — Telephone Encounter (Signed)
Patient called and requested a med refill for all of the current medications prescribed by Dr. Daleen Squibb, patient ran out of medication a week ago. Please f/u

## 2015-05-30 ENCOUNTER — Telehealth: Payer: Self-pay | Admitting: Internal Medicine

## 2015-05-30 NOTE — Telephone Encounter (Signed)
Patient needs a refill for active medications. Please follow up.

## 2015-06-01 NOTE — Telephone Encounter (Signed)
Patient has an appointment on 06/07/15 with Dr. Armen Pickup to reestablish care. Medication refill will be addressed at that time.

## 2015-06-07 ENCOUNTER — Ambulatory Visit: Payer: Self-pay | Attending: Family Medicine | Admitting: Family Medicine

## 2015-06-07 ENCOUNTER — Encounter: Payer: Self-pay | Admitting: Family Medicine

## 2015-06-07 VITALS — BP 173/104 | HR 66 | Temp 98.5°F | Resp 16 | Ht 68.5 in | Wt 216.0 lb

## 2015-06-07 DIAGNOSIS — Z87891 Personal history of nicotine dependence: Secondary | ICD-10-CM | POA: Insufficient documentation

## 2015-06-07 DIAGNOSIS — E875 Hyperkalemia: Secondary | ICD-10-CM

## 2015-06-07 DIAGNOSIS — I252 Old myocardial infarction: Secondary | ICD-10-CM

## 2015-06-07 DIAGNOSIS — Z1231 Encounter for screening mammogram for malignant neoplasm of breast: Secondary | ICD-10-CM

## 2015-06-07 DIAGNOSIS — N183 Chronic kidney disease, stage 3 unspecified: Secondary | ICD-10-CM

## 2015-06-07 DIAGNOSIS — I5032 Chronic diastolic (congestive) heart failure: Secondary | ICD-10-CM

## 2015-06-07 DIAGNOSIS — E785 Hyperlipidemia, unspecified: Secondary | ICD-10-CM

## 2015-06-07 DIAGNOSIS — K219 Gastro-esophageal reflux disease without esophagitis: Secondary | ICD-10-CM

## 2015-06-07 DIAGNOSIS — I25810 Atherosclerosis of coronary artery bypass graft(s) without angina pectoris: Secondary | ICD-10-CM

## 2015-06-07 DIAGNOSIS — Z1159 Encounter for screening for other viral diseases: Secondary | ICD-10-CM

## 2015-06-07 DIAGNOSIS — E669 Obesity, unspecified: Secondary | ICD-10-CM

## 2015-06-07 DIAGNOSIS — E66811 Obesity, class 1: Secondary | ICD-10-CM

## 2015-06-07 DIAGNOSIS — Z114 Encounter for screening for human immunodeficiency virus [HIV]: Secondary | ICD-10-CM

## 2015-06-07 DIAGNOSIS — Z79899 Other long term (current) drug therapy: Secondary | ICD-10-CM | POA: Insufficient documentation

## 2015-06-07 DIAGNOSIS — I10 Essential (primary) hypertension: Secondary | ICD-10-CM

## 2015-06-07 DIAGNOSIS — Z7982 Long term (current) use of aspirin: Secondary | ICD-10-CM | POA: Insufficient documentation

## 2015-06-07 DIAGNOSIS — J449 Chronic obstructive pulmonary disease, unspecified: Secondary | ICD-10-CM

## 2015-06-07 LAB — COMPLETE METABOLIC PANEL WITH GFR
ALT: 13 U/L (ref 6–29)
AST: 13 U/L (ref 10–35)
Albumin: 4.2 g/dL (ref 3.6–5.1)
Alkaline Phosphatase: 90 U/L (ref 33–130)
BUN: 19 mg/dL (ref 7–25)
CHLORIDE: 102 mmol/L (ref 98–110)
CO2: 27 mmol/L (ref 20–31)
Calcium: 9.8 mg/dL (ref 8.6–10.4)
Creat: 1.22 mg/dL — ABNORMAL HIGH (ref 0.50–0.99)
GFR, EST NON AFRICAN AMERICAN: 47 mL/min — AB (ref >=60)
GFR, Est African American: 54 mL/min — ABNORMAL LOW (ref >=60)
GLUCOSE: 87 mg/dL (ref 65–99)
POTASSIUM: 5.8 mmol/L — AB (ref 3.5–5.3)
Sodium: 139 mmol/L (ref 135–146)
Total Bilirubin: 0.4 mg/dL (ref 0.2–1.2)
Total Protein: 6.7 g/dL (ref 6.1–8.1)

## 2015-06-07 LAB — LIPID PANEL
Cholesterol: 255 mg/dL — ABNORMAL HIGH (ref 125–200)
HDL: 44 mg/dL — ABNORMAL LOW (ref >=46)
LDL Cholesterol: 145 mg/dL — ABNORMAL HIGH (ref <130)
Total CHOL/HDL Ratio: 5.8 Ratio — ABNORMAL HIGH (ref <=5.0)
Triglycerides: 332 mg/dL — ABNORMAL HIGH (ref <150)
VLDL: 66 mg/dL — ABNORMAL HIGH (ref <30)

## 2015-06-07 MED ORDER — CLOPIDOGREL BISULFATE 75 MG PO TABS: 75.0000 mg | ORAL_TABLET | Freq: Every day | ORAL | Status: DC

## 2015-06-07 MED ORDER — NITROGLYCERIN 0.4 MG SL SUBL: 0.4000 mg | SUBLINGUAL_TABLET | SUBLINGUAL | Status: DC | PRN

## 2015-06-07 MED ORDER — ASPIRIN 81 MG PO TABS: 81.0000 mg | ORAL_TABLET | Freq: Every day | ORAL | Status: DC

## 2015-06-07 MED ORDER — RANITIDINE HCL 150 MG PO TABS: 150.0000 mg | ORAL_TABLET | Freq: Two times a day (BID) | ORAL | Status: DC

## 2015-06-07 MED ORDER — ATORVASTATIN CALCIUM 80 MG PO TABS: 80.0000 mg | ORAL_TABLET | Freq: Every day | ORAL | Status: DC

## 2015-06-07 MED ORDER — CARVEDILOL 6.25 MG PO TABS: 3.1250 mg | ORAL_TABLET | Freq: Two times a day (BID) | ORAL | Status: DC

## 2015-06-07 MED ORDER — LOSARTAN POTASSIUM 50 MG PO TABS: 25.0000 mg | ORAL_TABLET | Freq: Two times a day (BID) | ORAL | Status: DC

## 2015-06-07 MED ORDER — OMEPRAZOLE 10 MG PO CPDR: 10.0000 mg | DELAYED_RELEASE_CAPSULE | Freq: Every day | ORAL | Status: DC

## 2015-06-07 MED ORDER — HYDROCHLOROTHIAZIDE 25 MG PO TABS: 25.0000 mg | ORAL_TABLET | Freq: Every day | ORAL | Status: DC

## 2015-06-07 MED ORDER — MOMETASONE FURO-FORMOTEROL FUM 100-5 MCG/ACT IN AERO: 2.0000 | INHALATION_SPRAY | Freq: Two times a day (BID) | RESPIRATORY_TRACT | Status: DC

## 2015-06-07 MED FILL — LOSARTAN POTASSIUM 50 MG TA: 50 | 30 days supply | Qty: 30 | Fill #0

## 2015-06-07 MED FILL — ATORVASTATIN 80 MG TABLET: 80 | 30 days supply | Qty: 30 | Fill #0

## 2015-06-07 MED FILL — raNITIdine HCL 150 MG TABS: 150 | 30 days supply | Qty: 60 | Fill #0

## 2015-06-07 MED FILL — **DULERA 100 MCG/5 MCG INHA: 100-5 MCG | 30 days supply | Qty: 13 | Fill #0

## 2015-06-07 MED FILL — ?HYDROCHLOROTHIAZIDE 25 MG: 25 MG | 30 days supply | Qty: 30 | Fill #0

## 2015-06-07 MED FILL — CARVEDILOL 6.25 MG TABLET: 6.25 | 30 days supply | Qty: 30 | Fill #0

## 2015-06-07 MED FILL — NITROSTAT 0.4 MG TABLET SL: 0.4 | 25 days supply | Qty: 25 | Fill #0

## 2015-06-07 MED FILL — CLOPIDOGREL 75 MG TABLET: 75 | 30 days supply | Qty: 30 | Fill #0

## 2015-06-07 NOTE — Patient Instructions (Addendum)
[name] was seen today for hypertension.  Diagnoses and all orders for this visit:  Coronary atherosclerosis of autologous vein bypass graft without angina -     Ambulatory referral to Cardiology -     aspirin 81 MG tablet; Take 1 tablet (81 mg total) by mouth daily. Reported on 06/07/2015 -     atorvastatin (LIPITOR) 80 MG tablet; Take 1 tablet (80 mg total) by mouth daily at 6 PM. -     carvedilol (COREG) 6.25 MG tablet; Take 0.5 tablets (3.125 mg total) by mouth 2 (two) times daily with a meal. -     clopidogrel (PLAVIX) 75 MG tablet; Take 1 tablet (75 mg total) by mouth daily with breakfast. -     nitroGLYCERIN (NITROSTAT) 0.4 MG SL tablet; Place 1 tablet (0.4 mg total) under the tongue every 5 (five) minutes x 3 doses as needed for chest pain. -     Lipid Panel  History of inferior wall myocardial infarction -     Ambulatory referral to Cardiology  Hyperlipidemia -     atorvastatin (LIPITOR) 80 MG tablet; Take 1 tablet (80 mg total) by mouth daily at 6 PM.  Essential hypertension -     carvedilol (COREG) 6.25 MG tablet; Take 0.5 tablets (3.125 mg total) by mouth 2 (two) times daily with a meal. -     losartan (COZAAR) 50 MG tablet; Take 0.5 tablets (25 mg total) by mouth 2 (two) times daily. -     COMPLETE METABOLIC PANEL WITH GFR -     hydrochlorothiazide (HYDRODIURIL) 25 MG tablet; Take 1 tablet (25 mg total) by mouth daily.  Chronic diastolic congestive heart failure (HCC) -     carvedilol (COREG) 6.25 MG tablet; Take 0.5 tablets (3.125 mg total) by mouth 2 (two) times daily with a meal.  Gastroesophageal reflux disease, esophagitis presence not specified -     Discontinue: omeprazole (PRILOSEC) 10 MG capsule; Take 1 capsule (10 mg total) by mouth daily. -     ranitidine (ZANTAC) 150 MG tablet; Take 1 tablet (150 mg total) by mouth 2 (two) times daily.  Chronic obstructive pulmonary disease, unspecified COPD type (HCC) -     mometasone-formoterol (DULERA) 100-5 MCG/ACT AERO;  Inhale 2 puffs into the lungs 2 (two) times daily.  Screening for HIV (human immunodeficiency virus) -     HIV antibody (with reflex)  Need for hepatitis C screening test -     Hepatitis C antibody, reflex  Visit for screening mammogram -     MM DIGITAL SCREENING BILATERAL; Future   Please apply for [facility] discount and orange card, you can also inquire if any of your medications are on the PASS (medications assistance) list.   You are due for screening mammogram there is patient assistance/scholarship for mammograms.  See application.   F/u in 4-6 weeks for pap smear  Dr. Armen Pickup

## 2015-06-07 NOTE — Assessment & Plan Note (Signed)
COPD She has quit smoking 2 years ago Refilled dulera

## 2015-06-07 NOTE — Assessment & Plan Note (Signed)
CAD  Refilled statin, plavix, ASA NTG for prn use  Check A1c, lipids Cards referral

## 2015-06-07 NOTE — Progress Notes (Signed)
F/U HTN, Cardiology referral  Not taking medication x 3 weeks  No pain today  No tobacco user  No suicidal thought in the past two weeks

## 2015-06-07 NOTE — Assessment & Plan Note (Signed)
GERD prilosec interacts with plavix  Changed to zantac

## 2015-06-07 NOTE — Progress Notes (Signed)
Subjective:  Patient ID: [name], female    DOB: Jan 12, 1951  Age: 65 y.o. MRN: [id number]  CC: Hypertension   HPI [name]  Had hx of carotid artery disease, s.p stroke, CAD, s/p MI,  Diastolic CHF< COPD, former smoker, PAD s/p R fem-pop bypass, GERD she presents for   1. CHRONIC HYPERTENSION  Disease Monitoring  Blood pressure range: not checking   Chest pain: no   Dyspnea: yes   Claudication: no   Medication compliance: no, none x 3 weeks . Was tolerant of regimen except felt poorly on 50 mg losartan, so she took 25 mg BID. Higher doses of coreg caused dizziness. She was not taking lasix.  Medication Side Effects  Lightheadedness: no   Urinary frequency: no   Edema: yes     Preventitive Healthcare:  Exercise: yes, minimal due to dyspnea on exertion      2. Diastolic CHF: has SOB with exertion and leg swelling. Not taking lasix. Reports taking HCTZ 12.5 mg when she had it.   3. GERD: persistent. She takes prilosec OTC which controls her symptoms. No nausea, emesis or chest pain.    4. COPD: former smoker. Has hx of bronchitis as a child. No hx of asthma. Gets coughs and congestion at times. No CP. Has chronic SOB with exertion, comfortable at rest. Is exposed to second hand smoke.   Social History  Substance Use Topics  . Smoking status: Former Smoker -- 0.50 packs/day  . Smokeless tobacco: Former Neurosurgeon    Quit date: 04/17/2013     Comment: 1/2 pack per day since 2005, 40 pack years  . Alcohol Use: No   Outpatient Prescriptions Prior to Visit  Medication Sig Dispense Refill  . aspirin 81 MG tablet Take 81 mg by mouth daily.      Marland Kitchen atorvastatin (LIPITOR) 80 MG tablet Take 1 tablet (80 mg total) by mouth daily at 6 PM. 30 tablet 12  . carvedilol (COREG) 6.25 MG tablet Take 0.5 tablets (3.125 mg total) by mouth 2 (two) times daily with a meal. 60 tablet 12  . clopidogrel (PLAVIX) 75 MG tablet Take 1 tablet (75 mg total) by mouth daily with breakfast. 30 tablet  12  . furosemide (LASIX) 40 MG tablet Take 1 tablet (40 mg total) by mouth daily. (Patient not taking: Reported on 03/08/2014) 30 tablet 3  . hydrochlorothiazide (MICROZIDE) 12.5 MG capsule Take in the morning 30 capsule 0  . losartan (COZAAR) 50 MG tablet Take 1 tablet (50 mg total) by mouth daily. 30 tablet 12  . mometasone-formoterol (DULERA) 100-5 MCG/ACT AERO Inhale 2 puffs into the lungs 2 (two) times daily. 3 Inhaler 3  . nitroGLYCERIN (NITROSTAT) 0.4 MG SL tablet Place 1 tablet (0.4 mg total) under the tongue every 5 (five) minutes x 3 doses as needed for chest pain. 25 tablet 3  . omeprazole (PRILOSEC) 10 MG capsule Take 10 mg by mouth daily.    . potassium chloride (K-DUR) 10 MEQ tablet Take 1 tablet (10 mEq total) by mouth daily. (Patient not taking: Reported on 03/08/2014) 30 tablet 2   No facility-administered medications prior to visit.    ROS Review of Systems  Constitutional: Negative for fever and chills.  Eyes: Negative for visual disturbance.  Respiratory: Positive for shortness of breath (SOB with exertion that is moderate to strenous ).   Cardiovascular: Positive for leg swelling. Negative for chest pain.  Gastrointestinal: Negative for abdominal pain and blood in stool.  Musculoskeletal: Negative for back pain and arthralgias.  Skin: Negative for rash.  Allergic/Immunologic: Negative for immunocompromised state.  Hematological: Negative for adenopathy. Does not bruise/bleed easily.  Psychiatric/Behavioral: Negative for suicidal ideas and dysphoric mood.    Objective:  BP 173/104 mmHg  Pulse 66  Temp(Src) 98.5 F (36.9 C) (Oral)  Resp 16  Ht 5' 8.5" (1.74 m)  Wt 216 lb (97.977 kg)  BMI 32.36 kg/m2  SpO2 98%  BP/Weight 06/07/2015 03/08/2014 05/11/2013  Systolic BP 173 169 168  Diastolic BP 104 82 95  Wt. (Lbs) 216 227 222  BMI 32.36 34.52 33.76    Physical Exam  Constitutional: She is oriented to person, place, and time. She appears well-developed and  well-nourished. No distress.  HENT:  Head: Normocephalic and atraumatic.  Cardiovascular: Normal rate, regular rhythm, normal heart sounds and intact distal pulses.   Negative carotid bruit b/l, healed R carotid endarterectomy surgical scar.   Pulmonary/Chest: Effort normal and breath sounds normal.  Musculoskeletal: She exhibits edema (trace b/l LE ).  Neurological: She is alert and oriented to person, place, and time.  Skin: Skin is warm and dry. No rash noted.  Psychiatric: She has a normal mood and affect.   Lab Results  Component Value Date   HGBA1C 5.7* 09/27/2012    Assessment & Plan:   There are no diagnoses linked to this encounter. Phila was seen today for hypertension.  Diagnoses and all orders for this visit:  Coronary atherosclerosis of autologous vein bypass graft without angina -     Ambulatory referral to Cardiology -     aspirin 81 MG tablet; Take 1 tablet (81 mg total) by mouth daily. Reported on 06/07/2015 -     atorvastatin (LIPITOR) 80 MG tablet; Take 1 tablet (80 mg total) by mouth daily at 6 PM. -     carvedilol (COREG) 6.25 MG tablet; Take 0.5 tablets (3.125 mg total) by mouth 2 (two) times daily with a meal. -     clopidogrel (PLAVIX) 75 MG tablet; Take 1 tablet (75 mg total) by mouth daily with breakfast. -     nitroGLYCERIN (NITROSTAT) 0.4 MG SL tablet; Place 1 tablet (0.4 mg total) under the tongue every 5 (five) minutes x 3 doses as needed for chest pain. -     Lipid Panel  History of inferior wall myocardial infarction -     Ambulatory referral to Cardiology  Hyperlipidemia -     atorvastatin (LIPITOR) 80 MG tablet; Take 1 tablet (80 mg total) by mouth daily at 6 PM.  Essential hypertension -     carvedilol (COREG) 6.25 MG tablet; Take 0.5 tablets (3.125 mg total) by mouth 2 (two) times daily with a meal. -     losartan (COZAAR) 50 MG tablet; Take 0.5 tablets (25 mg total) by mouth 2 (two) times daily. -     COMPLETE METABOLIC PANEL WITH GFR -      hydrochlorothiazide (HYDRODIURIL) 25 MG tablet; Take 1 tablet (25 mg total) by mouth daily.  Chronic diastolic congestive heart failure (HCC) -     carvedilol (COREG) 6.25 MG tablet; Take 0.5 tablets (3.125 mg total) by mouth 2 (two) times daily with a meal.  Gastroesophageal reflux disease, esophagitis presence not specified -     Discontinue: omeprazole (PRILOSEC) 10 MG capsule; Take 1 capsule (10 mg total) by mouth daily. -     ranitidine (ZANTAC) 150 MG tablet; Take 1 tablet (150 mg total) by mouth 2 (two) times daily.  Chronic obstructive pulmonary disease, unspecified COPD type (HCC) -     mometasone-formoterol (DULERA) 100-5 MCG/ACT AERO; Inhale 2 puffs into the lungs 2 (two) times daily.  Screening for HIV (human immunodeficiency virus) -     HIV antibody (with reflex)  Need for hepatitis C screening test -     Hepatitis C antibody, reflex  Visit for screening mammogram -     MM DIGITAL SCREENING BILATERAL; Future   Meds ordered this encounter  Medications  . aspirin 81 MG tablet    Sig: Take 1 tablet (81 mg total) by mouth daily. Reported on 06/07/2015    Dispense:  90 tablet    Refill:  3  . atorvastatin (LIPITOR) 80 MG tablet    Sig: Take 1 tablet (80 mg total) by mouth daily at 6 PM.    Dispense:  90 tablet    Refill:  3  . carvedilol (COREG) 6.25 MG tablet    Sig: Take 0.5 tablets (3.125 mg total) by mouth 2 (two) times daily with a meal.    Dispense:  180 tablet    Refill:  3  . clopidogrel (PLAVIX) 75 MG tablet    Sig: Take 1 tablet (75 mg total) by mouth daily with breakfast.    Dispense:  30 tablet    Refill:  12  . losartan (COZAAR) 50 MG tablet    Sig: Take 0.5 tablets (25 mg total) by mouth 2 (two) times daily.    Dispense:  90 tablet    Refill:  3  . mometasone-formoterol (DULERA) 100-5 MCG/ACT AERO    Sig: Inhale 2 puffs into the lungs 2 (two) times daily.    Dispense:  3 Inhaler    Refill:  3  . nitroGLYCERIN (NITROSTAT) 0.4 MG SL tablet    Sig:  Place 1 tablet (0.4 mg total) under the tongue every 5 (five) minutes x 3 doses as needed for chest pain.    Dispense:  25 tablet    Refill:  3  . DISCONTD: omeprazole (PRILOSEC) 10 MG capsule    Sig: Take 1 capsule (10 mg total) by mouth daily.    Dispense:  90 capsule    Refill:  3  . hydrochlorothiazide (HYDRODIURIL) 25 MG tablet    Sig: Take 1 tablet (25 mg total) by mouth daily.    Dispense:  90 tablet    Refill:  3  . ranitidine (ZANTAC) 150 MG tablet    Sig: Take 1 tablet (150 mg total) by mouth 2 (two) times daily.    Dispense:  180 tablet    Refill:  3    DC prilosec please, it interacts with plavix.    Follow-up: No Follow-up on file.   Dessa Phi MD

## 2015-06-07 NOTE — Assessment & Plan Note (Addendum)
A: uncontrolled due to med non compliance P: Refilled losartan 50 mg total daily dose Refilled HCTZ increased to 25 mg  Refilled coreg 6.35 mg total daily dose  Check CMP, checking for K+, GFR  Reviewed labs, elevated K+, in setting of CKD, HTN, non compliance with meds, plan stop losartan for now, may be able to restart in near future as patient is also taking lasix and HCTZ which will lower K+

## 2015-06-08 LAB — HIV ANTIBODY (ROUTINE TESTING W REFLEX): HIV 1&2 Ab, 4th Generation: NONREACTIVE

## 2015-06-08 LAB — HEPATITIS C ANTIBODY: HCV AB: NEGATIVE

## 2015-06-13 ENCOUNTER — Telehealth: Payer: Self-pay | Admitting: *Deleted

## 2015-06-13 DIAGNOSIS — E875 Hyperkalemia: Secondary | ICD-10-CM | POA: Insufficient documentation

## 2015-06-13 DIAGNOSIS — N183 Chronic kidney disease, stage 3 unspecified: Secondary | ICD-10-CM | POA: Insufficient documentation

## 2015-06-13 NOTE — Assessment & Plan Note (Signed)
Reviewed labs, elevated K+, in setting of CKD, HTN, non compliance with meds, plan stop losartan for now, may be able to restart in near future as patient is also taking lasix and HCTZ which will lower K+

## 2015-06-13 NOTE — Telephone Encounter (Signed)
-----   Message from Dessa Phi, MD sent at 06/13/2015 10:04 AM EDT ----- Mildly elevated potassium with stage 3A chronic kidney disease in setting of uncontrolled HTN Due to high potassium, STOP taking losartan as high potassium can cause heart arrhythmia and cardiac arrest  Must control BP and be compliant with all meds and follow up   Cholesterol is high, be to take lipitor 80 mg daily   Screening HIV and Hep C negative

## 2015-06-13 NOTE — Telephone Encounter (Signed)
LVM to return call.

## 2015-06-13 NOTE — Addendum Note (Signed)
Addended by: Dessa Phi on: 06/13/2015 10:08 AM   Modules accepted: Orders, SmartSet

## 2015-06-20 ENCOUNTER — Telehealth: Payer: Self-pay | Admitting: Family Medicine

## 2015-06-20 NOTE — Telephone Encounter (Signed)
New Message ° °This message is to inform you that we have made 3 consecutive attempts to contact the patient. We have also mailed a letter to the patient to inform them to call in and schedule. Although we were unsuccessful in these attempts we wanted you to be aware of our efforts. Will remove the patient from our work queue at this time. ° ° °Shanti °[facility] ° °

## 2015-06-20 NOTE — Telephone Encounter (Signed)
Noted  

## 2015-07-05 ENCOUNTER — Other Ambulatory Visit: Payer: Self-pay | Admitting: Family Medicine

## 2015-07-10 MED FILL — ?HYDROCHLOROTHIAZIDE 25 MG: 25 MG | 30 days supply | Qty: 30 | Fill #1

## 2015-07-10 MED FILL — ATORVASTATIN 80 MG TABLET: 80 | 30 days supply | Qty: 30 | Fill #1

## 2015-07-10 MED FILL — CLOPIDOGREL 75 MG TABLET: 75 | 30 days supply | Qty: 30 | Fill #1

## 2015-07-10 MED FILL — CARVEDILOL 6.25 MG TABLET: 6.25 | 30 days supply | Qty: 30 | Fill #1

## 2015-08-13 MED FILL — CARVEDILOL 6.25 MG TABLET: 6.25 | 30 days supply | Qty: 30 | Fill #2

## 2015-08-13 MED FILL — ATORVASTATIN 80 MG TABLET: 80 | 30 days supply | Qty: 30 | Fill #2

## 2015-08-13 MED FILL — CLOPIDOGREL 75 MG TABLET: 75 | 30 days supply | Qty: 30 | Fill #2

## 2015-08-13 MED FILL — HYDROCHLOROTHIAZIDE 25 MG T: 25 | 30 days supply | Qty: 30 | Fill #2

## 2015-09-13 MED FILL — ATORVASTATIN 80 MG TABLET: 80 | 30 days supply | Qty: 30 | Fill #3

## 2015-09-13 MED FILL — HYDROCHLOROTHIAZIDE 25 MG T: 25 | 30 days supply | Qty: 30 | Fill #3

## 2015-09-13 MED FILL — CLOPIDOGREL 75 MG TABLET: 75 | 30 days supply | Qty: 30 | Fill #3

## 2015-09-13 MED FILL — CARVEDILOL 6.25 MG TABLET: 6.25 | 30 days supply | Qty: 30 | Fill #3

## 2015-10-16 MED FILL — CARVEDILOL 6.25 MG TABLET: 6.25 | 30 days supply | Qty: 30 | Fill #4

## 2015-10-16 MED FILL — HYDROCHLOROTHIAZIDE 25 MG T: 25 | 30 days supply | Qty: 30 | Fill #4

## 2015-10-16 MED FILL — CLOPIDOGREL 75 MG TABLET: 75 | 30 days supply | Qty: 30 | Fill #4

## 2015-10-16 MED FILL — ATORVASTATIN 80 MG TABLET: 80 | 30 days supply | Qty: 30 | Fill #4

## 2015-10-17 ENCOUNTER — Telehealth: Payer: Self-pay | Admitting: Family Medicine

## 2015-10-17 NOTE — Telephone Encounter (Signed)
Pt came to the office to drop off document for jury duty that she needs PCP to look at as well as write a letter explaining why patient cannot serve. Pt stated that she needs letter as soon as possible. Court date is 9/27 and she needs letter 10 days before date. Left document in PCP mailbox. Please follow up.  Thank you

## 2015-10-17 NOTE — Telephone Encounter (Signed)
I forwarded this to Dr Armen Pickup. thx

## 2015-10-17 NOTE — Telephone Encounter (Signed)
Will forward to pcp

## 2015-10-18 NOTE — Telephone Encounter (Signed)
Please inform patient that letter for jury duty excuse is written and ready for pick up

## 2015-10-19 ENCOUNTER — Telehealth: Payer: Self-pay

## 2015-10-19 NOTE — Telephone Encounter (Signed)
Pt was called on 9/14 and informed of paperwork being ready for pick up

## 2015-11-12 MED FILL — ATORVASTATIN 80 MG TABLET: 80 | 30 days supply | Qty: 30 | Fill #5

## 2015-11-12 MED FILL — CARVEDILOL 6.25 MG TABLET: 6.25 | 30 days supply | Qty: 30 | Fill #5

## 2015-11-12 MED FILL — HYDROCHLOROTHIAZIDE 25 MG T: 25 | 30 days supply | Qty: 30 | Fill #5

## 2015-11-12 MED FILL — CLOPIDOGREL 75 MG TABLET: 75 | 30 days supply | Qty: 30 | Fill #5

## 2015-11-21 ENCOUNTER — Encounter: Payer: Self-pay | Admitting: Family Medicine

## 2015-11-21 ENCOUNTER — Ambulatory Visit: Payer: Self-pay | Attending: Family Medicine | Admitting: Family Medicine

## 2015-11-21 VITALS — BP 206/115 | HR 72 | Temp 98.6°F | Ht 68.5 in | Wt 217.0 lb

## 2015-11-21 DIAGNOSIS — Z79899 Other long term (current) drug therapy: Secondary | ICD-10-CM | POA: Insufficient documentation

## 2015-11-21 DIAGNOSIS — B029 Zoster without complications: Secondary | ICD-10-CM

## 2015-11-21 DIAGNOSIS — K219 Gastro-esophageal reflux disease without esophagitis: Secondary | ICD-10-CM | POA: Insufficient documentation

## 2015-11-21 DIAGNOSIS — Z7982 Long term (current) use of aspirin: Secondary | ICD-10-CM | POA: Insufficient documentation

## 2015-11-21 DIAGNOSIS — I10 Essential (primary) hypertension: Secondary | ICD-10-CM

## 2015-11-21 DIAGNOSIS — Z87891 Personal history of nicotine dependence: Secondary | ICD-10-CM | POA: Insufficient documentation

## 2015-11-21 DIAGNOSIS — Z7902 Long term (current) use of antithrombotics/antiplatelets: Secondary | ICD-10-CM | POA: Insufficient documentation

## 2015-11-21 DIAGNOSIS — I5032 Chronic diastolic (congestive) heart failure: Secondary | ICD-10-CM | POA: Insufficient documentation

## 2015-11-21 DIAGNOSIS — I110 Hypertensive heart disease with heart failure: Secondary | ICD-10-CM | POA: Insufficient documentation

## 2015-11-21 DIAGNOSIS — I25810 Atherosclerosis of coronary artery bypass graft(s) without angina pectoris: Secondary | ICD-10-CM | POA: Insufficient documentation

## 2015-11-21 HISTORY — DX: Zoster without complications: B02.9

## 2015-11-21 MED ORDER — FUROSEMIDE 40 MG PO TABS: 40.0000 mg | ORAL_TABLET | Freq: Every day | ORAL | 3 refills | Status: DC

## 2015-11-21 MED ORDER — VALACYCLOVIR HCL 1 G PO TABS: 1000.0000 mg | ORAL_TABLET | Freq: Three times a day (TID) | ORAL | 0 refills | Status: DC

## 2015-11-21 MED ORDER — PANTOPRAZOLE SODIUM 40 MG PO TBEC: 40.0000 mg | DELAYED_RELEASE_TABLET | Freq: Every day | ORAL | 3 refills | Status: DC

## 2015-11-21 MED ORDER — GABAPENTIN 100 MG PO CAPS: ORAL_CAPSULE | ORAL | 1 refills | Status: DC

## 2015-11-21 MED ORDER — CARVEDILOL 6.25 MG PO TABS: 6.2500 mg | ORAL_TABLET | Freq: Two times a day (BID) | ORAL | 3 refills | Status: DC

## 2015-11-21 MED FILL — GABAPENTIN 100 MG CAPSULE: 100 | 33 days supply | Qty: 90 | Fill #0

## 2015-11-21 MED FILL — PANTOPRAZOLE SOD DR 40 MG T: 40 | 30 days supply | Qty: 30 | Fill #0

## 2015-11-21 MED FILL — ?VALACYCLOVIR HCL 1 GRAM TA: 1 | 7 days supply | Qty: 21 | Fill #0

## 2015-11-21 MED FILL — FUROSEMIDE 40 MG TABLET: 40 | 30 days supply | Qty: 30 | Fill #0

## 2015-11-21 NOTE — Progress Notes (Signed)
Subjective:  Patient ID: [name], female    DOB: October 12, 1950  Age: 65 y.o. MRN: [id number]  CC: Herpes Zoster   HPI [name]  Has severe HTN, hx of CAD, diastolic CHF she presents for   1. ? Shingles:  Two weeks developed a rash on L upper medial arm preceded by 3 days of tingling and burning in arm. Rash is limited to a small area. Paresthesias are spreading down arm and up to axilla. Also have trouble sleeping which is long term.   2. Severe HTN: she has severe HTN. Taking HCTZ 25 mg daily and coreg 3.125 mg BID. She reports intolerance with ace inhibitors and arbs. She denies HA, CP, SOB. She has leg swelling.   3. GERD: zantac did not help. Had to take 2-4 pills. Requesting change in medication.   Social History  Substance Use Topics  . Smoking status: Former Smoker    Packs/day: 0.50  . Smokeless tobacco: Former NeurosurgeonUser    Quit date: 04/17/2013     Comment: 1/2 pack per day since 2005, 40 pack years  . Alcohol use No    Outpatient Medications Prior to Visit  Medication Sig Dispense Refill  . aspirin 81 MG tablet Take 1 tablet (81 mg total) by mouth daily. Reported on 06/07/2015 90 tablet 3  . atorvastatin (LIPITOR) 80 MG tablet Take 1 tablet (80 mg total) by mouth daily at 6 PM. 90 tablet 3  . carvedilol (COREG) 6.25 MG tablet Take 0.5 tablets (3.125 mg total) by mouth 2 (two) times daily with a meal. 180 tablet 3  . clopidogrel (PLAVIX) 75 MG tablet Take 1 tablet (75 mg total) by mouth daily with breakfast. 30 tablet 12  . furosemide (LASIX) 40 MG tablet Take 1 tablet (40 mg total) by mouth daily. (Patient not taking: Reported on 03/08/2014) 30 tablet 3  . hydrochlorothiazide (HYDRODIURIL) 25 MG tablet Take 1 tablet (25 mg total) by mouth daily. 90 tablet 3  . mometasone-formoterol (DULERA) 100-5 MCG/ACT AERO Inhale 2 puffs into the lungs 2 (two) times daily. 3 Inhaler 3  . nitroGLYCERIN (NITROSTAT) 0.4 MG SL tablet Place 1 tablet (0.4 mg total) under the tongue every 5  (five) minutes x 3 doses as needed for chest pain. 25 tablet 3  . ranitidine (ZANTAC) 150 MG tablet Take 1 tablet (150 mg total) by mouth 2 (two) times daily. 180 tablet 3   No facility-administered medications prior to visit.     ROS Review of Systems  Constitutional: Negative for chills and fever.  Eyes: Negative for visual disturbance.  Respiratory: Negative for shortness of breath.   Cardiovascular: Positive for leg swelling. Negative for chest pain.  Gastrointestinal: Negative for abdominal pain and blood in stool.  Musculoskeletal: Negative for arthralgias and back pain.  Skin: Positive for rash.  Allergic/Immunologic: Negative for immunocompromised state.  Hematological: Negative for adenopathy. Does not bruise/bleed easily.  Psychiatric/Behavioral: Negative for dysphoric mood and suicidal ideas.    Objective:  BP (!) 206/115 (BP Location: Right Arm, Patient Position: Sitting, Cuff Size: Small)   Pulse 72   Temp 98.6 F (37 C) (Oral)   Ht 5' 8.5" (1.74 m)   Wt 217 lb (98.4 kg)   SpO2 98%   BMI 32.51 kg/m   BP/Weight 11/21/2015 06/07/2015 03/08/2014  Systolic BP 206 173 169  Diastolic BP 115 104 82  Wt. (Lbs) 217 216 227  BMI 32.51 32.36 34.52    Physical Exam  Constitutional: She  is oriented to person, place, and time. She appears well-developed and well-nourished. No distress.  HENT:  Head: Normocephalic and atraumatic.  Cardiovascular: Normal rate, regular rhythm, normal heart sounds and intact distal pulses.   Pulmonary/Chest: Effort normal and breath sounds normal.  Musculoskeletal: She exhibits edema.  Neurological: She is alert and oriented to person, place, and time.  Skin: Skin is warm and dry. No rash noted.     Psychiatric: She has a normal mood and affect.     Assessment & Plan:  Gilberto was seen today for herpes zoster.  Diagnoses and all orders for this visit:  Gastroesophageal reflux disease, esophagitis presence not specified -      pantoprazole (PROTONIX) 40 MG tablet; Take 1 tablet (40 mg total) by mouth daily.  Coronary atherosclerosis of autologous vein bypass graft without angina -     carvedilol (COREG) 6.25 MG tablet; Take 1 tablet (6.25 mg total) by mouth 2 (two) times daily with a meal.  Essential hypertension -     carvedilol (COREG) 6.25 MG tablet; Take 1 tablet (6.25 mg total) by mouth 2 (two) times daily with a meal. -     furosemide (LASIX) 40 MG tablet; Take 1 tablet (40 mg total) by mouth daily.  Chronic diastolic congestive heart failure (HCC) -     carvedilol (COREG) 6.25 MG tablet; Take 1 tablet (6.25 mg total) by mouth 2 (two) times daily with a meal. -     furosemide (LASIX) 40 MG tablet; Take 1 tablet (40 mg total) by mouth daily.  Herpes zoster without complication -     valACYclovir (VALTREX) 1000 MG tablet; Take 1 tablet (1,000 mg total) by mouth 3 (three) times daily. For 7 days -     gabapentin (NEURONTIN) 100 MG capsule; Take 100 mg at night for 3 days, then 200 mg at night for 3 days, then 300 mg nightly   There are no diagnoses linked to this encounter.  No orders of the defined types were placed in this encounter.   Follow-up: No Follow-up on file.   Dessa Phi MD

## 2015-11-21 NOTE — Assessment & Plan Note (Signed)
A: persistently uncontrolled HTN in patient with CAD and CHF, reporting intolerance to ACEi and ARBS P: Increase coreg to 6.25 mg BID Add lasix 40 mg daily Stop HCTZ

## 2015-11-21 NOTE — Assessment & Plan Note (Signed)
Limited shingles L medial arm Course of valtrex Gabapentin for nerve pain

## 2015-11-21 NOTE — Patient Instructions (Addendum)
[name] was seen today for herpes zoster.  Diagnoses and all orders for this visit:  Gastroesophageal reflux disease, esophagitis presence not specified -     pantoprazole (PROTONIX) 40 MG tablet; Take 1 tablet (40 mg total) by mouth daily.  Coronary atherosclerosis of autologous vein bypass graft without angina -     carvedilol (COREG) 6.25 MG tablet; Take 1 tablet (6.25 mg total) by mouth 2 (two) times daily with a meal.  Essential hypertension -     carvedilol (COREG) 6.25 MG tablet; Take 1 tablet (6.25 mg total) by mouth 2 (two) times daily with a meal. -     furosemide (LASIX) 40 MG tablet; Take 1 tablet (40 mg total) by mouth daily.  Chronic diastolic congestive heart failure (HCC) -     carvedilol (COREG) 6.25 MG tablet; Take 1 tablet (6.25 mg total) by mouth 2 (two) times daily with a meal. -     furosemide (LASIX) 40 MG tablet; Take 1 tablet (40 mg total) by mouth daily.  Herpes zoster without complication -     valACYclovir (VALTREX) 1000 MG tablet; Take 1 tablet (1,000 mg total) by mouth 3 (three) times daily. For 7 days -     gabapentin (NEURONTIN) 100 MG capsule; Take 100 mg at night for 3 days, then 200 mg at night for 3 days, then 300 mg nightly  low salt diet   F/u in 2 weeks for HTN , with plan to add hydralazine if still elevated   Dr. Armen Pickup    Shingles Shingles, which is also known as herpes zoster, is an infection that causes a painful skin rash and fluid-filled blisters. Shingles is not related to genital herpes, which is a sexually transmitted infection.   Shingles only develops in people who:  Have had chickenpox.  Have received the chickenpox vaccine. (This is rare.) CAUSES Shingles is caused by varicella-zoster virus (VZV). This is the same virus that causes chickenpox. After exposure to VZV, the virus stays in the body in an inactive (dormant) state. Shingles develops if the virus reactivates. This can happen many years after the initial exposure to  VZV. It is not known what causes this virus to reactivate. RISK FACTORS People who have had chickenpox or received the chickenpox vaccine are at risk for shingles. Infection is more common in people who:  Are older than age 46.  Have a weakened defense (immune) system, such as those with HIV, AIDS, or cancer.  Are taking medicines that weaken the immune system, such as transplant medicines.  Are under great stress. SYMPTOMS Early symptoms of this condition include itching, tingling, and pain in an area on your skin. Pain may be described as burning, stabbing, or throbbing. A few days or weeks after symptoms start, a painful red rash appears, usually on one side of the body in a bandlike or beltlike pattern. The rash eventually turns into fluid-filled blisters that break open, scab over, and dry up in about 2-3 weeks. At any time during the infection, you may also develop:  A fever.  Chills.  A headache.  An upset stomach. DIAGNOSIS This condition is diagnosed with a skin exam. Sometimes, skin or fluid samples are taken from the blisters before a diagnosis is made. These samples are examined under a microscope or sent to a lab for testing. TREATMENT There is no specific cure for this condition. Your health care provider will probably prescribe medicines to help you manage pain, recover more quickly, and avoid long-term problems.  Medicines may include:  Antiviral drugs.  Anti-inflammatory drugs.  Pain medicines. If the area involved is on your face, you may be referred to a specialist, such as an eye doctor (ophthalmologist) or an ear, nose, and throat (ENT) doctor to help you avoid eye problems, chronic pain, or disability. HOME CARE INSTRUCTIONS Medicines  Take medicines only as directed by your health care provider.  Apply an anti-itch or numbing cream to the affected area as directed by your health care provider. Blister and Rash Care  Take a cool bath or apply cool  compresses to the area of the rash or blisters as directed by your health care provider. This may help with pain and itching.  Keep your rash covered with a loose bandage (dressing). Wear loose-fitting clothing to help ease the pain of material rubbing against the rash.  Keep your rash and blisters clean with mild soap and cool water or as directed by your health care provider.  Check your rash every day for signs of infection. These include redness, swelling, and pain that lasts or increases.  Do not pick your blisters.  Do not scratch your rash. General Instructions  Rest as directed by your health care provider.  Keep all follow-up visits as directed by your health care provider. This is important.  Until your blisters scab over, your infection can cause chickenpox in people who have never had it or been vaccinated against it. To prevent this from happening, avoid contact with other people, especially:  Babies.  Pregnant women.  Children who have eczema.  Elderly people who have transplants.  People who have chronic illnesses, such as leukemia or AIDS. SEEK MEDICAL CARE IF:  Your pain is not relieved with prescribed medicines.  Your pain does not get better after the rash heals.  Your rash looks infected. Signs of infection include redness, swelling, and pain that lasts or increases. SEEK IMMEDIATE MEDICAL CARE IF:  The rash is on your face or nose.  You have facial pain, pain around your eye area, or loss of feeling on one side of your face.  You have ear pain or you have ringing in your ear.  You have loss of taste.  Your condition gets worse.   This information is not intended to replace advice given to you by your health care provider. Make sure you discuss any questions you have with your health care provider.   Document Released: 01/20/2005 Document Revised: 02/10/2014 Document Reviewed: 12/01/2013 Elsevier Interactive Patient Education [employer]2016 [employer]

## 2015-11-21 NOTE — Progress Notes (Signed)
Pt is here for a possible case of shingles.  Pt is having trouble sleeping.

## 2015-12-11 MED FILL — ATORVASTATIN 80 MG TABLET: 80 | 30 days supply | Qty: 30 | Fill #6

## 2015-12-11 MED FILL — CLOPIDOGREL 75 MG TABLET: 75 | 30 days supply | Qty: 30 | Fill #6

## 2015-12-11 MED FILL — CARVEDILOL 6.25 MG TABLET: 6.25 | 30 days supply | Qty: 30 | Fill #6

## 2016-01-14 MED FILL — FUROSEMIDE 40 MG TABLET: 40 | 30 days supply | Qty: 30 | Fill #1

## 2016-01-14 MED FILL — CLOPIDOGREL 75 MG TABLET: 75 | 30 days supply | Qty: 30 | Fill #7

## 2016-01-14 MED FILL — CARVEDILOL 6.25 MG TABLET: 6.25 | 30 days supply | Qty: 30 | Fill #7

## 2016-01-14 MED FILL — ATORVASTATIN 80 MG TABLET: 80 | 30 days supply | Qty: 30 | Fill #7

## 2016-02-15 MED FILL — CARVEDILOL 6.25 MG TABLET: 6.25 | 30 days supply | Qty: 30 | Fill #8

## 2016-02-15 MED FILL — GABAPENTIN 100 MG CAPSULE: 100 | 33 days supply | Qty: 90 | Fill #1

## 2016-02-15 MED FILL — ATORVASTATIN 80 MG TABLET: 80 | 30 days supply | Qty: 30 | Fill #8

## 2016-02-15 MED FILL — CLOPIDOGREL 75 MG TABLET: 75 | 30 days supply | Qty: 30 | Fill #8

## 2016-03-17 MED FILL — ATORVASTATIN 80 MG TABLET: 80 | 30 days supply | Qty: 30 | Fill #9

## 2016-03-17 MED FILL — ?CARVEDILOL 6.25 MG TABLET: 6.25 | 30 days supply | Qty: 30 | Fill #9

## 2016-03-17 MED FILL — CLOPIDOGREL 75 MG TABLET: 75 | 30 days supply | Qty: 30 | Fill #9

## 2016-04-15 MED FILL — CLOPIDOGREL 75 MG TABLET: 75 | 30 days supply | Qty: 30 | Fill #10

## 2016-04-15 MED FILL — CARVEDILOL 6.25 MG TABLET: 6.25 | 30 days supply | Qty: 30 | Fill #10

## 2016-04-15 MED FILL — ?FUROSEMIDE 40 MG TABLET: 40 | 30 days supply | Qty: 30 | Fill #2

## 2016-04-15 MED FILL — ATORVASTATIN 80 MG TABLET: 80 | 30 days supply | Qty: 30 | Fill #10

## 2016-05-16 MED FILL — CARVEDILOL 6.25 MG TABLET: 6.25 | 30 days supply | Qty: 30 | Fill #11

## 2016-05-16 MED FILL — CLOPIDOGREL 75 MG TABLET: 75 | 30 days supply | Qty: 30 | Fill #11

## 2016-05-16 MED FILL — ATORVASTATIN 80 MG TABLET: 80 | 30 days supply | Qty: 30 | Fill #11

## 2016-06-18 ENCOUNTER — Encounter: Payer: Self-pay | Admitting: Family Medicine

## 2016-06-23 ENCOUNTER — Telehealth: Payer: Self-pay | Admitting: Family Medicine

## 2016-06-23 DIAGNOSIS — E785 Hyperlipidemia, unspecified: Secondary | ICD-10-CM

## 2016-06-23 DIAGNOSIS — I25810 Atherosclerosis of coronary artery bypass graft(s) without angina pectoris: Secondary | ICD-10-CM

## 2016-06-23 DIAGNOSIS — I10 Essential (primary) hypertension: Secondary | ICD-10-CM

## 2016-06-23 DIAGNOSIS — I5032 Chronic diastolic (congestive) heart failure: Secondary | ICD-10-CM

## 2016-06-23 MED ORDER — CARVEDILOL 6.25 MG PO TABS: 6.2500 mg | ORAL_TABLET | Freq: Two times a day (BID) | ORAL | 0 refills | Status: DC

## 2016-06-23 MED ORDER — ATORVASTATIN CALCIUM 80 MG PO TABS: 80.0000 mg | ORAL_TABLET | Freq: Every day | ORAL | 0 refills | Status: DC

## 2016-06-23 MED ORDER — CLOPIDOGREL BISULFATE 75 MG PO TABS: 75.0000 mg | ORAL_TABLET | Freq: Every day | ORAL | 0 refills | Status: DC

## 2016-06-23 MED ORDER — FUROSEMIDE 40 MG PO TABS: 40.0000 mg | ORAL_TABLET | Freq: Every day | ORAL | 0 refills | Status: DC

## 2016-06-23 MED FILL — ATORVASTATIN 80 MG TABLET: 80 | 30 days supply | Qty: 30 | Fill #0

## 2016-06-23 MED FILL — CLOPIDOGREL 75 MG TABLET: 75 | 30 days supply | Qty: 30 | Fill #0

## 2016-06-23 MED FILL — FUROSEMIDE 40 MG TABLET: 40 | 30 days supply | Qty: 30 | Fill #0

## 2016-06-23 MED FILL — CARVEDILOL 6.25 MG TABLET: 6.25 | 30 days supply | Qty: 60 | Fill #0

## 2016-06-23 NOTE — Telephone Encounter (Signed)
Requested medications refilled x 30 days 

## 2016-06-23 NOTE — Telephone Encounter (Signed)
Patient called the office asking for one month refill on both heart and bp medication. Pt is schedule for 6/15 with PCP. There aren't any sooner appt.   Thank you.

## 2016-06-24 ENCOUNTER — Other Ambulatory Visit: Payer: Self-pay | Admitting: Family Medicine

## 2016-06-24 DIAGNOSIS — I10 Essential (primary) hypertension: Secondary | ICD-10-CM

## 2016-06-24 DIAGNOSIS — I5032 Chronic diastolic (congestive) heart failure: Secondary | ICD-10-CM

## 2016-06-24 DIAGNOSIS — E785 Hyperlipidemia, unspecified: Secondary | ICD-10-CM

## 2016-06-24 DIAGNOSIS — I25810 Atherosclerosis of coronary artery bypass graft(s) without angina pectoris: Secondary | ICD-10-CM

## 2016-06-24 NOTE — Telephone Encounter (Signed)
Patient medication refill

## 2016-06-24 NOTE — Telephone Encounter (Signed)
Patient refill request -

## 2016-07-07 MED ORDER — CLOPIDOGREL BISULFATE 75 MG PO TABS: 75.0000 mg | ORAL_TABLET | Freq: Every day | ORAL | 0 refills | Status: DC

## 2016-07-07 MED ORDER — CARVEDILOL 6.25 MG PO TABS: 6.2500 mg | ORAL_TABLET | Freq: Two times a day (BID) | ORAL | 0 refills | Status: DC

## 2016-07-07 MED ORDER — ATORVASTATIN CALCIUM 80 MG PO TABS: 80.0000 mg | ORAL_TABLET | Freq: Every day | ORAL | 0 refills | Status: DC

## 2016-07-07 MED ORDER — FUROSEMIDE 40 MG PO TABS: 40.0000 mg | ORAL_TABLET | Freq: Every day | ORAL | 0 refills | Status: DC

## 2016-07-18 ENCOUNTER — Encounter: Payer: Self-pay | Admitting: Family Medicine

## 2016-07-18 ENCOUNTER — Ambulatory Visit: Payer: Self-pay | Attending: Family Medicine | Admitting: Family Medicine

## 2016-07-18 VITALS — BP 158/100 | HR 86 | Temp 98.7°F | Resp 16 | Wt 226.8 lb

## 2016-07-18 DIAGNOSIS — F329 Major depressive disorder, single episode, unspecified: Secondary | ICD-10-CM | POA: Insufficient documentation

## 2016-07-18 DIAGNOSIS — G4700 Insomnia, unspecified: Secondary | ICD-10-CM | POA: Insufficient documentation

## 2016-07-18 DIAGNOSIS — N183 Chronic kidney disease, stage 3 unspecified: Secondary | ICD-10-CM

## 2016-07-18 DIAGNOSIS — Z7982 Long term (current) use of aspirin: Secondary | ICD-10-CM | POA: Insufficient documentation

## 2016-07-18 DIAGNOSIS — I25810 Atherosclerosis of coronary artery bypass graft(s) without angina pectoris: Secondary | ICD-10-CM | POA: Insufficient documentation

## 2016-07-18 DIAGNOSIS — Z951 Presence of aortocoronary bypass graft: Secondary | ICD-10-CM | POA: Insufficient documentation

## 2016-07-18 DIAGNOSIS — I130 Hypertensive heart and chronic kidney disease with heart failure and stage 1 through stage 4 chronic kidney disease, or unspecified chronic kidney disease: Secondary | ICD-10-CM | POA: Insufficient documentation

## 2016-07-18 DIAGNOSIS — J449 Chronic obstructive pulmonary disease, unspecified: Secondary | ICD-10-CM | POA: Insufficient documentation

## 2016-07-18 DIAGNOSIS — I5032 Chronic diastolic (congestive) heart failure: Secondary | ICD-10-CM | POA: Insufficient documentation

## 2016-07-18 DIAGNOSIS — I10 Essential (primary) hypertension: Secondary | ICD-10-CM

## 2016-07-18 DIAGNOSIS — Z87891 Personal history of nicotine dependence: Secondary | ICD-10-CM | POA: Insufficient documentation

## 2016-07-18 MED ORDER — TRAZODONE HCL 50 MG PO TABS: 25.0000 mg | ORAL_TABLET | Freq: Every evening | ORAL | 1 refills | Status: DC | PRN

## 2016-07-18 MED ORDER — FUROSEMIDE 40 MG PO TABS: 40.0000 mg | ORAL_TABLET | Freq: Every day | ORAL | 2 refills | Status: DC

## 2016-07-18 MED ORDER — CARVEDILOL 12.5 MG PO TABS: 12.5000 mg | ORAL_TABLET | Freq: Two times a day (BID) | ORAL | 2 refills | Status: DC

## 2016-07-18 MED ORDER — CLONIDINE HCL 0.1 MG PO TABS
0.2000 mg | ORAL_TABLET | Freq: Once | ORAL | Status: AC
  Administered 2016-07-18: 0.2 mg via ORAL

## 2016-07-18 NOTE — Patient Instructions (Addendum)
[name] was seen today for medication refill and hypertension.  Diagnoses and all orders for this visit:  Essential hypertension -     CMP14+EGFR -     carvedilol (COREG) 12.5 MG tablet; Take 1 tablet (12.5 mg total) by mouth 2 (two) times daily with a meal. -     furosemide (LASIX) 40 MG tablet; Take 1 tablet (40 mg total) by mouth daily. -     cloNIDine (CATAPRES) tablet 0.2 mg; Take 2 tablets (0.2 mg total) by mouth once.  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  Chronic diastolic congestive heart failure (HCC) -     carvedilol (COREG) 12.5 MG tablet; Take 1 tablet (12.5 mg total) by mouth 2 (two) times daily with a meal. -     furosemide (LASIX) 40 MG tablet; Take 1 tablet (40 mg total) by mouth daily.  Insomnia, unspecified type -     traZODone (DESYREL) 50 MG tablet; Take 0.5-1 tablets (25-50 mg total) by mouth at bedtime as needed for sleep.   F/u in 4 weeks for pap smear and BP check/insomnia follow up  Dr. Adrian Blackwater

## 2016-07-18 NOTE — Progress Notes (Addendum)
Subjective:  Patient ID: [name], female    DOB: [birth year]  Age: 66 y.o. MRN: [id number]  CC: Medication Refill and Hypertension   HPI [name] has severe HTN, CAD s/p CABG, CKD sage 3 and COPD she  presents for   1. CHRONIC HYPERTENSION  Disease Monitoring  Blood pressure range: not checking   Chest pain: no   Dyspnea: yes   Claudication: no   Medication compliance: yes  Medication Side Effects  Lightheadedness: yes some mornings after taking lasix 40 mg and coreg 6.25 mg   Urinary frequency: yes   Edema: yes   Impotence:    Preventitive Healthcare:  Exercise: no   Diet Pattern: regular meals, low income. Reports eating cheap food that is highly processed   Salt Restriction: no   2. Insomnia: she reports trouble sleeping. She sleeps for 3-4 hrs per night. She has depression. She is depressed about her lack of income. Symptoms have been present for several months.   Social History  Substance Use Topics  . Smoking status: Former Smoker    Packs/day: 0.50  . Smokeless tobacco: Former Systems developer    Quit date: 04/17/2013     Comment: 1/2 pack per day since 2005, 40 pack years  . Alcohol use No    Outpatient Medications Prior to Visit  Medication Sig Dispense Refill  . aspirin 81 MG tablet Take 1 tablet (81 mg total) by mouth daily. Reported on 06/07/2015 90 tablet 3  . atorvastatin (LIPITOR) 80 MG tablet Take 1 tablet (80 mg total) by mouth daily at 6 PM. 30 tablet 0  . carvedilol (COREG) 6.25 MG tablet Take 1 tablet (6.25 mg total) by mouth 2 (two) times daily with a meal. 60 tablet 0  . clopidogrel (PLAVIX) 75 MG tablet Take 1 tablet (75 mg total) by mouth daily with breakfast. 30 tablet 0  . furosemide (LASIX) 40 MG tablet Take 1 tablet (40 mg total) by mouth daily. 30 tablet 0  . gabapentin (NEURONTIN) 100 MG capsule Take 100 mg at night for 3 days, then 200 mg at night for 3 days, then 300 mg nightly 90 capsule 1  . mometasone-formoterol (DULERA) 100-5 MCG/ACT  AERO Inhale 2 puffs into the lungs 2 (two) times daily. 3 Inhaler 3  . nitroGLYCERIN (NITROSTAT) 0.4 MG SL tablet Place 1 tablet (0.4 mg total) under the tongue every 5 (five) minutes x 3 doses as needed for chest pain. 25 tablet 3  . pantoprazole (PROTONIX) 40 MG tablet Take 1 tablet (40 mg total) by mouth daily. 30 tablet 3  . valACYclovir (VALTREX) 1000 MG tablet Take 1 tablet (1,000 mg total) by mouth 3 (three) times daily. For 7 days 21 tablet 0   No facility-administered medications prior to visit.     ROS Review of Systems  Constitutional: Negative for chills and fever.  Eyes: Negative for visual disturbance.  Respiratory: Positive for shortness of breath.   Cardiovascular: Positive for leg swelling (worse on R leg ). Negative for chest pain.  Gastrointestinal: Negative for abdominal pain and blood in stool.  Musculoskeletal: Negative for arthralgias and back pain.  Skin: Negative for rash.  Allergic/Immunologic: Negative for immunocompromised state.  Neurological: Positive for dizziness.  Hematological: Negative for adenopathy. Does not bruise/bleed easily.  Psychiatric/Behavioral: Positive for dysphoric mood and sleep disturbance. Negative for suicidal ideas.    Objective:  BP (!) 190/130   Pulse 86   Temp 98.7 F (37.1 C) (Oral)   Resp  16   Wt 226 lb 12.8 oz (102.9 kg)   SpO2 95%   BMI 33.98 kg/m   BP/Weight 07/18/2016 73/22/0254 03/12/621  Systolic BP 762 831 517  Diastolic BP 616 073 710  Wt. (Lbs) 226.8 217 216  BMI 33.98 32.51 32.36    Physical Exam  Constitutional: She is oriented to person, place, and time. She appears well-developed and well-nourished. No distress.  HENT:  Head: Normocephalic and atraumatic.  Cardiovascular: Normal rate, regular rhythm, normal heart sounds and intact distal pulses.   Pulmonary/Chest: Effort normal and breath sounds normal.  Musculoskeletal: Edema: trace b/l worse in R leg   Neurological: She is alert and oriented to  person, place, and time.  Skin: Skin is warm and dry. No rash noted.  Psychiatric: She has a normal mood and affect.   hypertensive 190/130 Treated with clonidine 0.2 mg PO x one Repeat BP 158/110 Depression screen [facility] 2/9 07/18/2016 11/21/2015 06/07/2015  Decreased Interest 1 0 0  Down, Depressed, Hopeless 0 1 0  PHQ - 2 Score 1 1 0  Altered sleeping - 3 3  Tired, decreased energy - 3 3  Change in appetite - 0 0  Feeling bad or failure about yourself  - 0 0  Trouble concentrating - 0 0  Moving slowly or fidgety/restless - 0 0  Suicidal thoughts - 0 0  PHQ-9 Score - 7 6   GAD 7 : Generalized Anxiety Score 07/18/2016 11/21/2015 06/07/2015  Nervous, Anxious, on Edge 0 1 0  Control/stop worrying (No Data) 0 0  Worry too much - different things 2 1 0  Trouble relaxing (No Data) 0 0  Restless 0 0 0  Easily annoyed or irritable 1 1 0  Afraid - awful might happen 0 0 0  Total GAD 7 Score - 3 0        Chemistry      Component Value Date/Time   NA 142 07/18/2016 1351   K 3.9 07/18/2016 1351   CL 102 07/18/2016 1351   CO2 23 07/18/2016 1351   BUN 12 07/18/2016 1351   CREATININE 1.21 (H) 07/18/2016 1351   CREATININE 1.22 (H) 06/07/2015 1007      Component Value Date/Time   CALCIUM 9.1 07/18/2016 1351   ALKPHOS 120 (H) 07/18/2016 1351   AST 14 07/18/2016 1351   ALT 16 07/18/2016 1351   BILITOT 0.3 07/18/2016 1351     . Assessment & Plan:  [name] was seen today for medication refill and hypertension.  Diagnoses and all orders for this visit:  Essential hypertension -     CMP14+EGFR -     carvedilol (COREG) 12.5 MG tablet; Take 1 tablet (12.5 mg total) by mouth 2 (two) times daily with a meal. -     furosemide (LASIX) 40 MG tablet; Take 1 tablet (40 mg total) by mouth daily. -     cloNIDine (CATAPRES) tablet 0.2 mg; Take 2 tablets (0.2 mg total) by mouth once.  CKD (chronic kidney disease) stage 3, GFR 30-59 ml/min -     CMP14+EGFR  Coronary atherosclerosis of autologous  vein bypass graft without angina -     carvedilol (COREG) 12.5 MG tablet; Take 1 tablet (12.5 mg total) by mouth 2 (two) times daily with a meal.  Chronic diastolic congestive heart failure (HCC) -     carvedilol (COREG) 12.5 MG tablet; Take 1 tablet (12.5 mg total) by mouth 2 (two) times daily with a meal. -  furosemide (LASIX) 40 MG tablet; Take 1 tablet (40 mg total) by mouth daily.  Insomnia, unspecified type -     traZODone (DESYREL) 50 MG tablet; Take 0.5-1 tablets (25-50 mg total) by mouth at bedtime as needed for sleep.   There are no diagnoses linked to this encounter.  No orders of the defined types were placed in this encounter.   Follow-up: Return in about 3 weeks (around 08/08/2016) for HTN, pap, insomnia .   Boykin Nearing MD

## 2016-07-19 ENCOUNTER — Encounter: Payer: Self-pay | Admitting: Family Medicine

## 2016-07-19 DIAGNOSIS — G4700 Insomnia, unspecified: Secondary | ICD-10-CM | POA: Insufficient documentation

## 2016-07-19 LAB — CMP14+EGFR
ALBUMIN: 4.3 g/dL (ref 3.6–4.8)
ALT: 16 IU/L (ref 0–32)
AST: 14 IU/L (ref 0–40)
Albumin/Globulin Ratio: 1.8 (ref 1.2–2.2)
Alkaline Phosphatase: 120 IU/L — ABNORMAL HIGH (ref 39–117)
BUN/Creatinine Ratio: 10 — ABNORMAL LOW (ref 12–28)
BUN: 12 mg/dL (ref 8–27)
Bilirubin Total: 0.3 mg/dL (ref 0.0–1.2)
CO2: 23 mmol/L (ref 20–29)
CREATININE: 1.21 mg/dL — AB (ref 0.57–1.00)
Calcium: 9.1 mg/dL (ref 8.7–10.3)
Chloride: 102 mmol/L (ref 96–106)
GFR calc Af Amer: 54 mL/min/{1.73_m2} — ABNORMAL LOW (ref >59)
GFR calc non Af Amer: 47 mL/min/{1.73_m2} — ABNORMAL LOW (ref >59)
GLUCOSE: 99 mg/dL (ref 65–99)
Globulin, Total: 2.4 g/dL (ref 1.5–4.5)
Potassium: 3.9 mmol/L (ref 3.5–5.2)
Sodium: 142 mmol/L (ref 134–144)
TOTAL PROTEIN: 6.7 g/dL (ref 6.0–8.5)

## 2016-07-19 NOTE — Assessment & Plan Note (Signed)
Stage CKD Will work on BP reduction

## 2016-07-19 NOTE — Assessment & Plan Note (Signed)
A: hypertensive, CAD s/p MI and CABG, CHF P: Increase coreg to 12.5 mg BID Continue lasix 40 mg daily

## 2016-07-19 NOTE — Assessment & Plan Note (Signed)
Trazodone ordered for insomnia with depressed mood

## 2016-07-21 ENCOUNTER — Other Ambulatory Visit: Payer: Self-pay

## 2016-07-21 DIAGNOSIS — I25810 Atherosclerosis of coronary artery bypass graft(s) without angina pectoris: Secondary | ICD-10-CM

## 2016-07-21 DIAGNOSIS — G4700 Insomnia, unspecified: Secondary | ICD-10-CM

## 2016-07-21 DIAGNOSIS — I10 Essential (primary) hypertension: Secondary | ICD-10-CM

## 2016-07-21 DIAGNOSIS — I5032 Chronic diastolic (congestive) heart failure: Secondary | ICD-10-CM

## 2016-07-21 MED ORDER — TRAZODONE HCL 50 MG PO TABS: 25.0000 mg | ORAL_TABLET | Freq: Every evening | ORAL | 1 refills | Status: DC | PRN

## 2016-07-21 MED ORDER — FUROSEMIDE 40 MG PO TABS: 40.0000 mg | ORAL_TABLET | Freq: Every day | ORAL | 2 refills | Status: DC

## 2016-07-21 MED ORDER — CARVEDILOL 12.5 MG PO TABS: 12.5000 mg | ORAL_TABLET | Freq: Two times a day (BID) | ORAL | 2 refills | Status: DC

## 2016-07-21 MED FILL — ?FUROSEMIDE 40 MG TABLET: 40 | 30 days supply | Qty: 30 | Fill #0

## 2016-07-21 MED FILL — ATORVASTATIN 80 MG TABLET: 80 | 30 days supply | Qty: 30 | Fill #0

## 2016-07-21 MED FILL — CARVEDILOL 6.25 MG TABLET: 6.25 | 30 days supply | Qty: 60 | Fill #0

## 2016-07-21 MED FILL — ?TRAZODONE 50 MG TABLET: 50 | 30 days supply | Qty: 30 | Fill #0

## 2016-07-21 MED FILL — ?CLOPIDOGREL 75 MG TABLET: 75 | 30 days supply | Qty: 30 | Fill #0

## 2016-07-22 ENCOUNTER — Other Ambulatory Visit: Payer: Self-pay | Admitting: Family Medicine

## 2016-07-22 DIAGNOSIS — E785 Hyperlipidemia, unspecified: Secondary | ICD-10-CM

## 2016-07-22 DIAGNOSIS — I25810 Atherosclerosis of coronary artery bypass graft(s) without angina pectoris: Secondary | ICD-10-CM

## 2016-07-23 ENCOUNTER — Telehealth: Payer: Self-pay

## 2016-07-23 ENCOUNTER — Other Ambulatory Visit: Payer: Self-pay

## 2016-07-23 DIAGNOSIS — E785 Hyperlipidemia, unspecified: Secondary | ICD-10-CM

## 2016-07-23 DIAGNOSIS — I25810 Atherosclerosis of coronary artery bypass graft(s) without angina pectoris: Secondary | ICD-10-CM

## 2016-07-23 MED ORDER — CLOPIDOGREL BISULFATE 75 MG PO TABS: 75.0000 mg | ORAL_TABLET | Freq: Every day | ORAL | 0 refills | Status: DC

## 2016-07-23 MED ORDER — ATORVASTATIN CALCIUM 80 MG PO TABS: 80.0000 mg | ORAL_TABLET | Freq: Every day | ORAL | 0 refills | Status: DC

## 2016-07-23 NOTE — Telephone Encounter (Signed)
Pt was called and informed of lab results. 

## 2016-07-28 NOTE — Telephone Encounter (Signed)
Refill request

## 2016-08-18 ENCOUNTER — Other Ambulatory Visit: Payer: Self-pay | Admitting: Family Medicine

## 2016-08-18 DIAGNOSIS — I25810 Atherosclerosis of coronary artery bypass graft(s) without angina pectoris: Secondary | ICD-10-CM

## 2016-08-18 MED FILL — ?CARVEDILOL 6.25 MG TABLET: 6.25 | 30 days supply | Qty: 60 | Fill #0

## 2016-08-18 MED FILL — ATORVASTATIN 80 MG TABLET: 80 | 30 days supply | Qty: 30 | Fill #0

## 2016-08-20 MED FILL — ?CLOPIDOGREL 75 MG TABLET: 75 | 30 days supply | Qty: 30 | Fill #0

## 2016-08-22 ENCOUNTER — Ambulatory Visit: Payer: Self-pay | Admitting: Family Medicine

## 2016-09-22 ENCOUNTER — Other Ambulatory Visit: Payer: Self-pay | Admitting: Family Medicine

## 2016-09-22 DIAGNOSIS — I25810 Atherosclerosis of coronary artery bypass graft(s) without angina pectoris: Secondary | ICD-10-CM

## 2016-09-22 MED FILL — FUROSEMIDE 40 MG TABLET: 40 | 30 days supply | Qty: 30 | Fill #3

## 2016-09-22 MED FILL — ?CLOPIDOGREL 75 MG TABLET: 75 | 30 days supply | Qty: 30 | Fill #0

## 2016-09-22 MED FILL — ATORVASTATIN 80 MG TABLET: 80 | 30 days supply | Qty: 30 | Fill #1

## 2016-09-22 MED FILL — ?CARVEDILOL 6.25 MG TABLET: 6.25 | 30 days supply | Qty: 60 | Fill #1

## 2016-09-26 ENCOUNTER — Telehealth: Payer: Self-pay | Admitting: Family Medicine

## 2016-09-26 DIAGNOSIS — E785 Hyperlipidemia, unspecified: Secondary | ICD-10-CM

## 2016-09-26 DIAGNOSIS — I5032 Chronic diastolic (congestive) heart failure: Secondary | ICD-10-CM

## 2016-09-26 DIAGNOSIS — I25810 Atherosclerosis of coronary artery bypass graft(s) without angina pectoris: Secondary | ICD-10-CM

## 2016-09-26 DIAGNOSIS — I10 Essential (primary) hypertension: Secondary | ICD-10-CM

## 2016-09-26 MED ORDER — ATORVASTATIN CALCIUM 80 MG PO TABS: 80.0000 mg | ORAL_TABLET | Freq: Every day | ORAL | 1 refills | Status: DC

## 2016-09-26 MED ORDER — FUROSEMIDE 40 MG PO TABS: 40.0000 mg | ORAL_TABLET | Freq: Every day | ORAL | 1 refills | Status: DC

## 2016-09-26 MED ORDER — CARVEDILOL 12.5 MG PO TABS: 12.5000 mg | ORAL_TABLET | Freq: Two times a day (BID) | ORAL | 1 refills | Status: DC

## 2016-09-26 MED ORDER — CLOPIDOGREL BISULFATE 75 MG PO TABS: 75.0000 mg | ORAL_TABLET | Freq: Every day | ORAL | 1 refills | Status: DC

## 2016-09-26 NOTE — Telephone Encounter (Signed)
Requested medications refilled 

## 2016-09-26 NOTE — Telephone Encounter (Signed)
Patient called requesting medication refill for the month of sept, pt has an appt to re-est care with Dr. Laural Benes 10/29 but will be out of medication since she just pick up last refill. Pt needs refills on carvedilol (COREG) 12.5 MG tablet ,furosemide (LASIX) 40 MG tablet,clopidogrel (PLAVIX) 75 MG tablet atorvastatin (LIPITOR) 80 MG tablet Please f/up

## 2016-10-22 ENCOUNTER — Other Ambulatory Visit: Payer: Self-pay | Admitting: Internal Medicine

## 2016-10-22 DIAGNOSIS — I25810 Atherosclerosis of coronary artery bypass graft(s) without angina pectoris: Secondary | ICD-10-CM

## 2016-10-22 MED FILL — CLOPIDOGREL 75 MG TABLET: 75 | 30 days supply | Qty: 30 | Fill #0

## 2016-10-22 MED FILL — ATORVASTATIN 80 MG TABLET: 80 | 30 days supply | Qty: 30 | Fill #2

## 2016-11-13 ENCOUNTER — Ambulatory Visit: Payer: Self-pay | Admitting: Internal Medicine

## 2016-11-24 MED FILL — ?CLOPIDOGREL 75MG TAB: 75 | 30 days supply | Qty: 30 | Fill #1

## 2016-12-01 ENCOUNTER — Encounter: Payer: Self-pay | Admitting: Internal Medicine

## 2016-12-01 ENCOUNTER — Ambulatory Visit: Payer: Self-pay | Attending: Internal Medicine | Admitting: Internal Medicine

## 2016-12-01 DIAGNOSIS — I739 Peripheral vascular disease, unspecified: Secondary | ICD-10-CM | POA: Insufficient documentation

## 2016-12-01 DIAGNOSIS — Z951 Presence of aortocoronary bypass graft: Secondary | ICD-10-CM | POA: Insufficient documentation

## 2016-12-01 DIAGNOSIS — Z888 Allergy status to other drugs, medicaments and biological substances status: Secondary | ICD-10-CM | POA: Insufficient documentation

## 2016-12-01 DIAGNOSIS — Z833 Family history of diabetes mellitus: Secondary | ICD-10-CM | POA: Insufficient documentation

## 2016-12-01 DIAGNOSIS — E785 Hyperlipidemia, unspecified: Secondary | ICD-10-CM | POA: Insufficient documentation

## 2016-12-01 DIAGNOSIS — K219 Gastro-esophageal reflux disease without esophagitis: Secondary | ICD-10-CM | POA: Insufficient documentation

## 2016-12-01 DIAGNOSIS — Z803 Family history of malignant neoplasm of breast: Secondary | ICD-10-CM | POA: Insufficient documentation

## 2016-12-01 DIAGNOSIS — Z955 Presence of coronary angioplasty implant and graft: Secondary | ICD-10-CM | POA: Insufficient documentation

## 2016-12-01 DIAGNOSIS — I25810 Atherosclerosis of coronary artery bypass graft(s) without angina pectoris: Secondary | ICD-10-CM | POA: Insufficient documentation

## 2016-12-01 DIAGNOSIS — I130 Hypertensive heart and chronic kidney disease with heart failure and stage 1 through stage 4 chronic kidney disease, or unspecified chronic kidney disease: Secondary | ICD-10-CM | POA: Insufficient documentation

## 2016-12-01 DIAGNOSIS — Z2821 Immunization not carried out because of patient refusal: Secondary | ICD-10-CM | POA: Insufficient documentation

## 2016-12-01 DIAGNOSIS — G4700 Insomnia, unspecified: Secondary | ICD-10-CM | POA: Insufficient documentation

## 2016-12-01 DIAGNOSIS — N183 Chronic kidney disease, stage 3 (moderate): Secondary | ICD-10-CM | POA: Insufficient documentation

## 2016-12-01 DIAGNOSIS — Z8249 Family history of ischemic heart disease and other diseases of the circulatory system: Secondary | ICD-10-CM | POA: Insufficient documentation

## 2016-12-01 DIAGNOSIS — Z6833 Body mass index (BMI) 33.0-33.9, adult: Secondary | ICD-10-CM | POA: Insufficient documentation

## 2016-12-01 DIAGNOSIS — I252 Old myocardial infarction: Secondary | ICD-10-CM | POA: Insufficient documentation

## 2016-12-01 DIAGNOSIS — Z7902 Long term (current) use of antithrombotics/antiplatelets: Secondary | ICD-10-CM | POA: Insufficient documentation

## 2016-12-01 DIAGNOSIS — Z7982 Long term (current) use of aspirin: Secondary | ICD-10-CM | POA: Insufficient documentation

## 2016-12-01 DIAGNOSIS — Z7951 Long term (current) use of inhaled steroids: Secondary | ICD-10-CM | POA: Insufficient documentation

## 2016-12-01 DIAGNOSIS — I5032 Chronic diastolic (congestive) heart failure: Secondary | ICD-10-CM | POA: Insufficient documentation

## 2016-12-01 DIAGNOSIS — E669 Obesity, unspecified: Secondary | ICD-10-CM | POA: Insufficient documentation

## 2016-12-01 DIAGNOSIS — Z885 Allergy status to narcotic agent status: Secondary | ICD-10-CM | POA: Insufficient documentation

## 2016-12-01 DIAGNOSIS — Z87891 Personal history of nicotine dependence: Secondary | ICD-10-CM | POA: Insufficient documentation

## 2016-12-01 DIAGNOSIS — Z90710 Acquired absence of both cervix and uterus: Secondary | ICD-10-CM | POA: Insufficient documentation

## 2016-12-01 DIAGNOSIS — I10 Essential (primary) hypertension: Secondary | ICD-10-CM

## 2016-12-01 DIAGNOSIS — Z9889 Other specified postprocedural states: Secondary | ICD-10-CM | POA: Insufficient documentation

## 2016-12-01 DIAGNOSIS — Z9049 Acquired absence of other specified parts of digestive tract: Secondary | ICD-10-CM | POA: Insufficient documentation

## 2016-12-01 DIAGNOSIS — Z800 Family history of malignant neoplasm of digestive organs: Secondary | ICD-10-CM | POA: Insufficient documentation

## 2016-12-01 DIAGNOSIS — J449 Chronic obstructive pulmonary disease, unspecified: Secondary | ICD-10-CM | POA: Insufficient documentation

## 2016-12-01 MED ORDER — CARVEDILOL 12.5 MG PO TABS: 12.5000 mg | ORAL_TABLET | Freq: Two times a day (BID) | ORAL | 6 refills | Status: DC

## 2016-12-01 MED ORDER — TRAZODONE HCL 50 MG PO TABS: 25.0000 mg | ORAL_TABLET | Freq: Every evening | ORAL | 6 refills | Status: DC | PRN

## 2016-12-01 MED ORDER — CLONIDINE HCL 0.1 MG PO TABS: 0.1000 mg | ORAL_TABLET | Freq: Two times a day (BID) | ORAL | 3 refills | Status: DC

## 2016-12-01 MED ORDER — ATORVASTATIN CALCIUM 80 MG PO TABS: 80.0000 mg | ORAL_TABLET | Freq: Every day | ORAL | 6 refills | Status: DC

## 2016-12-01 MED ORDER — CLONIDINE HCL 0.2 MG PO TABS
0.2000 mg | ORAL_TABLET | Freq: Every day | ORAL | Status: AC
  Administered 2016-12-01: 0.2 mg via ORAL

## 2016-12-01 MED ORDER — MOMETASONE FURO-FORMOTEROL FUM 100-5 MCG/ACT IN AERO: 2.0000 | INHALATION_SPRAY | Freq: Two times a day (BID) | RESPIRATORY_TRACT | 3 refills | Status: DC

## 2016-12-01 MED ORDER — FUROSEMIDE 40 MG PO TABS: 40.0000 mg | ORAL_TABLET | Freq: Every day | ORAL | 6 refills | Status: DC

## 2016-12-01 NOTE — Patient Instructions (Addendum)
Give follow-up appointment to see [name] in 1 week for blood pressure check and titration of medication.  Please take carvedilol twice a day as discussed. Start clonidine. Try to limit salt in the food as much as possible.

## 2016-12-01 NOTE — Progress Notes (Signed)
Patient ID: [name], female    DOB: July 15, 1950  MRN: [id number]  CC: re-establish; Medication Refill; Hypertension; and Weight Gain   Subjective: [name] is a 65 y.o. female who presents for chronic ds management Her concerns today include: Patient with history of severe hypertension, CAD s/p CABG, CKD stage III, COPD, insomnia, chronic diastolic CHF  On limited income.   1. CAD/HTN/CHF:  -taking Coreg once a day. BID makes her feel "goofy."  Also tried with Metoprolol in past. Can not tolerate ACE-I due to cough. She stopped Losartan because she did not like the way it made her feel.  Does not like taking medications and has had hard times in the past tolerating them -does not check BP -uses [employer]. "Most of the foods that I can afford are loaded with sodium." -no CP. Sleeps on 2 pillows, No PND. + LE edema which is chronic. Takes Furosemide faithfully.  -Requesting refills on all meds  2. COPD:  Currently getting over a cold. Endorses frequent wheezing however she does not take her Dulera inhaler consistently for no apparent reason.  3.CKD:  Not on any NSAIDs  Patient Active Problem List   Diagnosis Date Noted  . Insomnia 07/19/2016  . CKD (chronic kidney disease) stage 3, GFR 30-59 ml/min (HCC) 06/13/2015  . Chronic diastolic congestive heart failure (HCC) 06/07/2015  . COPD (chronic obstructive pulmonary disease) (HCC) 06/07/2015  . Obesity (BMI 30.0-34.9) 06/07/2015  . Coronary atherosclerosis of autologous vein bypass graft 05/11/2013  . Essential hypertension 04/20/2013  . ST elevation myocardial infarction (STEMI) of lateral wall (HCC) 09/29/2012    Class: Acute  . Cerebrovascular disease 11/30/2009  . Peripheral vascular disease of lower extremity (HCC) 11/30/2009  . Hyperlipidemia 10/19/2009  . GASTROESOPHAGEAL REFLUX DISEASE 10/19/2009  . DIVERTICULAR DISEASE 10/19/2009  . History of inferior wall myocardial infarction 10/18/2009     Current  Outpatient Prescriptions on File Prior to Visit  Medication Sig Dispense Refill  . aspirin 81 MG tablet Take 1 tablet (81 mg total) by mouth daily. Reported on 06/07/2015 90 tablet 3  . clopidogrel (PLAVIX) 75 MG tablet TAKE 1 TABLET BY MOUTH DAILY WITH BREAKFAST. 30 tablet 0  . nitroGLYCERIN (NITROSTAT) 0.4 MG SL tablet Place 1 tablet (0.4 mg total) under the tongue every 5 (five) minutes x 3 doses as needed for chest pain. 25 tablet 3   No current facility-administered medications on file prior to visit.     Allergies  Allergen Reactions  . Ace Inhibitors Cough  . Codeine Itching    welts  . Morphine Itching    Welts     Social History   Social History  . Marital status: Single    Spouse name: N/A  . Number of children: N/A  . Years of education: N/A   Occupational History  . Technician at [employer]   Social History Main Topics  . Smoking status: Former Smoker    Packs/day: 0.50  . Smokeless tobacco: Former Neurosurgeon    Quit date: 04/17/2013     Comment: 1/2 pack per day since 2005, 40 pack years  . Alcohol use No  . Drug use: No  . Sexual activity: Yes   Other Topics Concern  . Not on file   Social History Narrative   Divorced, no children    Family History  Problem Relation Age of Onset  . Pneumonia Mother   . Coronary artery disease Father   .  Heart disease Sister   . Colon cancer Maternal Aunt        Great aunt, rectal  . Diabetes Maternal Uncle   . Heart disease Maternal Uncle   . Breast cancer Maternal Grandmother   . Heart disease Other        Father's side of family    Past Surgical History:  Procedure Laterality Date  . ABDOMINAL HYSTERECTOMY    . CAROTID ENDARTERECTOMY     Right  . CHOLECYSTECTOMY    . COLONOSCOPY    . CORONARY ARTERY BYPASS GRAFT  2006   RIMA to the RCA, LIMA to the LAD, SVG to the circumflex  . HEMORRHOID SURGERY    . LEFT HEART CATH Bilateral 04/17/2013   Procedure: LEFT HEART CATH;   Surgeon: Runell GessJonathan J Berry, MD;  [facility];  Service: Cardiovascular;  Laterality: Bilateral;  . LEFT HEART CATHETERIZATION WITH CORONARY ANGIOGRAM N/A 09/27/2012   Procedure: LEFT HEART CATHETERIZATION WITH CORONARY ANGIOGRAM;  Surgeon: Runell GessJonathan J Berry, MD;  [facility];  Service: Cardiovascular;  Laterality: N/A;    ROS: Review of Systems Negative except as stated above PHYSICAL EXAM: BP (!) 188/98 (BP Location: Right Arm, Cuff Size: Large)   Pulse 70   Temp 98.1 F (36.7 C) (Oral)   Resp 16   Wt 224 lb 6.4 oz (101.8 kg)   SpO2 95%   BMI 33.62 kg/m   Wt Readings from Last 3 Encounters:  12/01/16 224 lb 6.4 oz (101.8 kg)  07/18/16 226 lb 12.8 oz (102.9 kg)  11/21/15 217 lb (98.4 kg)    Repeat blood pressure 200/120 Physical Exam General appearance - alert, well appearing, older Caucasian female and in no distress Mental status - alert, oriented to person, place, and time, normal mood, behavior, speech, dress, motor activity, and thought processes Mouth - mucous membranes moist, pharynx normal without lesions Neck - supple, no significant adenopathy Chest - clear to auscultation, no wheezes, rales or rhonchi, symmetric air entry Heart - normal rate, regular rhythm, normal S1, S2, no murmurs, rubs, clicks or gallops Extremities - mild nonpitting edema of both lower extremities  [facility] 07/18/2016 11/21/2015 06/07/2015 05/04/2013  Decreased Interest 1 0 0 0  Down, Depressed, Hopeless 0 1 0 0  PHQ - 2 Score 1 1 0 0  Altered sleeping - 3 3 -  Tired, decreased energy - 3 3 -  Change in appetite - 0 0 -  Feeling bad or failure about yourself  - 0 0 -  Trouble concentrating - 0 0 -  Moving slowly or fidgety/restless - 0 0 -  Suicidal thoughts - 0 0 -  PHQ-9 Score - 7 6 -    ASSESSMENT AND PLAN: 1. 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 - carvedilol (COREG) 12.5 MG tablet; Take 1  tablet (12.5 mg total) by mouth 2 (two) times daily with a meal.  Dispense: 60 tablet; Refill: 6 - atorvastatin (LIPITOR) 80 MG tablet; Take 1 tablet (80 mg total) by mouth daily at 6 PM.  Dispense: 30 tablet; Refill: 6  2. Essential hypertension -We discussed medications and the importance of good blood pressure control to prevent other cardiovascular events including MI and strokes. -She has been intolerant of several blood pressure medications. -She will commit to taking carvedilol twice a day as prescribed We spoke about adding on Norvasc or clonidine. She prefers the latter stating that blood pressure came down  nicely when she was given a dose in the office on last visit -Given one-time dose of clonidine 0.2 mg today. Start clonidine 0.1 mg twice a day -Follow up in one week with clinical pharmacists for repeat blood pressure check. If still elevated may consider increasing clonidine if she has tolerated it or change to Norvasc - cloNIDine (CATAPRES) 0.1 MG tablet; Take 1 tablet (0.1 mg total) by mouth 2 (two) times daily.  Dispense: 60 tablet; Refill: 3 - carvedilol (COREG) 12.5 MG tablet; Take 1 tablet (12.5 mg total) by mouth 2 (two) times daily with a meal.  Dispense: 60 tablet; Refill: 6 - furosemide (LASIX) 40 MG tablet; Take 1 tablet (40 mg total) by mouth daily.  Dispense: 30 tablet; Refill: 6 - cloNIDine (CATAPRES) tablet 0.2 mg; Take 1 tablet (0.2 mg total) by mouth daily.  3. Chronic diastolic congestive heart failure (HCC) -- carvedilol (COREG) 12.5 MG tablet; Take 1 tablet (12.5 mg total) by mouth 2 (two) times daily with a meal.  Dispense: 60 tablet; Refill: 6 - furosemide (LASIX) 40 MG tablet; Take 1 tablet (40 mg total) by mouth daily.  Dispense: 30 tablet; Refill: 6  4. Chronic obstructive pulmonary disease, unspecified COPD type (HCC) -Advised and encouraged twice a day use of to let her - mometasone-formoterol (DULERA) 100-5 MCG/ACT AERO; Inhale 2 puffs into the lungs 2  (two) times daily.  Dispense: 3 Inhaler; Refill: 3  5. Insomnia, unspecified type - traZODone (DESYREL) 50 MG tablet; Take 0.5-1 tablets (25-50 mg total) by mouth at bedtime as needed for sleep.  Dispense: 30 tablet; Refill: 6  6. Hyperlipidemia, unspecified hyperlipidemia type - atorvastatin (LIPITOR) 80 MG tablet; Take 1 tablet (80 mg total) by mouth daily at 6 PM.  Dispense: 30 tablet; Refill: 6  Patient declined having blood work done today for CBC and lipid profile. She also declined having present 13, flu vaccine and Tdap Patient was given the opportunity to ask questions.  Patient verbalized understanding of the plan and was able to repeat key elements of the plan.   No orders of the defined types were placed in this encounter.    Requested Prescriptions   Signed Prescriptions Disp Refills  . cloNIDine (CATAPRES) 0.1 MG tablet 60 tablet 3    Sig: Take 1 tablet (0.1 mg total) by mouth 2 (two) times daily.  . carvedilol (COREG) 12.5 MG tablet 60 tablet 6    Sig: Take 1 tablet (12.5 mg total) by mouth 2 (two) times daily with a meal.  . mometasone-formoterol (DULERA) 100-5 MCG/ACT AERO 3 Inhaler 3    Sig: Inhale 2 puffs into the lungs 2 (two) times daily.  . traZODone (DESYREL) 50 MG tablet 30 tablet 6    Sig: Take 0.5-1 tablets (25-50 mg total) by mouth at bedtime as needed for sleep.  . furosemide (LASIX) 40 MG tablet 30 tablet 6    Sig: Take 1 tablet (40 mg total) by mouth daily.  Marland Kitchen atorvastatin (LIPITOR) 80 MG tablet 30 tablet 6    Sig: Take 1 tablet (80 mg total) by mouth daily at 6 PM.    Return in about 2 months (around 01/31/2017).  Jonah Blue, MD, FACP

## 2016-12-05 MED FILL — cloNIDine HCL 0.1 MG TABS: 0.1 | 30 days supply | Qty: 60 | Fill #0 | Status: TO

## 2016-12-05 MED FILL — ATORVASTATIN 80 MG TABLET: 80 | 30 days supply | Qty: 30 | Fill #0

## 2016-12-16 ENCOUNTER — Ambulatory Visit: Payer: Medicare Other | Admitting: Pharmacist

## 2016-12-29 ENCOUNTER — Other Ambulatory Visit: Payer: Self-pay | Admitting: Internal Medicine

## 2016-12-29 DIAGNOSIS — I25810 Atherosclerosis of coronary artery bypass graft(s) without angina pectoris: Secondary | ICD-10-CM

## 2016-12-29 MED FILL — ?CLOPIDOGREL 75MG TAB: 75 | 30 days supply | Qty: 30 | Fill #0

## 2016-12-29 MED FILL — ?CARVEDILOL 12.5 MG TABLET: 12.5 | 30 days supply | Qty: 60 | Fill #0

## 2016-12-29 MED FILL — traZODone HCL 50 MG TABS: 50 | 30 days supply | Qty: 30 | Fill #1

## 2017-01-29 ENCOUNTER — Ambulatory Visit: Payer: Medicare Other | Admitting: Internal Medicine

## 2017-02-04 ENCOUNTER — Other Ambulatory Visit: Payer: Self-pay | Admitting: Internal Medicine

## 2017-02-04 DIAGNOSIS — I25810 Atherosclerosis of coronary artery bypass graft(s) without angina pectoris: Secondary | ICD-10-CM

## 2017-02-06 ENCOUNTER — Ambulatory Visit: Payer: Medicare Other | Admitting: Internal Medicine

## 2017-02-12 MED FILL — ?CARVEDILOL 12.5 MG TABLET: 12.5 | 30 days supply | Qty: 60 | Fill #1

## 2017-02-12 MED FILL — ?CLOPIDOGREL 75MG TABLET: 75 | 30 days supply | Qty: 30 | Fill #0

## 2017-02-12 MED FILL — ATORVASTATIN 80 MG TABLET: 80 | 30 days supply | Qty: 30 | Fill #1

## 2017-03-13 ENCOUNTER — Other Ambulatory Visit: Payer: Self-pay | Admitting: Internal Medicine

## 2017-03-13 DIAGNOSIS — I25810 Atherosclerosis of coronary artery bypass graft(s) without angina pectoris: Secondary | ICD-10-CM

## 2017-03-13 MED FILL — ?CLOPIDOGREL 75MG TA: 75 | 30 days supply | Qty: 30 | Fill #0

## 2017-03-13 MED FILL — ?CARVEDILOL 12.5 MG TABLET: 12.5 | 30 days supply | Qty: 60 | Fill #2

## 2017-03-13 MED FILL — ?FUROSEMIDE 40 MG TABLET: 40 | 30 days supply | Qty: 30 | Fill #0 | Status: TO

## 2017-03-13 MED FILL — ATORVASTATIN 80 MG TABLET: 80 | 30 days supply | Qty: 30 | Fill #2

## 2017-04-01 ENCOUNTER — Ambulatory Visit: Payer: Medicare Other | Admitting: Nurse Practitioner

## 2017-04-10 ENCOUNTER — Ambulatory Visit: Payer: Medicare Other

## 2017-04-15 ENCOUNTER — Other Ambulatory Visit: Payer: Self-pay | Admitting: Internal Medicine

## 2017-04-15 DIAGNOSIS — I25810 Atherosclerosis of coronary artery bypass graft(s) without angina pectoris: Secondary | ICD-10-CM

## 2017-04-15 MED FILL — ATORVASTATIN 80 MG TABLET: 80 | 30 days supply | Qty: 30 | Fill #3

## 2017-04-15 MED FILL — ?CARVEDILOL 12.5 MG TABLET: 12.5 | 30 days supply | Qty: 60 | Fill #3

## 2017-04-21 ENCOUNTER — Other Ambulatory Visit: Payer: Self-pay | Admitting: Internal Medicine

## 2017-04-21 DIAGNOSIS — I25810 Atherosclerosis of coronary artery bypass graft(s) without angina pectoris: Secondary | ICD-10-CM

## 2017-04-22 ENCOUNTER — Ambulatory Visit: Payer: Medicaid Other | Attending: Nurse Practitioner | Admitting: Nurse Practitioner

## 2017-04-22 ENCOUNTER — Encounter: Payer: Self-pay | Admitting: Nurse Practitioner

## 2017-04-22 VITALS — BP 184/107 | HR 56 | Temp 98.8°F | Ht 68.0 in | Wt 226.2 lb

## 2017-04-22 DIAGNOSIS — Z7902 Long term (current) use of antithrombotics/antiplatelets: Secondary | ICD-10-CM | POA: Insufficient documentation

## 2017-04-22 DIAGNOSIS — Z9049 Acquired absence of other specified parts of digestive tract: Secondary | ICD-10-CM | POA: Insufficient documentation

## 2017-04-22 DIAGNOSIS — J449 Chronic obstructive pulmonary disease, unspecified: Secondary | ICD-10-CM | POA: Diagnosis not present

## 2017-04-22 DIAGNOSIS — Z7951 Long term (current) use of inhaled steroids: Secondary | ICD-10-CM | POA: Diagnosis not present

## 2017-04-22 DIAGNOSIS — Z90710 Acquired absence of both cervix and uterus: Secondary | ICD-10-CM | POA: Diagnosis not present

## 2017-04-22 DIAGNOSIS — Z7982 Long term (current) use of aspirin: Secondary | ICD-10-CM | POA: Diagnosis not present

## 2017-04-22 DIAGNOSIS — I25810 Atherosclerosis of coronary artery bypass graft(s) without angina pectoris: Secondary | ICD-10-CM

## 2017-04-22 DIAGNOSIS — I110 Hypertensive heart disease with heart failure: Secondary | ICD-10-CM | POA: Diagnosis not present

## 2017-04-22 DIAGNOSIS — K210 Gastro-esophageal reflux disease with esophagitis: Secondary | ICD-10-CM | POA: Insufficient documentation

## 2017-04-22 DIAGNOSIS — Z8619 Personal history of other infectious and parasitic diseases: Secondary | ICD-10-CM | POA: Insufficient documentation

## 2017-04-22 DIAGNOSIS — Z885 Allergy status to narcotic agent status: Secondary | ICD-10-CM | POA: Diagnosis not present

## 2017-04-22 DIAGNOSIS — K219 Gastro-esophageal reflux disease without esophagitis: Secondary | ICD-10-CM

## 2017-04-22 DIAGNOSIS — Z833 Family history of diabetes mellitus: Secondary | ICD-10-CM | POA: Insufficient documentation

## 2017-04-22 DIAGNOSIS — Z9889 Other specified postprocedural states: Secondary | ICD-10-CM | POA: Insufficient documentation

## 2017-04-22 DIAGNOSIS — Z888 Allergy status to other drugs, medicaments and biological substances status: Secondary | ICD-10-CM | POA: Insufficient documentation

## 2017-04-22 DIAGNOSIS — Z955 Presence of coronary angioplasty implant and graft: Secondary | ICD-10-CM | POA: Insufficient documentation

## 2017-04-22 DIAGNOSIS — Z8249 Family history of ischemic heart disease and other diseases of the circulatory system: Secondary | ICD-10-CM | POA: Diagnosis not present

## 2017-04-22 DIAGNOSIS — G4700 Insomnia, unspecified: Secondary | ICD-10-CM | POA: Diagnosis not present

## 2017-04-22 DIAGNOSIS — I739 Peripheral vascular disease, unspecified: Secondary | ICD-10-CM | POA: Diagnosis not present

## 2017-04-22 DIAGNOSIS — M1990 Unspecified osteoarthritis, unspecified site: Secondary | ICD-10-CM | POA: Diagnosis not present

## 2017-04-22 DIAGNOSIS — I5032 Chronic diastolic (congestive) heart failure: Secondary | ICD-10-CM

## 2017-04-22 DIAGNOSIS — E785 Hyperlipidemia, unspecified: Secondary | ICD-10-CM | POA: Diagnosis not present

## 2017-04-22 DIAGNOSIS — Z79899 Other long term (current) drug therapy: Secondary | ICD-10-CM | POA: Diagnosis not present

## 2017-04-22 DIAGNOSIS — I10 Essential (primary) hypertension: Secondary | ICD-10-CM

## 2017-04-22 MED ORDER — CLONIDINE HCL 0.2 MG PO TABS: 0.2000 mg | ORAL_TABLET | Freq: Two times a day (BID) | ORAL | 6 refills | Status: DC

## 2017-04-22 MED ORDER — FUROSEMIDE 40 MG PO TABS: 40.0000 mg | ORAL_TABLET | Freq: Every day | ORAL | 6 refills | Status: DC

## 2017-04-22 MED ORDER — TRAZODONE HCL 100 MG PO TABS: 100.0000 mg | ORAL_TABLET | Freq: Every evening | ORAL | 1 refills | Status: DC | PRN

## 2017-04-22 MED ORDER — CLOPIDOGREL BISULFATE 75 MG PO TABS: 75.0000 mg | ORAL_TABLET | Freq: Every day | ORAL | 1 refills | Status: AC

## 2017-04-22 MED ORDER — ATORVASTATIN CALCIUM 80 MG PO TABS: 80.0000 mg | ORAL_TABLET | Freq: Every day | ORAL | 6 refills | Status: DC

## 2017-04-22 MED ORDER — MOMETASONE FURO-FORMOTEROL FUM 100-5 MCG/ACT IN AERO: 2.0000 | INHALATION_SPRAY | Freq: Two times a day (BID) | RESPIRATORY_TRACT | 11 refills | Status: DC

## 2017-04-22 MED ORDER — PANTOPRAZOLE SODIUM 40 MG PO TBEC: 40.0000 mg | DELAYED_RELEASE_TABLET | Freq: Every day | ORAL | 3 refills | Status: DC

## 2017-04-22 MED ORDER — CLONIDINE HCL 0.1 MG PO TABS
0.2000 mg | ORAL_TABLET | Freq: Once | ORAL | Status: AC
  Administered 2017-04-22: 0.2 mg via ORAL

## 2017-04-22 MED FILL — CLOPIDOGREL 75 MG TABLET: 75 | 30 days supply | Qty: 30 | Fill #0

## 2017-04-22 NOTE — Progress Notes (Addendum)
Assessment & Plan:  [name] was seen today for establish care and medication refill.  Diagnoses and all orders for this visit:  Essential hypertension -     cloNIDine (CATAPRES) tablet 0.2 mg -     CMP14+EGFR -     Lipid panel -     furosemide (LASIX) 40 MG tablet; Take 1 tablet (40 mg total) by mouth daily. -     cloNIDine (CATAPRES) 0.2 MG tablet; Take 1 tablet (0.2 mg total) by mouth 2 (two) times daily.  Continue all antihypertensives as prescribed.  Remember to bring in your blood pressure log with you for your follow up appointment.  DASH/Mediterranean Diets are healthier choices for HTN.  Patient was instructed to monitor her blood pressure and heart rate with monitor at home. If HR consistently <55 she needs to call the office for additional instructions.    Chronic diastolic congestive heart failure (HCC) -     furosemide (LASIX) 40 MG tablet; Take 1 tablet (40 mg total) by mouth daily. DASH DIET  Chronic obstructive pulmonary disease, unspecified COPD type ([place]) -     mometasone-formoterol (DULERA) 100-5 MCG/ACT AERO; Inhale 2 puffs into the lungs 2 (two) times daily.  Peripheral vascular disease of lower extremity (HCC) Continue daily ASA as prescribed.   Coronary atherosclerosis of autologous vein bypass graft without angina -     clopidogrel (PLAVIX) 75 MG tablet; Take 1 tablet (75 mg total) by mouth daily with breakfast. -     atorvastatin (LIPITOR) 80 MG tablet; Take 1 tablet (80 mg total) by mouth daily at 6 PM.  Chronic obstructive pulmonary disease, unspecified COPD type ([place]) -     mometasone-formoterol (DULERA) 100-5 MCG/ACT AERO; Inhale 2 puffs into the lungs 2 (two) times daily.  Insomnia, unspecified type -     traZODone (DESYREL) 100 MG tablet; Take 1 tablet (100 mg total) by mouth at bedtime as needed for sleep.  Hyperlipidemia, unspecified hyperlipidemia type -     atorvastatin (LIPITOR) 80 MG tablet; Take 1 tablet (80 mg total) by mouth daily at 6  PM. Work on a low fat, heart healthy diet and participate in regular aerobic exercise program to control as well by working out at least 150 minutes per week. No fried foods. No junk foods, sodas, sugary drinks, unhealthy snacking, or smoking.   Gastroesophageal reflux disease, esophagitis presence not specified -     pantoprazole (PROTONIX) 40 MG tablet; Take 1 tablet (40 mg total) by mouth daily.  INSTRUCTIONS: Avoid GERD Triggers: acidic, spicy or fried foods, caffeine, coffee, sodas,  alcohol and chocolate.   Patient has been counseled on age-appropriate routine health concerns for screening and prevention. These are reviewed and up-to-date. Referrals have been placed accordingly. Immunizations are up-to-date or declined.    Subjective:   Chief Complaint  Patient presents with  . Establish Care    Patient is here to establish care for hypertension.   . Medication Refill    Pt stated she need Plavix refill.    HPI [name] 67 y.o. female presents to office today to re establish care. She states she makes $900 a month and has trouble paying her bills and for her medication. She also takes her medications according to how she feels. She has a history of medication noncompliance. PMH includes:  history of CAD with CABG 2006, CHF, HTN,   Essential Hypertension Chronic.  Blood pressure is poorly controlled today and she required 0.2 mg of clonidine here  in the office.  I have increased her clonidine from 0.1 mg twice daily to 0.2 mg twice daily in addition to her current dose of carvedilol 12.5 mg twice daily.  She reports having an intolerance to ACE and Arbs. Denies chest pain, shortness of breath, palpitations, lightheadedness, dizziness, headaches or visual disturbances.  BP Readings from Last 3 Encounters:  04/22/17 (!) 184/107  12/01/16 (!) 188/98  07/18/16 (!) 158/100   GERD Chronic.  She reports little relief with taking Zantac in the past and states she was told not to take  omeprazole due to contraindications with Plavix.  She is currently taking Nexium which I instructed her can also reduce the effect of the effectiveness of Plavix.  Will try Protonix at this time.  CAD She has been out of her plavix for 3 days.   PVD with BLE edema (s/p right fem pop bypass/bilateral iliac stents) Chronic. Edema controlled with furosemide.   COPD Only uses Dulera as needed.  She is aware that this should be using it every day however she reports she does not need it every day.  I instructed her to use it as prescribed and that dulera helps to prevent any exacerbation of COPD.  She no longer smokes cigarettes.  Insomnia Endorses only minimal relief of insomnia taking trazodone 50 mg.  She has difficulty staying asleep but not falling asleep.  Will increase trazodone to 100 mg nightly.  Hyperlipidemia Endorses medication compliance taking atorvastatin 80 mg daily.  She denies any statin intolerance or myalgias.  LDL is not at goal.  Fasting lipid panel pending.  She is not diet or exercise compliant. Lab Results  Component Value Date   LDLCALC 145 (H) 06/07/2015   Review of Systems  Constitutional: Negative for fever, malaise/fatigue and weight loss.  HENT: Negative.  Negative for nosebleeds.   Eyes: Negative.  Negative for blurred vision, double vision and photophobia.  Respiratory: Negative.  Negative for cough and shortness of breath.   Cardiovascular: Positive for leg swelling. Negative for chest pain and palpitations.  Gastrointestinal: Positive for heartburn. Negative for nausea and vomiting.  Musculoskeletal: Positive for back pain. Negative for myalgias.  Neurological: Negative.  Negative for dizziness, focal weakness, seizures and headaches.  Psychiatric/Behavioral: Negative for suicidal ideas. The patient has insomnia.     Past Medical History:  Diagnosis Date  . ASCVD (arteriosclerotic cardiovascular disease)    CABG in 2006   . Cerebrovascular disease     . Coronary artery disease   . Diverticulitis   . DJD (degenerative joint disease) of cervical spine   . GERD (gastroesophageal reflux disease)   . H. pylori infection   . Hepatitis   . Hypertension   . Migraine headache   . PVD (peripheral vascular disease) ([place])   . Shingles 11/21/2015  . Tobacco abuse     Past Surgical History:  Procedure Laterality Date  . ABDOMINAL HYSTERECTOMY    . CAROTID ENDARTERECTOMY     Right  . CHOLECYSTECTOMY    . COLONOSCOPY    . CORONARY ARTERY BYPASS GRAFT  2006   RIMA to the RCA, LIMA to the LAD, SVG to the circumflex  . HEMORRHOID SURGERY    . LEFT HEART CATH Bilateral 04/17/2013   Procedure: LEFT HEART CATH;  Surgeon: Lorretta Harp, MD;  Location: [facility] CATH LAB;  Service: Cardiovascular;  Laterality: Bilateral;  . LEFT HEART CATHETERIZATION WITH CORONARY ANGIOGRAM N/A 09/27/2012   Procedure: LEFT HEART CATHETERIZATION WITH CORONARY ANGIOGRAM;  Surgeon:  Lorretta Harp, MD;  Location[facility] CATH LAB;  Service: Cardiovascular;  Laterality: N/A;    Family History  Problem Relation Age of Onset  . Pneumonia Mother   . Coronary artery disease Father   . Heart disease Sister   . Colon cancer Maternal Aunt        Great aunt, rectal  . Diabetes Maternal Uncle   . Heart disease Maternal Uncle   . Breast cancer Maternal Grandmother   . Heart disease Other        Father's side of family    Social History Reviewed with no changes to be made today.   Outpatient Medications Prior to Visit  Medication Sig Dispense Refill  . aspirin 81 MG tablet Take 1 tablet (81 mg total) by mouth daily. Reported on 06/07/2015 90 tablet 3  . carvedilol (COREG) 12.5 MG tablet Take 1 tablet (12.5 mg total) by mouth 2 (two) times daily with a meal. 60 tablet 6  . nitroGLYCERIN (NITROSTAT) 0.4 MG SL tablet Place 1 tablet (0.4 mg total) under the tongue every 5 (five) minutes x 3 doses as needed for chest pain. 25 tablet 3  . atorvastatin (LIPITOR) 80 MG tablet Take 1  tablet (80 mg total) by mouth daily at 6 PM. 30 tablet 6  . cloNIDine (CATAPRES) 0.1 MG tablet Take 1 tablet (0.1 mg total) by mouth 2 (two) times daily. 60 tablet 3  . clopidogrel (PLAVIX) 75 MG tablet TAKE 1 TABLET BY MOUTH DAILY WITH BREAKFAST. 30 tablet 0  . furosemide (LASIX) 40 MG tablet Take 1 tablet (40 mg total) by mouth daily. 30 tablet 6  . mometasone-formoterol (DULERA) 100-5 MCG/ACT AERO Inhale 2 puffs into the lungs 2 (two) times daily. 3 Inhaler 3  . traZODone (DESYREL) 50 MG tablet Take 0.5-1 tablets (25-50 mg total) by mouth at bedtime as needed for sleep. 30 tablet 6   No facility-administered medications prior to visit.     Allergies  Allergen Reactions  . Ace Inhibitors Cough  . Codeine Itching    welts  . Morphine Itching    Welts        Objective:    BP (!) 184/107 (BP Location: Right Arm, Cuff Size: Large)   Pulse (!) 56   Temp 98.8 F (37.1 C) (Oral)   Ht 5' 8"  (1.727 m)   Wt 226 lb 3.2 oz (102.6 kg)   BMI 34.39 kg/m  Wt Readings from Last 3 Encounters:  04/22/17 226 lb 3.2 oz (102.6 kg)  12/01/16 224 lb 6.4 oz (101.8 kg)  07/18/16 226 lb 12.8 oz (102.9 kg)    Physical Exam  Constitutional: She is oriented to person, place, and time. She appears well-developed and well-nourished. She is cooperative.  HENT:  Head: Normocephalic and atraumatic.  Eyes: EOM are normal.  Neck: Normal range of motion.  Cardiovascular: Regular rhythm, normal heart sounds and intact distal pulses. Bradycardia present. Exam reveals no gallop and no friction rub.  No murmur heard. Pulmonary/Chest: Effort normal and breath sounds normal. No tachypnea. No respiratory distress. She has no decreased breath sounds. She has no wheezes. She has no rhonchi. She has no rales. She exhibits no tenderness.  Abdominal: Soft. Bowel sounds are normal.  Musculoskeletal: Normal range of motion. She exhibits no edema.  Neurological: She is alert and oriented to person, place, and time.  Coordination normal.  Skin: Skin is warm and dry.  Psychiatric: She has a normal mood and affect. Her behavior is normal.  Judgment and thought content normal.  Nursing note and vitals reviewed.      Patient has been counseled extensively about nutrition and exercise as well as the importance of adherence with medications and regular follow-up. The patient was given clear instructions to go to ER or return to medical center if symptoms don't improve, worsen or new problems develop. The patient verbalized understanding.   Follow-up: Return in about 3 months (around 07/23/2017) for HTN/copd/CHF, Needs appointment with financial representative.Gildardo Pounds, FNP-BC [facility] [place], [place]   04/22/2017, 10:51 PM

## 2017-04-22 NOTE — Patient Instructions (Signed)

## 2017-04-23 LAB — LIPID PANEL
CHOL/HDL RATIO: 3.6 ratio (ref 0.0–4.4)
Cholesterol, Total: 155 mg/dL (ref 100–199)
HDL: 43 mg/dL (ref >39)
LDL Calculated: 61 mg/dL (ref 0–99)
TRIGLYCERIDES: 256 mg/dL — AB (ref 0–149)
VLDL Cholesterol Cal: 51 mg/dL — ABNORMAL HIGH (ref 5–40)

## 2017-04-23 LAB — CMP14+EGFR
A/G RATIO: 1.8 (ref 1.2–2.2)
ALT: 11 IU/L (ref 0–32)
AST: 14 IU/L (ref 0–40)
Albumin: 4.1 g/dL (ref 3.6–4.8)
Alkaline Phosphatase: 106 IU/L (ref 39–117)
BUN/Creatinine Ratio: 11 — ABNORMAL LOW (ref 12–28)
BUN: 14 mg/dL (ref 8–27)
Bilirubin Total: 0.3 mg/dL (ref 0.0–1.2)
CALCIUM: 8.8 mg/dL (ref 8.7–10.3)
CO2: 20 mmol/L (ref 20–29)
Chloride: 106 mmol/L (ref 96–106)
Creatinine, Ser: 1.24 mg/dL — ABNORMAL HIGH (ref 0.57–1.00)
GFR calc Af Amer: 52 mL/min/{1.73_m2} — ABNORMAL LOW (ref >59)
GFR, EST NON AFRICAN AMERICAN: 45 mL/min/{1.73_m2} — AB (ref >59)
GLOBULIN, TOTAL: 2.3 g/dL (ref 1.5–4.5)
Glucose: 96 mg/dL (ref 65–99)
POTASSIUM: 4.7 mmol/L (ref 3.5–5.2)
SODIUM: 145 mmol/L — AB (ref 134–144)
Total Protein: 6.4 g/dL (ref 6.0–8.5)

## 2017-05-18 MED FILL — ?CARVEDILOL 12.5 MG TABLET: 12.5 | 30 days supply | Qty: 60 | Fill #4

## 2017-05-18 MED FILL — PANTOPRAZOLE SOD DR 40 MG T: 40 | 30 days supply | Qty: 30 | Fill #0 | Status: TO

## 2017-05-18 MED FILL — ATORVASTATIN 80 MG TABLET: 80 | 30 days supply | Qty: 30 | Fill #4

## 2017-05-18 MED FILL — traZODone HCL 100 MG TABS: 100 | 30 days supply | Qty: 30 | Fill #0 | Status: TO

## 2017-05-18 MED FILL — CLOPIDOGREL 75 MG TABLET: 75 | 30 days supply | Qty: 30 | Fill #0

## 2017-05-27 ENCOUNTER — Ambulatory Visit: Payer: Self-pay | Attending: Nurse Practitioner

## 2017-05-27 ENCOUNTER — Telehealth: Payer: Self-pay | Admitting: Nurse Practitioner

## 2017-05-27 NOTE — Telephone Encounter (Signed)
Pt came in to leave a Handicap place card in the front desk and it was given to the nurse. Pt was told that the nurse will call her when it is ready for pickup.

## 2017-05-28 NOTE — Telephone Encounter (Signed)
Noted. Will call patient when ready.

## 2017-06-03 NOTE — Telephone Encounter (Signed)
CMA attempt to call patient to inform her Handicap place card is ready for her to pick up in the office. No answer and left a VM for patient.

## 2017-06-17 MED FILL — CARVEDILOL 12.5 MG TABLET: 12.5 | 30 days supply | Qty: 60 | Fill #5

## 2017-06-17 MED FILL — CLOPIDOGREL 75 MG TABLET: 75 | 30 days supply | Qty: 30 | Fill #1

## 2017-06-17 MED FILL — ATORVASTATIN 80 MG TABLET: 80 | 30 days supply | Qty: 30 | Fill #5

## 2017-07-24 ENCOUNTER — Ambulatory Visit: Payer: Self-pay | Admitting: Nurse Practitioner

## 2017-07-28 MED FILL — ATORVASTATIN 80 MG TABLET: 80 | 30 days supply | Qty: 30 | Fill #6

## 2017-07-28 MED FILL — CARVEDILOL 12.5 MG TABLET: 12.5 | 30 days supply | Qty: 60 | Fill #6

## 2017-07-28 MED FILL — CLOPIDOGREL 75 MG TABLET: 75 | 30 days supply | Qty: 30 | Fill #2 | Status: TO

## 2017-07-28 MED FILL — FUROSEMIDE 40 MG TAB: 40 | 30 days supply | Qty: 30 | Fill #0 | Status: TO

## 2017-08-12 ENCOUNTER — Ambulatory Visit: Payer: Medicaid Other | Attending: Nurse Practitioner | Admitting: Nurse Practitioner

## 2017-08-12 ENCOUNTER — Encounter: Payer: Self-pay | Admitting: Nurse Practitioner

## 2017-08-12 VITALS — BP 172/99 | HR 63 | Temp 98.6°F | Resp 14 | Ht 68.0 in | Wt 226.4 lb

## 2017-08-12 DIAGNOSIS — Z888 Allergy status to other drugs, medicaments and biological substances status: Secondary | ICD-10-CM | POA: Insufficient documentation

## 2017-08-12 DIAGNOSIS — Z8249 Family history of ischemic heart disease and other diseases of the circulatory system: Secondary | ICD-10-CM | POA: Insufficient documentation

## 2017-08-12 DIAGNOSIS — J449 Chronic obstructive pulmonary disease, unspecified: Secondary | ICD-10-CM | POA: Diagnosis not present

## 2017-08-12 DIAGNOSIS — Z780 Asymptomatic menopausal state: Secondary | ICD-10-CM

## 2017-08-12 DIAGNOSIS — Z79899 Other long term (current) drug therapy: Secondary | ICD-10-CM | POA: Diagnosis not present

## 2017-08-12 DIAGNOSIS — Z9049 Acquired absence of other specified parts of digestive tract: Secondary | ICD-10-CM | POA: Insufficient documentation

## 2017-08-12 DIAGNOSIS — G4700 Insomnia, unspecified: Secondary | ICD-10-CM

## 2017-08-12 DIAGNOSIS — M47812 Spondylosis without myelopathy or radiculopathy, cervical region: Secondary | ICD-10-CM | POA: Insufficient documentation

## 2017-08-12 DIAGNOSIS — I2510 Atherosclerotic heart disease of native coronary artery without angina pectoris: Secondary | ICD-10-CM | POA: Diagnosis not present

## 2017-08-12 DIAGNOSIS — Z90710 Acquired absence of both cervix and uterus: Secondary | ICD-10-CM | POA: Diagnosis not present

## 2017-08-12 DIAGNOSIS — K219 Gastro-esophageal reflux disease without esophagitis: Secondary | ICD-10-CM | POA: Insufficient documentation

## 2017-08-12 DIAGNOSIS — I10 Essential (primary) hypertension: Secondary | ICD-10-CM

## 2017-08-12 DIAGNOSIS — Z7982 Long term (current) use of aspirin: Secondary | ICD-10-CM | POA: Insufficient documentation

## 2017-08-12 DIAGNOSIS — Z951 Presence of aortocoronary bypass graft: Secondary | ICD-10-CM | POA: Insufficient documentation

## 2017-08-12 DIAGNOSIS — I739 Peripheral vascular disease, unspecified: Secondary | ICD-10-CM | POA: Insufficient documentation

## 2017-08-12 DIAGNOSIS — K432 Incisional hernia without obstruction or gangrene: Secondary | ICD-10-CM

## 2017-08-12 DIAGNOSIS — E782 Mixed hyperlipidemia: Secondary | ICD-10-CM | POA: Diagnosis not present

## 2017-08-12 DIAGNOSIS — Z885 Allergy status to narcotic agent status: Secondary | ICD-10-CM | POA: Insufficient documentation

## 2017-08-12 DIAGNOSIS — Z7951 Long term (current) use of inhaled steroids: Secondary | ICD-10-CM | POA: Diagnosis not present

## 2017-08-12 DIAGNOSIS — Z9114 Patient's other noncompliance with medication regimen: Secondary | ICD-10-CM | POA: Insufficient documentation

## 2017-08-12 DIAGNOSIS — Z1231 Encounter for screening mammogram for malignant neoplasm of breast: Secondary | ICD-10-CM | POA: Diagnosis not present

## 2017-08-12 MED ORDER — TRAZODONE HCL 100 MG PO TABS: 100.0000 mg | ORAL_TABLET | Freq: Every evening | ORAL | 1 refills | Status: DC | PRN

## 2017-08-12 NOTE — Patient Instructions (Addendum)
Fasting labs at next office appointment in 6 weeks and BRING ALL YOUR MEDICATIONS!!! THANK YOU :)    Hernia A hernia happens when an organ or tissue inside your body pushes out through a weak spot in the belly (abdomen). Follow these instructions at home:  Avoid stretching or overusing (straining) the muscles near the hernia.  Do not lift anything heavier than 10 lb (4.5 kg).  Use the muscles in your leg when you lift something up. Do not use the muscles in your back.  When you cough, try to cough gently.  Eat a diet that has a lot of fiber. Eat lots of fruits and vegetables.  Drink enough fluids to keep your pee (urine) clear or pale yellow. Try to drink 6-8 glasses of water a day.  Take medicines to make your poop soft (stool softeners) as told by your doctor.  Lose weight, if you are overweight.  Do not use any tobacco products, including cigarettes, chewing tobacco, or electronic cigarettes. If you need help quitting, ask your doctor.  Keep all follow-up visits as told by your doctor. This is important. Contact a doctor if:  The skin by the hernia gets puffy (swollen) or red.  The hernia is painful. Get help right away if:  You have a fever.  You have belly pain that is getting worse.  You feel sick to your stomach (nauseous) or you throw up (vomit).  You cannot push the hernia back in place by gently pressing on it while you are lying down.  The hernia: ? Changes in shape or size. ? Is stuck outside your belly. ? Changes color. ? Feels hard or tender. This information is not intended to replace advice given to you by your health care provider. Make sure you discuss any questions you have with your health care provider. Document Released: 07/10/2009 Document Revised: 06/28/2015 Document Reviewed: 11/30/2013 Elsevier Interactive Patient Education  [employer].

## 2017-08-12 NOTE — Progress Notes (Signed)
Assessment & Plan:  [name] was seen today for follow-up.  Diagnoses and all orders for this visit:  Essential hypertension Continue all antihypertensives as prescribed.  Remember to bring in your blood pressure log with you for your follow up appointment.  DASH/Mediterranean Diets are healthier choices for HTN.    Insomnia, unspecified type -     traZODone (DESYREL) 100 MG tablet; Take 1 tablet (100 mg total) by mouth at bedtime as needed for sleep.  Breast cancer screening by mammogram -     MM 3D SCREEN BREAST BILATERAL; Future  Incisional hernia, without obstruction or gangrene -     Ambulatory referral to General Surgery  Postmenopausal estrogen deficiency -     DG Bone Density; Future  Mixed hyperlipidemia INSTRUCTIONS: Work on a low fat, heart healthy diet and participate in regular aerobic exercise program by working out at least 150 minutes per week. No fried foods. No junk foods, sodas, sugary drinks, unhealthy snacking, alcohol or smoking.     Patient has been counseled on age-appropriate routine health concerns for screening and prevention. These are reviewed and up-to-date. Referrals have been placed accordingly. Immunizations are up-to-date or declined.    Subjective:   Chief Complaint  Patient presents with  . Follow-up    Pt. is here for hypertension.    HPI [name] 67 y.o. female presents to office today for follow up to HTN.   Essential Hypertension Chronic. Poorly controlled. She is not taking her medications as prescribed. I have asked her to bring her pill bottles with her for the next appointment for pill counting. I do not think I should add on to her antihypertensive at this time as she is not taking her current medication as instructed.  CHRONIC HYPERTENSION Disease Monitoring  Blood pressure range: She does not monitor her blood pressure at home.   Chest pain: no   Dyspnea: no   Claudication: no  Medication compliance: no  Medication  Side Effects  Lightheadedness: no   Urinary frequency: no   Edema: no   Impotence: no  Preventitive Healthcare:  Exercise: no   Diet Pattern: salt added to cooking  Salt Restriction:  no  BP Readings from Last 3 Encounters:  08/12/17 (!) 172/99  04/22/17 (!) 184/107  12/01/16 (!) 188/98    Insomnia Chronic. Stable. Controlled with Trazodone 100 mg daily.     COPD Not taking  Dulera every day as prescribed.  "Makes me too jittery. If medicine makes me feel worse im just not going to take it. "  Hyperlipidemia Patient presents for follow up to hyperlipidemia.  She is not medication compliant. She is not diet compliant and denies skin xanthelasma or statin intolerance including myalgias.  Lab Results  Component Value Date   CHOL 155 04/22/2017   Lab Results  Component Value Date   HDL 43 04/22/2017   Lab Results  Component Value Date   LDLCALC 61 04/22/2017   Lab Results  Component Value Date   TRIG 256 (H) 04/22/2017   Lab Results  Component Value Date   CHOLHDL 3.6 04/22/2017   No results found for: LDLDIRECT Review of Systems  Constitutional: Negative for fever, malaise/fatigue and weight loss.  HENT: Negative.  Negative for nosebleeds.   Eyes: Negative.  Negative for blurred vision, double vision and photophobia.  Respiratory: Positive for cough and sputum production. Negative for shortness of breath and wheezing.   Cardiovascular: Negative.  Negative for chest pain, palpitations and leg swelling.  Gastrointestinal: Positive for heartburn. Negative for nausea and vomiting.  Musculoskeletal: Positive for back pain and joint pain. Negative for myalgias.  Neurological: Negative.  Negative for dizziness, focal weakness, seizures and headaches.  Psychiatric/Behavioral: Negative for suicidal ideas. The patient has insomnia.     Past Medical History:  Diagnosis Date  . ASCVD (arteriosclerotic cardiovascular disease)    CABG in 2006   . Cerebrovascular disease     . Coronary artery disease   . Diverticulitis   . DJD (degenerative joint disease) of cervical spine   . GERD (gastroesophageal reflux disease)   . H. pylori infection   . Hepatitis   . Hypertension   . Migraine headache   . PVD (peripheral vascular disease) (HCC)   . Shingles 11/21/2015  . Tobacco abuse     Past Surgical History:  Procedure Laterality Date  . ABDOMINAL HYSTERECTOMY    . CAROTID ENDARTERECTOMY     Right  . CHOLECYSTECTOMY    . COLONOSCOPY    . CORONARY ARTERY BYPASS GRAFT  2006   RIMA to the RCA, LIMA to the LAD, SVG to the circumflex  . HEMORRHOID SURGERY    . LEFT HEART CATH Bilateral 04/17/2013   Procedure: LEFT HEART CATH;  Surgeon: [name], MD;  [facility] CATH LAB;  Service: Cardiovascular;  Laterality: Bilateral;  . LEFT HEART CATHETERIZATION WITH CORONARY ANGIOGRAM N/A 09/27/2012   Procedure: LEFT HEART CATHETERIZATION WITH CORONARY ANGIOGRAM;  Surgeon: [name], MD;  Location: [facility] CATH LAB;  Service: Cardiovascular;  Laterality: N/A;    Family History  Problem Relation Age of Onset  . Pneumonia Mother   . Coronary artery disease Father   . Heart disease Sister   . Colon cancer Maternal Aunt        Great aunt, rectal  . Diabetes Maternal Uncle   . Heart disease Maternal Uncle   . Breast cancer Maternal Grandmother   . Heart disease Other        Father's side of family    Social History Reviewed with no changes to be made today.   Outpatient Medications Prior to Visit  Medication Sig Dispense Refill  . aspirin 81 MG tablet Take 1 tablet (81 mg total) by mouth daily. Reported on 06/07/2015 90 tablet 3  . atorvastatin (LIPITOR) 80 MG tablet Take 1 tablet (80 mg total) by mouth daily at 6 PM. 30 tablet 6  . carvedilol (COREG) 12.5 MG tablet Take 1 tablet (12.5 mg total) by mouth 2 (two) times daily with a meal. 60 tablet 6  . cloNIDine (CATAPRES) 0.2 MG tablet Take 1 tablet (0.2 mg total) by mouth 2 (two) times daily. 60 tablet 6   . furosemide (LASIX) 40 MG tablet Take 1 tablet (40 mg total) by mouth daily. 30 tablet 6  . mometasone-formoterol (DULERA) 100-5 MCG/ACT AERO Inhale 2 puffs into the lungs 2 (two) times daily. 3 Inhaler 11  . nitroGLYCERIN (NITROSTAT) 0.4 MG SL tablet Place 1 tablet (0.4 mg total) under the tongue every 5 (five) minutes x 3 doses as needed for chest pain. 25 tablet 3  . pantoprazole (PROTONIX) 40 MG tablet Take 1 tablet (40 mg total) by mouth daily. 30 tablet 3  . traZODone (DESYREL) 100 MG tablet Take 1 tablet (100 mg total) by mouth at bedtime as needed for sleep. 90 tablet 1   No facility-administered medications prior to visit.     Allergies  Allergen Reactions  . Ace Inhibitors Cough  . Codeine  Itching    welts  . Morphine Itching    Welts        Objective:    BP (!) 172/99 (BP Location: Right Arm, Patient Position: Sitting, Cuff Size: Large)   Pulse 63   Temp 98.6 F (37 C) (Oral)   Ht 5\' 8"  (1.727 m)   Wt 226 lb 6.4 oz (102.7 kg)   SpO2 95%   BMI 34.42 kg/m  Wt Readings from Last 3 Encounters:  08/12/17 226 lb 6.4 oz (102.7 kg)  04/22/17 226 lb 3.2 oz (102.6 kg)  12/01/16 224 lb 6.4 oz (101.8 kg)    Physical Exam  Constitutional: She is oriented to person, place, and time. She appears well-developed and well-nourished. She is cooperative.  HENT:  Head: Normocephalic and atraumatic.  Eyes: EOM are normal.  Neck: Normal range of motion.  Cardiovascular: Normal rate, regular rhythm, normal heart sounds and intact distal pulses. Exam reveals no gallop and no friction rub.  No murmur heard. Pulmonary/Chest: Effort normal. No tachypnea. No respiratory distress. She has no decreased breath sounds. She has no wheezes. She has no rhonchi (audible coarse breath sounds). She has no rales. She exhibits mass. She exhibits no tenderness.    Abdominal: Soft. Bowel sounds are normal. A hernia is present.  Musculoskeletal: Normal range of motion. She exhibits no edema.   Neurological: She is alert and oriented to person, place, and time. Coordination normal.  Skin: Skin is warm and dry.  Psychiatric: She has a normal mood and affect. Her behavior is normal. Judgment and thought content normal.  Nursing note and vitals reviewed.      Patient has been counseled extensively about nutrition and exercise as well as the importance of adherence with medications and regular follow-up. The patient was given clear instructions to go to ER or return to medical center if symptoms don't improve, worsen or new problems develop. The patient verbalized understanding.   Follow-up: Return in about 6 weeks (around 09/23/2017) for HTN/ MED CHECK/BRING YOUR MEDICATION BOTTLES WITH  YOU.   Claiborne Rigg, FNP-BC [facility] [place], [place] [phone]   08/17/2017, 1:48 PM

## 2017-08-17 ENCOUNTER — Encounter: Payer: Self-pay | Admitting: Nurse Practitioner

## 2017-08-27 ENCOUNTER — Other Ambulatory Visit: Payer: Self-pay | Admitting: Internal Medicine

## 2017-08-27 DIAGNOSIS — I10 Essential (primary) hypertension: Secondary | ICD-10-CM

## 2017-08-27 DIAGNOSIS — I25810 Atherosclerosis of coronary artery bypass graft(s) without angina pectoris: Secondary | ICD-10-CM

## 2017-08-27 DIAGNOSIS — I5032 Chronic diastolic (congestive) heart failure: Secondary | ICD-10-CM

## 2017-09-23 ENCOUNTER — Ambulatory Visit: Payer: Medicaid Other | Admitting: Nurse Practitioner

## 2017-09-29 ENCOUNTER — Other Ambulatory Visit: Payer: Self-pay | Admitting: Nurse Practitioner

## 2017-09-29 DIAGNOSIS — I5032 Chronic diastolic (congestive) heart failure: Secondary | ICD-10-CM

## 2017-09-29 DIAGNOSIS — I10 Essential (primary) hypertension: Secondary | ICD-10-CM

## 2017-09-29 DIAGNOSIS — I25810 Atherosclerosis of coronary artery bypass graft(s) without angina pectoris: Secondary | ICD-10-CM

## 2017-10-02 DIAGNOSIS — S82839A Other fracture of upper and lower end of unspecified fibula, initial encounter for closed fracture: Secondary | ICD-10-CM | POA: Insufficient documentation

## 2017-10-14 ENCOUNTER — Ambulatory Visit: Payer: Medicaid Other

## 2017-10-14 ENCOUNTER — Other Ambulatory Visit: Payer: Medicaid Other

## 2017-10-21 ENCOUNTER — Ambulatory Visit: Payer: Medicaid Other | Admitting: Nurse Practitioner

## 2017-11-22 ENCOUNTER — Other Ambulatory Visit: Payer: Self-pay | Admitting: Nurse Practitioner

## 2017-11-26 ENCOUNTER — Other Ambulatory Visit: Payer: Self-pay | Admitting: Nurse Practitioner

## 2017-11-26 ENCOUNTER — Telehealth: Payer: Self-pay

## 2017-11-26 NOTE — Telephone Encounter (Signed)
CMA spoke to patient.  Pt. Was concern with her Plavix refilled due to she cannot come in for her appt. Due to a recent fall accident.  Pt. Was aware refill was made for Plavix at [facility] and will call [facility] to see if she is able to get it.  Pt. Also was informed to make a OV appt. For additional refills.

## 2018-01-24 ENCOUNTER — Other Ambulatory Visit: Payer: Self-pay | Admitting: Nurse Practitioner

## 2018-01-25 ENCOUNTER — Other Ambulatory Visit: Payer: Self-pay | Admitting: Nurse Practitioner

## 2018-01-25 DIAGNOSIS — I10 Essential (primary) hypertension: Secondary | ICD-10-CM

## 2018-01-25 DIAGNOSIS — I5032 Chronic diastolic (congestive) heart failure: Secondary | ICD-10-CM

## 2018-01-25 DIAGNOSIS — I25810 Atherosclerosis of coronary artery bypass graft(s) without angina pectoris: Secondary | ICD-10-CM

## 2018-01-28 ENCOUNTER — Encounter: Payer: Self-pay | Admitting: Family Medicine

## 2018-01-28 ENCOUNTER — Ambulatory Visit: Payer: Medicaid Other | Attending: Family Medicine | Admitting: Family Medicine

## 2018-01-28 ENCOUNTER — Other Ambulatory Visit: Payer: Self-pay

## 2018-01-28 VITALS — BP 174/82 | HR 64 | Temp 98.2°F | Resp 16 | Wt 208.0 lb

## 2018-01-28 DIAGNOSIS — I2510 Atherosclerotic heart disease of native coronary artery without angina pectoris: Secondary | ICD-10-CM

## 2018-01-28 DIAGNOSIS — Z7902 Long term (current) use of antithrombotics/antiplatelets: Secondary | ICD-10-CM | POA: Diagnosis not present

## 2018-01-28 DIAGNOSIS — Z9119 Patient's noncompliance with other medical treatment and regimen: Secondary | ICD-10-CM | POA: Insufficient documentation

## 2018-01-28 DIAGNOSIS — Z79899 Other long term (current) drug therapy: Secondary | ICD-10-CM | POA: Diagnosis not present

## 2018-01-28 DIAGNOSIS — Z9114 Patient's other noncompliance with medication regimen: Secondary | ICD-10-CM

## 2018-01-28 DIAGNOSIS — I130 Hypertensive heart and chronic kidney disease with heart failure and stage 1 through stage 4 chronic kidney disease, or unspecified chronic kidney disease: Secondary | ICD-10-CM | POA: Insufficient documentation

## 2018-01-28 DIAGNOSIS — E782 Mixed hyperlipidemia: Secondary | ICD-10-CM

## 2018-01-28 DIAGNOSIS — I5032 Chronic diastolic (congestive) heart failure: Secondary | ICD-10-CM | POA: Diagnosis not present

## 2018-01-28 DIAGNOSIS — K219 Gastro-esophageal reflux disease without esophagitis: Secondary | ICD-10-CM

## 2018-01-28 DIAGNOSIS — N183 Chronic kidney disease, stage 3 unspecified: Secondary | ICD-10-CM

## 2018-01-28 DIAGNOSIS — J449 Chronic obstructive pulmonary disease, unspecified: Secondary | ICD-10-CM | POA: Diagnosis not present

## 2018-01-28 DIAGNOSIS — R350 Frequency of micturition: Secondary | ICD-10-CM | POA: Diagnosis not present

## 2018-01-28 DIAGNOSIS — Z7982 Long term (current) use of aspirin: Secondary | ICD-10-CM | POA: Diagnosis not present

## 2018-01-28 DIAGNOSIS — Z885 Allergy status to narcotic agent status: Secondary | ICD-10-CM | POA: Diagnosis not present

## 2018-01-28 DIAGNOSIS — R609 Edema, unspecified: Secondary | ICD-10-CM

## 2018-01-28 DIAGNOSIS — G4700 Insomnia, unspecified: Secondary | ICD-10-CM

## 2018-01-28 DIAGNOSIS — I25810 Atherosclerosis of coronary artery bypass graft(s) without angina pectoris: Secondary | ICD-10-CM | POA: Diagnosis not present

## 2018-01-28 DIAGNOSIS — R600 Localized edema: Secondary | ICD-10-CM

## 2018-01-28 DIAGNOSIS — I739 Peripheral vascular disease, unspecified: Secondary | ICD-10-CM | POA: Diagnosis not present

## 2018-01-28 DIAGNOSIS — Z8249 Family history of ischemic heart disease and other diseases of the circulatory system: Secondary | ICD-10-CM | POA: Insufficient documentation

## 2018-01-28 DIAGNOSIS — I10 Essential (primary) hypertension: Secondary | ICD-10-CM

## 2018-01-28 DIAGNOSIS — Z87891 Personal history of nicotine dependence: Secondary | ICD-10-CM | POA: Diagnosis not present

## 2018-01-28 DIAGNOSIS — Z91148 Patient's other noncompliance with medication regimen for other reason: Secondary | ICD-10-CM

## 2018-01-28 MED ORDER — ASPIRIN 81 MG PO TABS: 81.0000 mg | ORAL_TABLET | Freq: Every day | ORAL | 3 refills | Status: DC

## 2018-01-28 MED ORDER — CLONIDINE HCL 0.1 MG PO TABS
0.1000 mg | ORAL_TABLET | Freq: Once | ORAL | Status: AC
  Administered 2018-01-28: 0.1 mg via ORAL

## 2018-01-28 MED ORDER — CLOPIDOGREL BISULFATE 75 MG PO TABS: 75.0000 mg | ORAL_TABLET | Freq: Every day | ORAL | 3 refills | Status: DC

## 2018-01-28 MED ORDER — TRAZODONE HCL 100 MG PO TABS: 100.0000 mg | ORAL_TABLET | Freq: Every evening | ORAL | 1 refills | Status: DC | PRN

## 2018-01-28 MED ORDER — CLONIDINE HCL 0.1 MG PO TABS: 0.1000 mg | ORAL_TABLET | Freq: Two times a day (BID) | ORAL | 5 refills | Status: DC

## 2018-01-28 MED ORDER — PANTOPRAZOLE SODIUM 40 MG PO TBEC: 40.0000 mg | DELAYED_RELEASE_TABLET | Freq: Every day | ORAL | 3 refills | Status: DC

## 2018-01-28 MED ORDER — CARVEDILOL 12.5 MG PO TABS: 12.5000 mg | ORAL_TABLET | Freq: Two times a day (BID) | ORAL | 5 refills | Status: DC

## 2018-01-28 MED ORDER — MOMETASONE FURO-FORMOTEROL FUM 100-5 MCG/ACT IN AERO: 2.0000 | INHALATION_SPRAY | Freq: Two times a day (BID) | RESPIRATORY_TRACT | 3 refills | Status: DC

## 2018-01-28 MED ORDER — NITROGLYCERIN 0.4 MG SL SUBL: 0.4000 mg | SUBLINGUAL_TABLET | SUBLINGUAL | 3 refills | Status: DC | PRN

## 2018-01-28 NOTE — Progress Notes (Signed)
Subjective:    Patient ID: [name], female    DOB: [birth year], 67 y.o.   MRN: [id number]  HPI       67 yo female who is new to me as a patient with a history of non-compliance with medications who presents for continued follow-up of chronic medical issues and patient needs refill of medications.  Patient would also like a referral to see her cardiologist, Dr. Gwenlyn Found who she states that her last heart surgery.  Patient denies any issues with chest pain or palpitations and patient has had no use of nitroglycerin.  She continues to take her Plavix and daily 81 mg aspirin.  She denies any issues with unusual bleeding but does bruise easily.  Patient reports that in September she fell off of her porch and broke her right ankle.  Patient states that her orthopedic doctor encouraged surgery but she did not want to have this done.  Patient states that she discontinued use of her ankle brace/boot last week.      Patient reports that she has not been taking the clonidine 0.2 mg twice daily as prescribed.  Patient states that the 0.2 mg tablet makes her "loopy" therefore she has only been taking 0.1 mg sometimes in the morning.  Patient states that she no longer takes her fluid pill, Lasix as she states that this medication was discontinued ( medication appears active on her current medication list).  Patient reports that she has had chronic edema of the right lower extremity since she had prior femoropopliteal bypass.  Patient thinks that her COPD is stable.  Patient does not have any current issues with shortness of breath or cough.  Patient does need refill of medication to help with acid reflux as well as insomnia.  Patient continues to have difficulty with sleep if she does not take medication.  Patient denies any current abdominal pain related to reflux.       Patient reports that some of her issue with noncompliance with follow-up appointments both here and with specialist is based on financial issues.   Patient declined speaking with the social worker at today's visit but I will have the medical social worker contact patient to see if there are any resources available to help with her issues.  Past Medical History:  Diagnosis Date  . ASCVD (arteriosclerotic cardiovascular disease)    CABG in 2006   . Cerebrovascular disease   . Coronary artery disease   . Diverticulitis   . DJD (degenerative joint disease) of cervical spine   . GERD (gastroesophageal reflux disease)   . H. pylori infection   . Hepatitis   . Hypertension   . Migraine headache   . PVD (peripheral vascular disease) ([place])   . Shingles 11/21/2015  . Tobacco abuse    Past Surgical History:  Procedure Laterality Date  . ABDOMINAL HYSTERECTOMY    . CAROTID ENDARTERECTOMY     Right  . CHOLECYSTECTOMY    . COLONOSCOPY    . CORONARY ARTERY BYPASS GRAFT  2006   RIMA to the RCA, LIMA to the LAD, SVG to the circumflex  . HEMORRHOID SURGERY    . LEFT HEART CATH Bilateral 04/17/2013   Procedure: LEFT HEART CATH;  Surgeon: Lorretta Harp, MD;  Location: [facility] CATH LAB;  Service: Cardiovascular;  Laterality: Bilateral;  . LEFT HEART CATHETERIZATION WITH CORONARY ANGIOGRAM N/A 09/27/2012   Procedure: LEFT HEART CATHETERIZATION WITH CORONARY ANGIOGRAM;  Surgeon: Lorretta Harp, MD;  Location: [facility]  CATH LAB;  Service: Cardiovascular;  Laterality: N/A;   Family History  Problem Relation Age of Onset  . Pneumonia Mother   . Coronary artery disease Father   . Heart disease Sister   . Colon cancer Maternal Aunt        Great aunt, rectal  . Diabetes Maternal Uncle   . Heart disease Maternal Uncle   . Breast cancer Maternal Grandmother   . Heart disease Other        Father's side of family   Social History   Tobacco Use  . Smoking status: Former Smoker    Packs/day: 0.50  . Smokeless tobacco: Former Systems developer    Quit date: 04/17/2013  . Tobacco comment: 1/2 pack per day since 2005, 40 pack years  Substance Use Topics  . Alcohol  use: No  . Drug use: No   Allergies  Allergen Reactions  . Ace Inhibitors Cough  . Codeine Itching    welts  . Morphine Itching    Welts      Review of Systems  Constitutional: Positive for fatigue. Negative for chills and fever.  Respiratory: Negative for cough and shortness of breath.   Cardiovascular: Positive for leg swelling (occasional). Negative for chest pain and palpitations.  Gastrointestinal: Negative for abdominal pain and nausea.  Genitourinary: Positive for frequency. Negative for dysuria.  Musculoskeletal: Positive for arthralgias, gait problem and joint swelling.       S/p right ankle fracture  Neurological: Negative for dizziness and headaches.  Hematological: Negative for adenopathy. Bruises/bleeds easily (on plavix and aspirin).       Objective:   Physical Exam BP (!) 203/106 (BP Location: Left Arm, Patient Position: Sitting, Cuff Size: Large)   Pulse 64   Temp 98.2 F (36.8 C) (Oral)   Resp 16   Wt 208 lb (94.3 kg)   SpO2 96%   BMI 31.63 kg/m  Nurse's notes and vital signs reviewed General-well-nourished, well-developed overweight older female in no acute distress ENT-TMs dull, nares with mild edema of the nasal turbinates, patient with mild posterior pharynx erythema Neck-supple, no lymphadenopathy, no JVD, no carotid bruit Lungs-clear to auscultation bilaterally with mild decreased breath sounds of the lung bases Cardiovascular-regular rate and rhythm Abdomen-soft, nontender Back-no CVA tenderness Extremities-bilateral lower extremity edema with visible difference in size of the right lower extremity when compared to the left from the below the knee on the right.  Patient with larger diameter of the right lower extremity as compared to the left.  No calf tenderness.  Edema is nonpitting Psych-patient initially attempted to talk loudly and rapidly to explain her reasons for noncompliance with medications but when I spoke with her calmly and  nonjudgmentally, patient interacted appropriately and seemed to exhibit a normal mood and judgment       Assessment & Plan:  1. Essential hypertension Patient's blood pressure was greatly elevated at today's visit.  Patient does report noncompliance with medications especially with Catapres 0.2 mg which she states makes her loopy therefore she either does not take the medication at all or occasionally takes the medication at bedtime.  I discussed with the patient that skipping doses/taking doses randomly can actually cause rebound hypertension.  Since patient states that she cannot tolerate the 0.2 mg but cannot tolerate the 0.1 mg, patient agreed to take 0.1 mg twice daily of the clonidine.  Patient reports that she still has a significant supply of 0.1 mg clonidine at home as she sometimes takes this dose instead of  the 0.2 mg.  Patient was given clonidine 0.1 mg here in the office due to her elevated blood pressure and since she states that she feels "loopy" after the 0.2 mg and has other things that she needs to do after her appointment at today's visit.  Discussed with the patient that her untreated hypertension increases her risk for stroke and that she already has evidence of chronic kidney disease which is likely related to her uncontrolled hypertension.  Patient did seem to listen and expressed that she would try to be compliant with blood pressure medications.  Referral was made for patient to see her cardiologist, Dr. Gwenlyn Found, but I also stressed the patient would still need primary care follow-up on a regular basis due to her multiple chronic medical issues.  Patient was provided with new prescriptions for clonidine 0.1 mg as well as her Coreg 12.5 mg.  Patient reports that her Lasix was discontinued and I am not sure if this was discontinued by her provider or if patient decided to self discontinue the medication but patient was asked to discuss the Lasix with her cardiologist.  I suspect the  patient also has heart failure related to her untreated hypertension.  Patient will have CMP in follow-up of hypertension and statin medication use as well as urinalysis and follow-up of hypertension and chronic kidney disease and patient with complaint of urinary frequency on review of systems. - cloNIDine (CATAPRES) tablet 0.1 mg - Ambulatory referral to Cardiology - Comprehensive metabolic panel - POCT URINALYSIS DIP (CLINITEK) - cloNIDine (CATAPRES) 0.1 MG tablet; Take 1 tablet (0.1 mg total) by mouth 2 (two) times daily. To lower blood pressure  Dispense: 60 tablet; Refill: 5 - carvedilol (COREG) 12.5 MG tablet; Take 1 tablet (12.5 mg total) by mouth 2 (two) times daily with a meal.  Dispense: 60 tablet; Refill: 5  2. Coronary artery disease involving native coronary artery of native heart without angina pectoris Patient with a prior history of CABG secondary to heart disease related to coronary atherosclerosis.  Patient will be referred back to her cardiologist for further evaluation and treatment.  Patient is provided with refill of Nitrostat that she does deny any recent need for use of nitroglycerin.  Patient is provided with refills of her carvedilol and aspirin.  Patient reports that she does have Plavix.  Patient is encouraged to control her blood pressure, follow a low-fat diet, exercise as tolerated and remain compliant with statin medication and use of antiplatelet/anticoagulant therapy. - Ambulatory referral to Cardiology - Comprehensive metabolic panel - nitroGLYCERIN (NITROSTAT) 0.4 MG SL tablet; Place 1 tablet (0.4 mg total) under the tongue every 5 (five) minutes x 3 doses as needed for chest pain.  Dispense: 25 tablet; Refill: 3 - carvedilol (COREG) 12.5 MG tablet; Take 1 tablet (12.5 mg total) by mouth 2 (two) times daily with a meal.  Dispense: 60 tablet; Refill: 5 - aspirin 81 MG tablet; Take 1 tablet (81 mg total) by mouth daily. Reported on 06/07/2015  Dispense: 90 tablet;  Refill: 3  3. Mixed hyperlipidemia Importance of compliance with atorvastatin due to patient's history of peripheral arterial disease, carotid atherosclerotic disease status post endarterectomy and coronary artery disease discussed with the patient at today's visit.  Patient will have CMP in follow-up of long-term use of high-dose statin - Comprehensive metabolic panel  4. Peripheral vascular disease of lower extremity ([place]) Patient was encouraged to schedule follow-up with her vascular surgeon regarding her history of peripheral vascular disease requiring intervention.  Discussed importance of continuing to remain abstinent from use of cigarettes as well as the need to remain compliant with statin therapy as well as aspirin and Plavix  5. Chronic obstructive pulmonary disease, unspecified COPD type ([place]) Patient with chronic obstructive pulmonary disease with history of tobacco use.  Patient provided with refill of Dulera.  Patient feels that her COPD is currently stable as she is not having any significant issues with shortness of breath or cough.  Importance of continuing to not smoke was discussed with the patient - mometasone-formoterol (DULERA) 100-5 MCG/ACT AERO; Inhale 2 puffs into the lungs 2 (two) times daily.  Dispense: 3 Inhaler; Refill: 3  6. Gastroesophageal reflux disease, esophagitis presence not specified Patient provided with refill of pantoprazole to help with stomach protection as she is on both Plavix and aspirin and to help with reflux symptoms.  Patient is aware that she does need to report any unusual bleeding or black stools.  Patient should also avoid foods that she knows tend to trigger her GERD symptoms as well as avoidance of late night eating - pantoprazole (PROTONIX) 40 MG tablet; Take 1 tablet (40 mg total) by mouth daily.  Dispense: 30 tablet; Refill: 3  7. CKD (chronic kidney disease) stage 3, GFR 30-59 ml/min (HCC) Patient with chronic kidney disease which is  likely related to her poorly controlled hypertension.  Patient with a creatinine of 1.24 with EGFR of 45 in March of this year.  Patient will have urinalysis, CBC and recheck of creatinine and EGFR/electrolytes as part of her CMP which will be done at today's visit - CBC with Differential  8. Insomnia, unspecified type Patient reports longstanding issues with insomnia and prescription provided at today's visit for trazodone refill.  Patient made aware that medications that cause drowsiness can increase fall risk - traZODone (DESYREL) 100 MG tablet; Take 1 tablet (100 mg total) by mouth at bedtime as needed for sleep.  Dispense: 90 tablet; Refill: 1  9. Encounter for long-term (current) use of medications Patient with long-term use of high-risk medications including statin therapy and patient will have CMP, urinalysis and hemoglobin A1c done in follow-up - Comprehensive metabolic panel - POCT URINALYSIS DIP (CLINITEK) - Hemoglobin A1c  10. Long-term use of aspirin therapy 11. Encounter for current long term use of antiplatelet drug Patient with long-term use of aspirin therapy as well as antiplatelet therapy with Plavix and patient does report that she bruises easily but has had no unusual bleeding.  Patient is aware that she does need to seek medical attention if she does develop unusual bleeding such as nosebleeds, blood in the urine, blood in the stool or black stools.  Patient will have CBC in follow-up of medication use -CBC  12. Peripheral edema Patient with chronic lower extremity edema, right greater than left.  Patient reports that she was taken off of Lasix however patient has history of noncompliance and may have decided to self discontinue the use of this medication.  I suspect the patient may also have some heart failure secondary to her history of poorly controlled hypertension.  Patient also likely has venous insufficiency and this may be worsened in the right leg especially if this  leg was also used for vein harvesting for patient's prior CABG.  Patient was asked to discuss her peripheral edema and the need for Lasix therapy with her cardiologist at her upcoming appointment for which a referral was placed at today's visit.  Patient was also asked to make a follow-up  appointment with her vascular doctor however on review of chart, I did not see any notes indicating that patient had had any recent follow-up with vascular  13. Noncompliance w/medication treatment due to intermit use of medication Patient on review of chart has had a longstanding history of noncompliance with her medications.  At today's visit I tried to discuss with the patient why each of her medications was important and the complications that could arise with noncompliance  14. Urinary frequency Patient with complaint of urinary frequency at today's visit and urinalysis and hemoglobin A1c were ordered to see if her frequency might be related to urinary tract infection or to elevated blood sugars - POCT URINALYSIS DIP (CLINITEK) - Hemoglobin A1c  15. 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  Cardiology referral placed per patient request which would also have been recommended based on her medical history and history of noncompliance and - nitroGLYCERIN (NITROSTAT) 0.4 MG SL tablet; Place 1 tablet (0.4 mg total) under the tongue every 5 (five) minutes x 3 doses as needed for chest pain.  Dispense: 25 tablet; Refill: 3 - carvedilol (COREG) 12.5 MG tablet; Take 1 tablet (12.5 m 9 g total) by mouth 2 (two) times daily with a meal.  Dispense: 60 tablet; Refill: 5 - aspirin 81 MG tablet; Take 1 tablet (81 mg total) by mouth daily. Reported on 06/07/2015  Dispense: 90 tablet; Refill: 3  16. Chronic diastolic congestive heart failure (HCC) On review of medical records, patient did have prior  diagnosis of chronic diastolic congestive heart failure which is likely related to her uncontrolled hypertension.  Patient reports that her Lasix was discontinued and patient was asked to discuss the need for Lasix with her cardiologist at her upcoming appointment as it is unsure if patient actually was told by medical provider to discontinue her Lasix or if this is a medication that patient decided to discontinue on her own.  Patient provided with refill of Coreg.  Discussed importance of compliance with medications for treatment of hypertension. - carvedilol (COREG) 12.5 MG tablet; Take 1 tablet (12.5 mg total) by mouth 2 (two) times daily with a meal.  Dispense: 60 tablet; Refill: 5  *Patient was offered but declined influenza immunization at today's visit  An After Visit Summary was printed and given to the patient. Allergies as of 01/28/2018      Reactions   Ace Inhibitors Cough   Codeine Itching   welts   Morphine Itching   Welts      Medication List       Accurate as of January 28, 2018 11:59 PM. Always use your most recent med list.        aspirin 81 MG tablet Take 1 tablet (81 mg total) by mouth daily. Reported on 06/07/2015   atorvastatin 80 MG tablet Commonly known as:  LIPITOR Take 1 tablet (80 mg total) by mouth daily at 6 PM.   carvedilol 12.5 MG tablet Commonly known as:  COREG Take 1 tablet (12.5 mg total) by mouth 2 (two) times daily with a meal.   cloNIDine 0.1 MG tablet Commonly known as:  CATAPRES Take 1 tablet (0.1 mg total) by mouth 2 (two) times daily. To lower blood pressure   clopidogrel 75 MG tablet Commonly known as:  PLAVIX Take 1 tablet (75 mg total) by mouth daily.   furosemide 40 MG tablet Commonly known as:  LASIX Take 1  tablet (40 mg total) by mouth daily.   mometasone-formoterol 100-5 MCG/ACT Aero Commonly known as:  DULERA Inhale 2 puffs into the lungs 2 (two) times daily.   nitroGLYCERIN 0.4 MG SL tablet Commonly known as:   NITROSTAT Place 1 tablet (0.4 mg total) under the tongue every 5 (five) minutes x 3 doses as needed for chest pain.   pantoprazole 40 MG tablet Commonly known as:  PROTONIX Take 1 tablet (40 mg total) by mouth daily.   traZODone 100 MG tablet Commonly known as:  DESYREL Take 1 tablet (100 mg total) by mouth at bedtime as needed for sleep.       Return in about 5 months (around 06/29/2018) for chronic issues with Z. Raul Del.

## 2018-01-28 NOTE — Progress Notes (Signed)
Update medications until she see Cardiologist

## 2018-02-01 ENCOUNTER — Telehealth: Payer: Self-pay

## 2018-02-01 ENCOUNTER — Encounter: Payer: Self-pay | Admitting: Family Medicine

## 2018-02-01 NOTE — Telephone Encounter (Signed)
[name] is not preferred by the patients insurance, covered products are Symbicort or Breo Ellipta please switch to alternative product or let me know if the patient requires the PA to get [name] specifically.

## 2018-02-01 NOTE — Telephone Encounter (Signed)
Please contact the patient and see if she has a preference between the two covered products

## 2018-02-15 NOTE — Telephone Encounter (Signed)
I have been unable to reach the patient to see if she has a preference between the two covered products.

## 2018-02-26 ENCOUNTER — Other Ambulatory Visit: Payer: Self-pay | Admitting: Nurse Practitioner

## 2018-02-26 DIAGNOSIS — I25810 Atherosclerosis of coronary artery bypass graft(s) without angina pectoris: Secondary | ICD-10-CM

## 2018-02-26 DIAGNOSIS — E785 Hyperlipidemia, unspecified: Secondary | ICD-10-CM

## 2018-03-05 ENCOUNTER — Ambulatory Visit: Payer: Medicaid Other | Admitting: Cardiovascular Disease

## 2018-05-31 ENCOUNTER — Other Ambulatory Visit: Payer: Self-pay | Admitting: Nurse Practitioner

## 2018-05-31 DIAGNOSIS — I25810 Atherosclerosis of coronary artery bypass graft(s) without angina pectoris: Secondary | ICD-10-CM

## 2018-05-31 DIAGNOSIS — E785 Hyperlipidemia, unspecified: Secondary | ICD-10-CM

## 2018-08-06 ENCOUNTER — Other Ambulatory Visit: Payer: Self-pay | Admitting: Family Medicine

## 2018-08-06 DIAGNOSIS — I25810 Atherosclerosis of coronary artery bypass graft(s) without angina pectoris: Secondary | ICD-10-CM

## 2018-08-06 DIAGNOSIS — I10 Essential (primary) hypertension: Secondary | ICD-10-CM

## 2018-08-06 DIAGNOSIS — I5032 Chronic diastolic (congestive) heart failure: Secondary | ICD-10-CM

## 2018-08-06 DIAGNOSIS — I2510 Atherosclerotic heart disease of native coronary artery without angina pectoris: Secondary | ICD-10-CM

## 2018-08-09 ENCOUNTER — Other Ambulatory Visit: Payer: Self-pay | Admitting: Family Medicine

## 2018-08-09 DIAGNOSIS — I5032 Chronic diastolic (congestive) heart failure: Secondary | ICD-10-CM

## 2018-08-09 DIAGNOSIS — I25810 Atherosclerosis of coronary artery bypass graft(s) without angina pectoris: Secondary | ICD-10-CM

## 2018-08-09 DIAGNOSIS — I2510 Atherosclerotic heart disease of native coronary artery without angina pectoris: Secondary | ICD-10-CM

## 2018-08-09 DIAGNOSIS — I10 Essential (primary) hypertension: Secondary | ICD-10-CM

## 2018-08-10 ENCOUNTER — Other Ambulatory Visit: Payer: Self-pay | Admitting: Family Medicine

## 2018-08-10 DIAGNOSIS — K219 Gastro-esophageal reflux disease without esophagitis: Secondary | ICD-10-CM

## 2018-09-08 ENCOUNTER — Other Ambulatory Visit: Payer: Self-pay | Admitting: Nurse Practitioner

## 2018-09-08 DIAGNOSIS — E785 Hyperlipidemia, unspecified: Secondary | ICD-10-CM

## 2018-09-08 DIAGNOSIS — I25810 Atherosclerosis of coronary artery bypass graft(s) without angina pectoris: Secondary | ICD-10-CM

## 2018-09-10 ENCOUNTER — Other Ambulatory Visit: Payer: Self-pay | Admitting: Family Medicine

## 2018-09-10 DIAGNOSIS — I2510 Atherosclerotic heart disease of native coronary artery without angina pectoris: Secondary | ICD-10-CM

## 2018-09-10 DIAGNOSIS — I25810 Atherosclerosis of coronary artery bypass graft(s) without angina pectoris: Secondary | ICD-10-CM

## 2018-09-10 DIAGNOSIS — I10 Essential (primary) hypertension: Secondary | ICD-10-CM

## 2018-09-10 DIAGNOSIS — I5032 Chronic diastolic (congestive) heart failure: Secondary | ICD-10-CM

## 2018-09-20 ENCOUNTER — Other Ambulatory Visit: Payer: Self-pay

## 2018-09-20 ENCOUNTER — Ambulatory Visit: Payer: Medicare Other | Attending: Nurse Practitioner | Admitting: Nurse Practitioner

## 2018-09-20 ENCOUNTER — Encounter: Payer: Self-pay | Admitting: Nurse Practitioner

## 2018-09-20 ENCOUNTER — Other Ambulatory Visit: Payer: Self-pay | Admitting: Nurse Practitioner

## 2018-09-20 VITALS — BP 211/109 | HR 66 | Temp 99.1°F | Ht 68.0 in | Wt 215.0 lb

## 2018-09-20 DIAGNOSIS — Z79899 Other long term (current) drug therapy: Secondary | ICD-10-CM | POA: Insufficient documentation

## 2018-09-20 DIAGNOSIS — Z13820 Encounter for screening for osteoporosis: Secondary | ICD-10-CM

## 2018-09-20 DIAGNOSIS — Z8249 Family history of ischemic heart disease and other diseases of the circulatory system: Secondary | ICD-10-CM | POA: Diagnosis not present

## 2018-09-20 DIAGNOSIS — I252 Old myocardial infarction: Secondary | ICD-10-CM | POA: Insufficient documentation

## 2018-09-20 DIAGNOSIS — Z760 Encounter for issue of repeat prescription: Secondary | ICD-10-CM | POA: Diagnosis not present

## 2018-09-20 DIAGNOSIS — I2510 Atherosclerotic heart disease of native coronary artery without angina pectoris: Secondary | ICD-10-CM | POA: Diagnosis not present

## 2018-09-20 DIAGNOSIS — I10 Essential (primary) hypertension: Secondary | ICD-10-CM | POA: Diagnosis not present

## 2018-09-20 DIAGNOSIS — Z7951 Long term (current) use of inhaled steroids: Secondary | ICD-10-CM | POA: Diagnosis not present

## 2018-09-20 DIAGNOSIS — J449 Chronic obstructive pulmonary disease, unspecified: Secondary | ICD-10-CM | POA: Diagnosis not present

## 2018-09-20 DIAGNOSIS — E785 Hyperlipidemia, unspecified: Secondary | ICD-10-CM

## 2018-09-20 DIAGNOSIS — R7303 Prediabetes: Secondary | ICD-10-CM | POA: Diagnosis not present

## 2018-09-20 DIAGNOSIS — K219 Gastro-esophageal reflux disease without esophagitis: Secondary | ICD-10-CM | POA: Diagnosis not present

## 2018-09-20 DIAGNOSIS — I110 Hypertensive heart disease with heart failure: Secondary | ICD-10-CM | POA: Insufficient documentation

## 2018-09-20 DIAGNOSIS — M1990 Unspecified osteoarthritis, unspecified site: Secondary | ICD-10-CM | POA: Insufficient documentation

## 2018-09-20 DIAGNOSIS — I959 Hypotension, unspecified: Secondary | ICD-10-CM | POA: Insufficient documentation

## 2018-09-20 DIAGNOSIS — Z780 Asymptomatic menopausal state: Secondary | ICD-10-CM

## 2018-09-20 DIAGNOSIS — Z951 Presence of aortocoronary bypass graft: Secondary | ICD-10-CM | POA: Diagnosis not present

## 2018-09-20 DIAGNOSIS — I739 Peripheral vascular disease, unspecified: Secondary | ICD-10-CM | POA: Diagnosis not present

## 2018-09-20 DIAGNOSIS — Z7982 Long term (current) use of aspirin: Secondary | ICD-10-CM | POA: Insufficient documentation

## 2018-09-20 DIAGNOSIS — Z7902 Long term (current) use of antithrombotics/antiplatelets: Secondary | ICD-10-CM | POA: Diagnosis not present

## 2018-09-20 DIAGNOSIS — E782 Mixed hyperlipidemia: Secondary | ICD-10-CM | POA: Diagnosis not present

## 2018-09-20 DIAGNOSIS — F17210 Nicotine dependence, cigarettes, uncomplicated: Secondary | ICD-10-CM | POA: Diagnosis not present

## 2018-09-20 DIAGNOSIS — I5032 Chronic diastolic (congestive) heart failure: Secondary | ICD-10-CM | POA: Insufficient documentation

## 2018-09-20 DIAGNOSIS — Z1231 Encounter for screening mammogram for malignant neoplasm of breast: Secondary | ICD-10-CM

## 2018-09-20 LAB — GLUCOSE, POCT (MANUAL RESULT ENTRY): POC Glucose: 122 mg/dl — AB (ref 70–99)

## 2018-09-20 LAB — POCT GLYCOSYLATED HEMOGLOBIN (HGB A1C): Hemoglobin A1C: 5.6 % (ref 4.0–5.6)

## 2018-09-20 MED ORDER — CLONIDINE HCL 0.1 MG PO TABS
0.2000 mg | ORAL_TABLET | Freq: Once | ORAL | Status: AC
  Administered 2018-09-20: 0.2 mg via ORAL

## 2018-09-20 MED ORDER — FUROSEMIDE 40 MG PO TABS: 40.0000 mg | ORAL_TABLET | Freq: Every day | ORAL | 6 refills | Status: DC

## 2018-09-20 MED ORDER — ATORVASTATIN CALCIUM 80 MG PO TABS: ORAL_TABLET | ORAL | 2 refills | Status: DC

## 2018-09-20 MED ORDER — CARVEDILOL 12.5 MG PO TABS: 12.5000 mg | ORAL_TABLET | Freq: Two times a day (BID) | ORAL | 2 refills | Status: DC

## 2018-09-20 MED ORDER — CLOPIDOGREL BISULFATE 75 MG PO TABS: 75.0000 mg | ORAL_TABLET | Freq: Every day | ORAL | 3 refills | Status: DC

## 2018-09-20 MED ORDER — CLONIDINE HCL 0.1 MG PO TABS: 0.1000 mg | ORAL_TABLET | Freq: Two times a day (BID) | ORAL | 2 refills | Status: DC

## 2018-09-20 MED ORDER — PANTOPRAZOLE SODIUM 40 MG PO TBEC: 40.0000 mg | DELAYED_RELEASE_TABLET | Freq: Every day | ORAL | 0 refills | Status: DC

## 2018-09-20 MED ORDER — MOMETASONE FURO-FORMOTEROL FUM 100-5 MCG/ACT IN AERO: 2.0000 | INHALATION_SPRAY | Freq: Two times a day (BID) | RESPIRATORY_TRACT | 6 refills | Status: DC

## 2018-09-20 MED ORDER — LOSARTAN POTASSIUM 25 MG PO TABS: 25.0000 mg | ORAL_TABLET | Freq: Every day | ORAL | 2 refills | Status: DC

## 2018-09-20 NOTE — Progress Notes (Signed)
Assessment & Plan:  [name] was seen today for medication refill.  Diagnoses and all orders for this visit:  Prediabetes -     Glucose (CBG) -     HgB A1c  Essential hypertension -     cloNIDine (CATAPRES) tablet 0.2 mg -     furosemide (LASIX) 40 MG tablet; Take 1 tablet (40 mg total) by mouth daily. -     cloNIDine (CATAPRES) 0.1 MG tablet; Take 1 tablet (0.1 mg total) by mouth 2 (two) times daily. To lower blood pressure -     carvedilol (COREG) 12.5 MG tablet; Take 1 tablet (12.5 mg total) by mouth 2 (two) times daily with a meal for 90 doses. MUST MAKE APPT FOR FURTHER REFILLS -     losartan (COZAAR) 25 MG tablet; Take 1 tablet (25 mg total) by mouth daily. -     CMP14+EGFR Continue all antihypertensives as prescribed.  Remember to bring in your blood pressure log with you for your follow up appointment.  DASH/Mediterranean Diets are healthier choices for HTN.   Chronic diastolic congestive heart failure (HCC) -     furosemide (LASIX) 40 MG tablet; Take 1 tablet (40 mg total) by mouth daily. -     carvedilol (COREG) 12.5 MG tablet; Take 1 tablet (12.5 mg total) by mouth 2 (two) times daily with a meal for 90 doses. MUST MAKE APPT FOR FURTHER REFILLS DASH DIET  Chronic obstructive pulmonary disease, unspecified COPD type ([place]) -     mometasone-formoterol (DULERA) 100-5 MCG/ACT AERO; Inhale 2 puffs into the lungs 2 (two) times daily. -     CBC  Gastroesophageal reflux disease, esophagitis presence not specified -     pantoprazole (PROTONIX) 40 MG tablet; Take 1 tablet (40 mg total) by mouth daily. MUST MAKE APPT FOR FURTHER REFILLS INSTRUCTIONS: Avoid GERD Triggers: acidic, spicy or fried foods, caffeine, coffee, sodas,  alcohol and chocolate.    Coronary artery disease involving native coronary artery of native heart without angina pectoris -     clopidogrel (PLAVIX) 75 MG tablet; Take 1 tablet (75 mg total) by mouth daily.   Hyperlipidemia, unspecified hyperlipidemia type  -     atorvastatin (LIPITOR) 80 MG tablet; TAKE 1 TABLET BY MOUTH DAILY AT 6 PM -     Lipid panel INSTRUCTIONS: Work on a low fat, heart healthy diet and participate in regular aerobic exercise program by working out at least 150 minutes per week; 5 days a week-30 minutes per day. Avoid red meat, fried foods. junk foods, sodas, sugary drinks, unhealthy snacking, alcohol and smoking.  Drink at least 48oz of water per day and monitor your carbohydrate intake daily.    Breast cancer screening by mammogram -     MM 3D SCREEN BREAST BILATERAL; Future  Encounter for osteoporosis screening in asymptomatic postmenopausal patient -     DG Bone Density; Future    Patient has been counseled on age-appropriate routine health concerns for screening and prevention. These are reviewed and up-to-date. Referrals have been placed accordingly. Immunizations are up-to-date or declined.    Subjective:   Chief Complaint  Patient presents with  . Medication Refill    Pt. is here for medication refills. Pt. is requesting Dexilant instead of Pantoprazole.    HPI [name] 68 y.o. female presents to office today for follow up.  has a past medical history of ASCVD (arteriosclerotic cardiovascular disease), Cerebrovascular disease, Coronary artery disease, Diverticulitis, DJD (degenerative joint disease) of cervical spine,  GERD (gastroesophageal reflux disease), H. pylori infection, Hepatitis, Hypertension, Migraine headache, PVD (peripheral vascular disease) ([place]), Shingles (11/21/2015), and Tobacco abuse. I have not seen [name] in this office since 08-2017. She has a history of poor compliance. She has not been seen by Dr. Rico Ala (cardiology) in almost 4 years. Significant cardiac history includes history of MI, CVD status post right CEA, PVD status post right femoropopliteal bypass, normal left ventricular systolic function, essential hypertension and mixed hyperlipidemia along with obesity.  Her last  appointment with Dr. Verl Blalock he wanted to schedule a carotid ultrasound however patient did not follow-up with this. She will need to be referred back to cardiology. She was also referred to Dr. Gwenlyn Found in December and did not return any calls to schedule.   Essential Hypertension  Blood pressure is markedly elevated today.  Cloniidne and coreg. She is not taking coreg and clonidine as prescribed. Has not been on losartan. Unclear this was even discontinued.  She was initially on 50 mg of losartan however states that particular dosage made her tired and therefore dose was reduced to 25 mg.  I believe she may have taken herself off of the losartan. BP Readings from Last 3 Encounters:  09/20/18 (!) 211/109  01/28/18 (!) 174/82  08/12/17 (!) 172/99  This point of clonidine in the office today patient's blood pressure was not significantly lowered.  She is asymptomatic however I have recommended she be seen in the emergency room and she adamantly refused. Denies chest pain, shortness of breath, palpitations, lightheadedness, dizziness, or visual disturbances.  She does have moderate bilateral lower extremity edema.   Prediabetes Surprisingly well controlled with diet only at this time. Lab Results  Component Value Date   HGBA1C 5.6 09/20/2018    Review of Systems  Constitutional: Negative for fever, malaise/fatigue and weight loss.  HENT: Negative.  Negative for nosebleeds.   Eyes: Negative.  Negative for blurred vision, double vision and photophobia.  Respiratory: Negative.  Negative for cough and shortness of breath.   Cardiovascular: Positive for leg swelling (She has not been taking her Lasix). Negative for chest pain and palpitations.  Gastrointestinal: Positive for heartburn. Negative for nausea and vomiting.  Musculoskeletal: Positive for joint pain. Negative for myalgias.  Neurological: Positive for headaches (Chronic migraines). Negative for dizziness, focal weakness and seizures.   Psychiatric/Behavioral: Negative.  Negative for suicidal ideas.    Past Medical History:  Diagnosis Date  . ASCVD (arteriosclerotic cardiovascular disease)    CABG in 2006   . Cerebrovascular disease   . Coronary artery disease   . Diverticulitis   . DJD (degenerative joint disease) of cervical spine   . GERD (gastroesophageal reflux disease)   . H. pylori infection   . Hepatitis   . Hypertension   . Migraine headache   . PVD (peripheral vascular disease) ([place])   . Shingles 11/21/2015  . Tobacco abuse     Past Surgical History:  Procedure Laterality Date  . ABDOMINAL HYSTERECTOMY    . CAROTID ENDARTERECTOMY     Right  . CHOLECYSTECTOMY    . COLONOSCOPY    . CORONARY ARTERY BYPASS GRAFT  2006   RIMA to the RCA, LIMA to the LAD, SVG to the circumflex  . HEMORRHOID SURGERY    . LEFT HEART CATH Bilateral 04/17/2013   Procedure: LEFT HEART CATH;  Surgeon: Lorretta Harp, MD;  [facility]: [facility] C[facility] LAB;  Service: Cardiovascular;  Laterality: Bilateral;  . LEFT HEART CATHETERIZATION WITH CORONARY ANGIOGRAM N/A  09/27/2012   Procedure: LEFT HEART CATHETERIZATION WITH CORONARY ANGIOGRAM;  Surgeon: Lorretta Harp, MD;  Location: [facility] CATH LAB;  Service: Cardiovascular;  Laterality: N/A;    Family History  Problem Relation Age of Onset  . Pneumonia Mother   . Coronary artery disease Father   . Heart disease Sister   . Colon cancer Maternal Aunt        Great aunt, rectal  . Diabetes Maternal Uncle   . Heart disease Maternal Uncle   . Breast cancer Maternal Grandmother   . Heart disease Other        Father's side of family    Social History Reviewed with no changes to be made today.   Outpatient Medications Prior to Visit  Medication Sig Dispense Refill  . aspirin 81 MG tablet Take 1 tablet (81 mg total) by mouth daily. Reported on 06/07/2015 90 tablet 3  . nitroGLYCERIN (NITROSTAT) 0.4 MG SL tablet Place 1 tablet (0.4 mg total) under the tongue every 5 (five) minutes x 3 doses  as needed for chest pain. 25 tablet 3  . atorvastatin (LIPITOR) 80 MG tablet TAKE 1 TABLET BY MOUTH DAILY AT 6 PM 30 tablet 2  . carvedilol (COREG) 12.5 MG tablet Take 1 tablet (12.5 mg total) by mouth 2 (two) times daily with a meal. MUST MAKE APPT FOR FURTHER REFILLS 60 tablet 0  . cloNIDine (CATAPRES) 0.1 MG tablet Take 1 tablet (0.1 mg total) by mouth 2 (two) times daily. To lower blood pressure 60 tablet 5  . clopidogrel (PLAVIX) 75 MG tablet Take 1 tablet (75 mg total) by mouth daily. 90 tablet 3  . furosemide (LASIX) 40 MG tablet Take 1 tablet (40 mg total) by mouth daily. 30 tablet 6  . mometasone-formoterol (DULERA) 100-5 MCG/ACT AERO Inhale 2 puffs into the lungs 2 (two) times daily. 3 Inhaler 3  . pantoprazole (PROTONIX) 40 MG tablet Take 1 tablet (40 mg total) by mouth daily. MUST MAKE APPT FOR FURTHER REFILLS 30 tablet 0  . traZODone (DESYREL) 100 MG tablet Take 1 tablet (100 mg total) by mouth at bedtime as needed for sleep. 90 tablet 1   No facility-administered medications prior to visit.     Allergies  Allergen Reactions  . Ace Inhibitors Cough  . Codeine Itching    welts  . Morphine Itching    Welts        Objective:    BP (!) 211/109 (BP Location: Left Arm, Patient Position: Sitting, Cuff Size: Large)   Pulse 66   Temp 99.1 F (37.3 C) (Oral)   Ht _0  (1.727 m)   Wt 215 lb (97.5 kg)   SpO2 96%   BMI 32.69 kg/m  Wt Readings from Last 3 Encounters:  09/20/18 215 lb (97.5 kg)  01/28/18 208 lb (94.3 kg)  08/12/17 226 lb 6.4 oz (102.7 kg)    Physical Exam Vitals signs and nursing note reviewed.  Constitutional:      Appearance: She is well-developed.  HENT:     Head: Normocephalic and atraumatic.  Neck:     Musculoskeletal: Normal range of motion.  Cardiovascular:     Rate and Rhythm: Normal rate and regular rhythm.     Heart sounds: Normal heart sounds. No murmur. No friction rub. No gallop.   Pulmonary:     Effort: Pulmonary effort is normal. No  tachypnea or respiratory distress.     Breath sounds: Normal breath sounds. No decreased breath sounds, wheezing, rhonchi or  rales.  Chest:     Chest wall: No tenderness.  Abdominal:     General: Bowel sounds are normal.     Palpations: Abdomen is soft.  Musculoskeletal: Normal range of motion.     Right lower leg: Edema (2+ non pitting) present.     Left lower leg: Edema (2+ non pitting) present.  Skin:    General: Skin is warm and dry.  Neurological:     Mental Status: She is alert and oriented to person, place, and time.     Coordination: Coordination normal.  Psychiatric:        Behavior: Behavior normal. Behavior is cooperative.        Thought Content: Thought content normal.        Judgment: Judgment normal.         Patient has been counseled extensively about nutrition and exercise as well as the importance of adherence with medications and regular follow-up. The patient was given clear instructions to go to ER or return to medical center if symptoms don't improve, worsen or new problems develop. The patient verbalized understanding.   Follow-up: Return in about 4 weeks (around 10/18/2018) for BP recheck with luke.   Gildardo Pounds, FNP-BC [facility] and [place], [place]   09/21/2018, 5:44 PM

## 2018-09-21 ENCOUNTER — Encounter: Payer: Self-pay | Admitting: Nurse Practitioner

## 2018-09-21 LAB — CBC
Hematocrit: 41.6 % (ref 34.0–46.6)
Hemoglobin: 13.7 g/dL (ref 11.1–15.9)
MCH: 30.5 pg (ref 26.6–33.0)
MCHC: 32.9 g/dL (ref 31.5–35.7)
MCV: 93 fL (ref 79–97)
Platelets: 187 10*3/uL (ref 150–450)
RBC: 4.49 x10E6/uL (ref 3.77–5.28)
RDW: 14.1 % (ref 11.7–15.4)
WBC: 5 10*3/uL (ref 3.4–10.8)

## 2018-09-21 LAB — CMP14+EGFR
ALT: 12 IU/L (ref 0–32)
AST: 14 IU/L (ref 0–40)
Albumin/Globulin Ratio: 2.1 (ref 1.2–2.2)
Albumin: 4.5 g/dL (ref 3.8–4.8)
Alkaline Phosphatase: 119 IU/L — ABNORMAL HIGH (ref 39–117)
BUN/Creatinine Ratio: 11 — ABNORMAL LOW (ref 12–28)
BUN: 12 mg/dL (ref 8–27)
Bilirubin Total: 0.4 mg/dL (ref 0.0–1.2)
CO2: 23 mmol/L (ref 20–29)
Calcium: 9.4 mg/dL (ref 8.7–10.3)
Chloride: 100 mmol/L (ref 96–106)
Creatinine, Ser: 1.13 mg/dL — ABNORMAL HIGH (ref 0.57–1.00)
GFR calc Af Amer: 58 mL/min/{1.73_m2} — ABNORMAL LOW (ref >59)
GFR calc non Af Amer: 50 mL/min/{1.73_m2} — ABNORMAL LOW (ref >59)
Globulin, Total: 2.1 g/dL (ref 1.5–4.5)
Glucose: 102 mg/dL — ABNORMAL HIGH (ref 65–99)
Potassium: 5 mmol/L (ref 3.5–5.2)
Sodium: 139 mmol/L (ref 134–144)
Total Protein: 6.6 g/dL (ref 6.0–8.5)

## 2018-09-21 LAB — LIPID PANEL
Chol/HDL Ratio: 3.7 ratio (ref 0.0–4.4)
Cholesterol, Total: 159 mg/dL (ref 100–199)
HDL: 43 mg/dL (ref >39)
LDL Calculated: 58 mg/dL (ref 0–99)
Triglycerides: 291 mg/dL — ABNORMAL HIGH (ref 0–149)
VLDL Cholesterol Cal: 58 mg/dL — ABNORMAL HIGH (ref 5–40)

## 2018-09-22 ENCOUNTER — Other Ambulatory Visit: Payer: Self-pay | Admitting: Pharmacist

## 2018-09-22 DIAGNOSIS — J449 Chronic obstructive pulmonary disease, unspecified: Secondary | ICD-10-CM

## 2018-09-22 MED ORDER — BUDESONIDE-FORMOTEROL FUMARATE 80-4.5 MCG/ACT IN AERO: 2.0000 | INHALATION_SPRAY | Freq: Two times a day (BID) | RESPIRATORY_TRACT | 2 refills | Status: DC

## 2018-09-22 NOTE — Progress Notes (Signed)
Per auto-sub policy. Will switch from [place] to Symbicort per insurance preference.

## 2018-10-20 ENCOUNTER — Telehealth: Payer: Self-pay | Admitting: Cardiovascular Disease

## 2018-10-20 NOTE — Telephone Encounter (Signed)
LVM for patient to call and schedule Dr. Gwenlyn Found appt.

## 2018-10-21 ENCOUNTER — Other Ambulatory Visit: Payer: Self-pay | Admitting: Nurse Practitioner

## 2018-10-21 ENCOUNTER — Other Ambulatory Visit: Payer: Self-pay | Admitting: Family Medicine

## 2018-10-21 DIAGNOSIS — K219 Gastro-esophageal reflux disease without esophagitis: Secondary | ICD-10-CM

## 2019-01-11 ENCOUNTER — Ambulatory Visit: Payer: Medicaid Other | Admitting: Cardiovascular Disease

## 2019-01-24 ENCOUNTER — Other Ambulatory Visit: Payer: Self-pay | Admitting: Family Medicine

## 2019-01-24 DIAGNOSIS — I2510 Atherosclerotic heart disease of native coronary artery without angina pectoris: Secondary | ICD-10-CM

## 2019-01-26 ENCOUNTER — Other Ambulatory Visit: Payer: Self-pay | Admitting: Family Medicine

## 2019-01-26 DIAGNOSIS — I2510 Atherosclerotic heart disease of native coronary artery without angina pectoris: Secondary | ICD-10-CM

## 2019-06-21 ENCOUNTER — Other Ambulatory Visit: Payer: Self-pay | Admitting: Nurse Practitioner

## 2019-06-21 DIAGNOSIS — I5032 Chronic diastolic (congestive) heart failure: Secondary | ICD-10-CM

## 2019-06-21 DIAGNOSIS — I10 Essential (primary) hypertension: Secondary | ICD-10-CM

## 2019-06-23 ENCOUNTER — Other Ambulatory Visit: Payer: Self-pay | Admitting: Nurse Practitioner

## 2019-06-23 DIAGNOSIS — E785 Hyperlipidemia, unspecified: Secondary | ICD-10-CM

## 2019-06-23 DIAGNOSIS — I10 Essential (primary) hypertension: Secondary | ICD-10-CM

## 2019-06-28 ENCOUNTER — Other Ambulatory Visit: Payer: Self-pay | Admitting: Pharmacist

## 2019-06-28 ENCOUNTER — Other Ambulatory Visit: Payer: Self-pay | Admitting: Family Medicine

## 2019-06-28 ENCOUNTER — Telehealth: Payer: Self-pay | Admitting: Nurse Practitioner

## 2019-06-28 DIAGNOSIS — E785 Hyperlipidemia, unspecified: Secondary | ICD-10-CM

## 2019-06-28 DIAGNOSIS — I2510 Atherosclerotic heart disease of native coronary artery without angina pectoris: Secondary | ICD-10-CM

## 2019-06-28 DIAGNOSIS — I5032 Chronic diastolic (congestive) heart failure: Secondary | ICD-10-CM

## 2019-06-28 DIAGNOSIS — I10 Essential (primary) hypertension: Secondary | ICD-10-CM

## 2019-06-28 MED ORDER — ATORVASTATIN CALCIUM 80 MG PO TABS: ORAL_TABLET | ORAL | 0 refills | Status: DC

## 2019-06-28 MED ORDER — CLOPIDOGREL BISULFATE 75 MG PO TABS: 75.0000 mg | ORAL_TABLET | Freq: Every day | ORAL | 0 refills | Status: DC

## 2019-06-28 MED ORDER — CARVEDILOL 12.5 MG PO TABS: 12.5000 mg | ORAL_TABLET | Freq: Two times a day (BID) | ORAL | 0 refills | Status: DC

## 2019-07-06 ENCOUNTER — Other Ambulatory Visit: Payer: Self-pay

## 2019-07-06 ENCOUNTER — Encounter: Payer: Self-pay | Admitting: Nurse Practitioner

## 2019-07-06 ENCOUNTER — Ambulatory Visit: Payer: Medicaid Other | Attending: Nurse Practitioner | Admitting: Nurse Practitioner

## 2019-07-06 VITALS — BP 211/102 | HR 68 | Temp 98.1°F | Ht 68.0 in | Wt 203.0 lb

## 2019-07-06 DIAGNOSIS — Z951 Presence of aortocoronary bypass graft: Secondary | ICD-10-CM | POA: Insufficient documentation

## 2019-07-06 DIAGNOSIS — Z7982 Long term (current) use of aspirin: Secondary | ICD-10-CM | POA: Diagnosis not present

## 2019-07-06 DIAGNOSIS — I739 Peripheral vascular disease, unspecified: Secondary | ICD-10-CM | POA: Diagnosis not present

## 2019-07-06 DIAGNOSIS — F419 Anxiety disorder, unspecified: Secondary | ICD-10-CM | POA: Insufficient documentation

## 2019-07-06 DIAGNOSIS — N1831 Chronic kidney disease, stage 3a: Secondary | ICD-10-CM | POA: Diagnosis not present

## 2019-07-06 DIAGNOSIS — I5032 Chronic diastolic (congestive) heart failure: Secondary | ICD-10-CM | POA: Diagnosis present

## 2019-07-06 DIAGNOSIS — Z79899 Other long term (current) drug therapy: Secondary | ICD-10-CM | POA: Insufficient documentation

## 2019-07-06 DIAGNOSIS — Z760 Encounter for issue of repeat prescription: Secondary | ICD-10-CM | POA: Insufficient documentation

## 2019-07-06 DIAGNOSIS — M1990 Unspecified osteoarthritis, unspecified site: Secondary | ICD-10-CM | POA: Insufficient documentation

## 2019-07-06 DIAGNOSIS — I2510 Atherosclerotic heart disease of native coronary artery without angina pectoris: Secondary | ICD-10-CM | POA: Diagnosis not present

## 2019-07-06 DIAGNOSIS — R7303 Prediabetes: Secondary | ICD-10-CM | POA: Insufficient documentation

## 2019-07-06 DIAGNOSIS — Z7902 Long term (current) use of antithrombotics/antiplatelets: Secondary | ICD-10-CM | POA: Insufficient documentation

## 2019-07-06 DIAGNOSIS — K219 Gastro-esophageal reflux disease without esophagitis: Secondary | ICD-10-CM

## 2019-07-06 DIAGNOSIS — Z7901 Long term (current) use of anticoagulants: Secondary | ICD-10-CM | POA: Diagnosis not present

## 2019-07-06 DIAGNOSIS — E782 Mixed hyperlipidemia: Secondary | ICD-10-CM | POA: Diagnosis not present

## 2019-07-06 DIAGNOSIS — Z1211 Encounter for screening for malignant neoplasm of colon: Secondary | ICD-10-CM | POA: Insufficient documentation

## 2019-07-06 DIAGNOSIS — I130 Hypertensive heart and chronic kidney disease with heart failure and stage 1 through stage 4 chronic kidney disease, or unspecified chronic kidney disease: Secondary | ICD-10-CM | POA: Insufficient documentation

## 2019-07-06 DIAGNOSIS — I10 Essential (primary) hypertension: Secondary | ICD-10-CM

## 2019-07-06 DIAGNOSIS — F17210 Nicotine dependence, cigarettes, uncomplicated: Secondary | ICD-10-CM | POA: Insufficient documentation

## 2019-07-06 DIAGNOSIS — J449 Chronic obstructive pulmonary disease, unspecified: Secondary | ICD-10-CM | POA: Insufficient documentation

## 2019-07-06 LAB — POCT GLYCOSYLATED HEMOGLOBIN (HGB A1C): Hemoglobin A1C: 5.4 % (ref 4.0–5.6)

## 2019-07-06 LAB — GLUCOSE, POCT (MANUAL RESULT ENTRY): POC Glucose: 114 mg/dl — AB (ref 70–99)

## 2019-07-06 MED ORDER — CLONIDINE HCL 0.1 MG PO TABS: 0.1000 mg | ORAL_TABLET | Freq: Two times a day (BID) | ORAL | 2 refills | Status: DC

## 2019-07-06 MED ORDER — CLOPIDOGREL BISULFATE 75 MG PO TABS: 75.0000 mg | ORAL_TABLET | Freq: Every day | ORAL | 1 refills | Status: AC

## 2019-07-06 MED ORDER — CARVEDILOL 12.5 MG PO TABS: 12.5000 mg | ORAL_TABLET | Freq: Two times a day (BID) | ORAL | 1 refills | Status: DC

## 2019-07-06 MED ORDER — BUDESONIDE-FORMOTEROL FUMARATE 80-4.5 MCG/ACT IN AERO: 2.0000 | INHALATION_SPRAY | Freq: Two times a day (BID) | RESPIRATORY_TRACT | 2 refills | Status: DC

## 2019-07-06 MED ORDER — LOSARTAN POTASSIUM 25 MG PO TABS: 25.0000 mg | ORAL_TABLET | Freq: Every day | ORAL | 2 refills | Status: DC

## 2019-07-06 MED ORDER — BUSPIRONE HCL 10 MG PO TABS: 10.0000 mg | ORAL_TABLET | Freq: Three times a day (TID) | ORAL | 2 refills | Status: DC

## 2019-07-06 MED ORDER — PANTOPRAZOLE SODIUM 40 MG PO TBEC: 40.0000 mg | DELAYED_RELEASE_TABLET | Freq: Every day | ORAL | 1 refills | Status: DC

## 2019-07-06 MED ORDER — FUROSEMIDE 40 MG PO TABS: 40.0000 mg | ORAL_TABLET | Freq: Every day | ORAL | 6 refills | Status: DC

## 2019-07-06 MED ORDER — NITROGLYCERIN 0.4 MG SL SUBL: 0.4000 mg | SUBLINGUAL_TABLET | SUBLINGUAL | 3 refills | Status: DC | PRN

## 2019-07-06 MED ORDER — ATORVASTATIN CALCIUM 80 MG PO TABS: 80.0000 mg | ORAL_TABLET | Freq: Every day | ORAL | 1 refills | Status: DC

## 2019-07-06 NOTE — Progress Notes (Signed)
Assessment & Plan:  Dortha was seen today for medication refill.  Diagnoses and all orders for this visit:  Essential hypertension -     CMP14+EGFR -     carvedilol (COREG) 12.5 MG tablet; Take 1 tablet (12.5 mg total) by mouth 2 (two) times daily with a meal. -     cloNIDine (CATAPRES) 0.1 MG tablet; Take 1 tablet (0.1 mg total) by mouth 2 (two) times daily. To lower blood pressure -     furosemide (LASIX) 40 MG tablet; Take 1 tablet (40 mg total) by mouth daily. -     losartan (COZAAR) 25 MG tablet; Take 1 tablet (25 mg total) by mouth daily.  Chronic diastolic congestive heart failure (HCC) -     carvedilol (COREG) 12.5 MG tablet; Take 1 tablet (12.5 mg total) by mouth 2 (two) times daily with a meal. -     furosemide (LASIX) 40 MG tablet; Take 1 tablet (40 mg total) by mouth daily. Continue all antihypertensives as prescribed.  Remember to bring in your blood pressure log with you for your follow up appointment.  DASH/Mediterranean Diets are healthier choices for HTN.   Mixed hyperlipidemia -     Lipid panel INSTRUCTIONS: Work on a low fat, heart healthy diet and participate in regular aerobic exercise program by working out at least 150 minutes per week; 5 days a week-30 minutes per day. Avoid red meat/beef/steak,  fried foods. junk foods, sodas, sugary drinks, unhealthy snacking, alcohol and smoking.  Drink at least 80 oz of water per day and monitor your carbohydrate intake daily.    Prediabetes -     Glucose (CBG) -     HgB A1c -     CMP14+EGFR  Colon cancer screening -     Fecal occult blood, imunochemical([facility])  Stage 3a chronic kidney disease -     CBC  Chronic obstructive pulmonary disease, unspecified COPD type (HCC) -     budesonide-formoterol (SYMBICORT) 80-4.5 MCG/ACT inhaler; Inhale 2 puffs into the lungs 2 (two) times daily.  Coronary artery disease involving native coronary artery of native heart without angina pectoris -     atorvastatin  (LIPITOR) 80 MG tablet; Take 1 tablet (80 mg total) by mouth daily. TAKE 1 TABLET BY MOUTH DAILY AT 6 PM -     clopidogrel (PLAVIX) 75 MG tablet; Take 1 tablet (75 mg total) by mouth daily. -     nitroGLYCERIN (NITROSTAT) 0.4 MG SL tablet; Place 1 tablet (0.4 mg total) under the tongue every 5 (five) minutes x 3 doses as needed for chest pain.  GERD without esophagitis -     pantoprazole (PROTONIX) 40 MG tablet; Take 1 tablet (40 mg total) by mouth daily. INSTRUCTIONS: Avoid GERD Triggers: acidic, spicy or fried foods, caffeine, coffee, sodas,  alcohol and chocolate.   Anxiety -     busPIRone (BUSPAR) 10 MG tablet; Take 1 tablet (10 mg total) by mouth 3 (three) times daily.    Patient has been counseled on age-appropriate routine health concerns for screening and prevention. These are reviewed and up-to-date. Referrals have been placed accordingly. Immunizations are up-to-date or declined.    Subjective:   Chief Complaint  Patient presents with  . Medication Refill    Pt. is requesting medication refills.    HPI [name] 69 y.o. female presents to office today for medication refills.  Her blood pressure is extremely high.  She was urged to go to the emergency room for further  evaluation and treatment however she adamantly declined.  She is asymptomatic and denies any chest pain, shortness of breath, palpitations or severe headache.  She states she is very stressed and anxious. Finances. She is now having to move out of the house that she has been renting for the past 12 years as the owners of the house are now selling.  ESSENTIAL HYPERTENSION Blood pressure is Out of control today. Long standing history of non adherence. She does not have her medications here with her today so I am unable to verify which medications she is actually taking. Currently prescribed: carvedilol 12.5 mg BID, clonidine 0.1 mg BID, losartan 25 mg daily and furosemide for CHF. She has not seen cardiology in  years. I referred her however she canceled her appt. Denies chest pain, shortness of breath, palpitations, lightheadedness, dizziness, headaches or BLE edema.  BP Readings from Last 3 Encounters:  07/06/19 (!) 211/102  09/20/18 (!) 211/109  01/28/18 (!) 174/82   Dyslipidemia LDL at goal. Taking atorvastatin 80 mg. Denies any statin intolerance.  Lab Results  Component Value Date   LDLCALC 58 09/20/2018    COPD She continues smoke cigarettes. Not ready to quit. Current inhaler: Symbicort.   Review of Systems  Constitutional: Negative for fever, malaise/fatigue and weight loss.  HENT: Negative.  Negative for nosebleeds.   Eyes: Negative.  Negative for blurred vision, double vision and photophobia.  Respiratory: Negative.  Negative for cough and shortness of breath.   Cardiovascular: Negative.  Negative for chest pain, palpitations and leg swelling.  Gastrointestinal: Negative.  Negative for heartburn, nausea and vomiting.  Musculoskeletal: Positive for joint pain. Negative for myalgias.  Neurological: Negative.  Negative for dizziness, focal weakness, seizures and headaches.  Psychiatric/Behavioral: Negative for suicidal ideas. The patient is nervous/anxious.     Past Medical History:  Diagnosis Date  . ASCVD (arteriosclerotic cardiovascular disease)    CABG in 2006   . Cerebrovascular disease   . Coronary artery disease   . Diverticulitis   . DJD (degenerative joint disease) of cervical spine   . GERD (gastroesophageal reflux disease)   . H. pylori infection   . Hepatitis   . Hypertension   . Migraine headache   . PVD (peripheral vascular disease) ([place])   . Shingles 11/21/2015  . Tobacco abuse     Past Surgical History:  Procedure Laterality Date  . ABDOMINAL HYSTERECTOMY    . CAROTID ENDARTERECTOMY     Right  . CHOLECYSTECTOMY    . COLONOSCOPY    . CORONARY ARTERY BYPASS GRAFT  2006   RIMA to the RCA, LIMA to the LAD, SVG to the circumflex  . HEMORRHOID SURGERY     . LEFT HEART CATH Bilateral 04/17/2013   Procedure: LEFT HEART CATH;  Surgeon: Lorretta Harp, MD;  Location: [facility] CATH LAB;  Service: Cardiovascular;  Laterality: Bilateral;  . LEFT HEART CATHETERIZATION WITH CORONARY ANGIOGRAM N/A 09/27/2012   Procedure: LEFT HEART CATHETERIZATION WITH CORONARY ANGIOGRAM;  Surgeon: Lorretta Harp, MD;  Location: [facility] CATH LAB;  Service: Cardiovascular;  Laterality: N/A;    Family History  Problem Relation Age of Onset  . Pneumonia Mother   . Coronary artery disease Father   . Heart disease Sister   . Colon cancer Maternal Aunt        Great aunt, rectal  . Diabetes Maternal Uncle   . Heart disease Maternal Uncle   . Breast cancer Maternal Grandmother   . Heart disease Other  Father's side of family    Social History Reviewed with no changes to be made today.   Outpatient Medications Prior to Visit  Medication Sig Dispense Refill  . aspirin 81 MG tablet Take 1 tablet (81 mg total) by mouth daily. Reported on 06/07/2015 90 tablet 3  . atorvastatin (LIPITOR) 80 MG tablet TAKE 1 TABLET BY MOUTH DAILY AT 6 PM 30 tablet 0  . budesonide-formoterol (SYMBICORT) 80-4.5 MCG/ACT inhaler Inhale 2 puffs into the lungs 2 (two) times daily. 1 Inhaler 2  . carvedilol (COREG) 12.5 MG tablet Take 1 tablet (12.5 mg total) by mouth 2 (two) times daily with a meal. MUST MAKE APPT FOR FURTHER REFILLS 60 tablet 0  . clopidogrel (PLAVIX) 75 MG tablet Take 1 tablet (75 mg total) by mouth daily. Please make PCP appointment. 30 tablet 0  . furosemide (LASIX) 40 MG tablet Take 1 tablet (40 mg total) by mouth daily. 30 tablet 6  . nitroGLYCERIN (NITROSTAT) 0.4 MG SL tablet Place 1 tablet (0.4 mg total) under the tongue every 5 (five) minutes x 3 doses as needed for chest pain. 25 tablet 3  . pantoprazole (PROTONIX) 40 MG tablet TAKE 1 TABLET(40 MG) BY MOUTH DAILY 90 tablet 0  . cloNIDine (CATAPRES) 0.1 MG tablet Take 1 tablet (0.1 mg total) by mouth 2 (two) times daily. To  lower blood pressure 180 tablet 2  . losartan (COZAAR) 25 MG tablet Take 1 tablet (25 mg total) by mouth daily. 90 tablet 2  . traZODone (DESYREL) 100 MG tablet Take 1 tablet (100 mg total) by mouth at bedtime as needed for sleep. 90 tablet 1   No facility-administered medications prior to visit.    Allergies  Allergen Reactions  . Ace Inhibitors Cough  . Codeine Itching    welts  . Morphine Itching    Welts        Objective:    BP (!) 211/102 (BP Location: Left Arm, Patient Position: Sitting, Cuff Size: Normal)   Pulse 68   Temp 98.1 F (36.7 C) (Temporal)   Ht 5' 8"  (1.727 m)   Wt 203 lb (92.1 kg)   SpO2 98%   BMI 30.87 kg/m  Wt Readings from Last 3 Encounters:  07/06/19 203 lb (92.1 kg)  09/20/18 215 lb (97.5 kg)  01/28/18 208 lb (94.3 kg)    Physical Exam Vitals and nursing note reviewed.  Constitutional:      Appearance: She is well-developed.  HENT:     Head: Normocephalic and atraumatic.  Cardiovascular:     Rate and Rhythm: Normal rate and regular rhythm.     Heart sounds: Normal heart sounds. No murmur. No friction rub. No gallop.   Pulmonary:     Effort: Pulmonary effort is normal. No tachypnea or respiratory distress.     Breath sounds: Normal breath sounds. No decreased breath sounds, wheezing, rhonchi or rales.  Chest:     Chest wall: No tenderness.  Abdominal:     General: Bowel sounds are normal.     Palpations: Abdomen is soft.  Musculoskeletal:        General: Normal range of motion.     Cervical back: Normal range of motion.  Skin:    General: Skin is warm and dry.  Neurological:     Mental Status: She is alert and oriented to person, place, and time.     Coordination: Coordination normal.  Psychiatric:        Behavior: Behavior normal. Behavior is cooperative.  Thought Content: Thought content normal.        Judgment: Judgment normal.          Patient has been counseled extensively about nutrition and exercise as well as  the importance of adherence with medications and regular follow-up. The patient was given clear instructions to go to ER or return to medical center if symptoms don't improve, worsen or new problems develop. The patient verbalized understanding.   Follow-up: Return in about 3 months (around 10/06/2019).   Gildardo Pounds, FNP-BC [facility] and [place], [place]   07/06/2019, 8:20 PM

## 2019-07-07 LAB — CMP14+EGFR
ALT: 16 IU/L (ref 0–32)
AST: 16 IU/L (ref 0–40)
Albumin/Globulin Ratio: 1.8 (ref 1.2–2.2)
Albumin: 4.5 g/dL (ref 3.8–4.8)
Alkaline Phosphatase: 126 IU/L — ABNORMAL HIGH (ref 48–121)
BUN/Creatinine Ratio: 14 (ref 12–28)
BUN: 19 mg/dL (ref 8–27)
Bilirubin Total: 0.4 mg/dL (ref 0.0–1.2)
CO2: 23 mmol/L (ref 20–29)
Calcium: 9.4 mg/dL (ref 8.7–10.3)
Chloride: 104 mmol/L (ref 96–106)
Creatinine, Ser: 1.33 mg/dL — ABNORMAL HIGH (ref 0.57–1.00)
GFR calc Af Amer: 47 mL/min/{1.73_m2} — ABNORMAL LOW (ref >59)
GFR calc non Af Amer: 41 mL/min/{1.73_m2} — ABNORMAL LOW (ref >59)
Globulin, Total: 2.5 g/dL (ref 1.5–4.5)
Glucose: 90 mg/dL (ref 65–99)
Potassium: 4.6 mmol/L (ref 3.5–5.2)
Sodium: 141 mmol/L (ref 134–144)
Total Protein: 7 g/dL (ref 6.0–8.5)

## 2019-07-07 LAB — CBC
Hematocrit: 43.3 % (ref 34.0–46.6)
Hemoglobin: 14.8 g/dL (ref 11.1–15.9)
MCH: 31.2 pg (ref 26.6–33.0)
MCHC: 34.2 g/dL (ref 31.5–35.7)
MCV: 91 fL (ref 79–97)
Platelets: 169 10*3/uL (ref 150–450)
RBC: 4.75 x10E6/uL (ref 3.77–5.28)
RDW: 14.2 % (ref 11.7–15.4)
WBC: 5.3 10*3/uL (ref 3.4–10.8)

## 2019-07-07 LAB — LIPID PANEL
Chol/HDL Ratio: 4.2 ratio (ref 0.0–4.4)
Cholesterol, Total: 168 mg/dL (ref 100–199)
HDL: 40 mg/dL (ref >39)
LDL Chol Calc (NIH): 90 mg/dL (ref 0–99)
Triglycerides: 226 mg/dL — ABNORMAL HIGH (ref 0–149)
VLDL Cholesterol Cal: 38 mg/dL (ref 5–40)

## 2019-07-24 ENCOUNTER — Other Ambulatory Visit: Payer: Self-pay | Admitting: Family Medicine

## 2019-07-24 DIAGNOSIS — I5032 Chronic diastolic (congestive) heart failure: Secondary | ICD-10-CM

## 2019-07-24 DIAGNOSIS — I10 Essential (primary) hypertension: Secondary | ICD-10-CM

## 2019-10-07 ENCOUNTER — Ambulatory Visit: Payer: Medicaid Other | Attending: Nurse Practitioner | Admitting: Nurse Practitioner

## 2019-10-07 ENCOUNTER — Encounter: Payer: Self-pay | Admitting: Nurse Practitioner

## 2019-10-07 DIAGNOSIS — I10 Essential (primary) hypertension: Secondary | ICD-10-CM

## 2019-10-07 DIAGNOSIS — Z87891 Personal history of nicotine dependence: Secondary | ICD-10-CM | POA: Diagnosis not present

## 2019-10-07 DIAGNOSIS — Z951 Presence of aortocoronary bypass graft: Secondary | ICD-10-CM | POA: Insufficient documentation

## 2019-10-07 DIAGNOSIS — I110 Hypertensive heart disease with heart failure: Secondary | ICD-10-CM | POA: Insufficient documentation

## 2019-10-07 DIAGNOSIS — M1990 Unspecified osteoarthritis, unspecified site: Secondary | ICD-10-CM | POA: Insufficient documentation

## 2019-10-07 DIAGNOSIS — K219 Gastro-esophageal reflux disease without esophagitis: Secondary | ICD-10-CM | POA: Diagnosis not present

## 2019-10-07 DIAGNOSIS — I739 Peripheral vascular disease, unspecified: Secondary | ICD-10-CM | POA: Insufficient documentation

## 2019-10-07 DIAGNOSIS — J449 Chronic obstructive pulmonary disease, unspecified: Secondary | ICD-10-CM | POA: Insufficient documentation

## 2019-10-07 DIAGNOSIS — Z8249 Family history of ischemic heart disease and other diseases of the circulatory system: Secondary | ICD-10-CM | POA: Diagnosis not present

## 2019-10-07 DIAGNOSIS — I2510 Atherosclerotic heart disease of native coronary artery without angina pectoris: Secondary | ICD-10-CM | POA: Insufficient documentation

## 2019-10-07 DIAGNOSIS — Z79899 Other long term (current) drug therapy: Secondary | ICD-10-CM | POA: Insufficient documentation

## 2019-10-07 DIAGNOSIS — I5032 Chronic diastolic (congestive) heart failure: Secondary | ICD-10-CM | POA: Diagnosis not present

## 2019-10-07 NOTE — Progress Notes (Signed)
Virtual Visit via Telephone Note Due to national recommendations of social distancing due to COVID 19, telehealth visit is felt to be most appropriate for this patient at this time.  I discussed the limitations, risks, security and privacy concerns of performing an evaluation and management service by telephone and the availability of in person appointments. I also discussed with the patient that there may be a patient responsible charge related to this service. The patient expressed understanding and agreed to proceed.    I connected with [name] on 10/07/19  at  11:10 AM EDT  EDT by telephone and verified that I am speaking with the correct person using two identifiers.   Consent I discussed the limitations, risks, security and privacy concerns of performing an evaluation and management service by telephone and the availability of in person appointments. I also discussed with the patient that there may be a patient responsible charge related to this service. The patient expressed understanding and agreed to proceed.   Location of Patient: Private  Residence    [facility] of Provider: [facility]    Persons participating in Telemedicine visit: Misty Denver FNP-BC YY [place] CMA [name]    History of Present Illness: Telemedicine visit for: F/U  Essential Hypertension Notes home reading this morning: 140/74 HR 45. Average readings: 132-183/71-88 50-58. She endorses medication adherence taking Carvedilol 12.5 mg BID, losartan 25 mg daily and coreg 12.5 mg BID. She is also taking furosemide 40 mg daily for CHF. Denies chest pain, shortness of breath, palpitations, lightheadedness, dizziness, headaches or BLE edema.  BP Readings from Last 3 Encounters:  07/06/19 (!) 211/102  09/20/18 (!) 211/109  01/28/18 (!) 174/82   COPD Denies cough, shortness of breath, sputum production or wheezing. Only uses Symbicort as needed instead of daily as prescribed.     Past Medical History:  Diagnosis Date  . ASCVD (arteriosclerotic cardiovascular disease)    CABG in 2006   . Cerebrovascular disease   . Coronary artery disease   . Diverticulitis   . DJD (degenerative joint disease) of cervical spine   . GERD (gastroesophageal reflux disease)   . H. pylori infection   . Hepatitis   . Hypertension   . Migraine headache   . PVD (peripheral vascular disease) (HCC)   . Shingles 11/21/2015  . Tobacco abuse     Past Surgical History:  Procedure Laterality Date  . ABDOMINAL HYSTERECTOMY    . CAROTID ENDARTERECTOMY     Right  . CHOLECYSTECTOMY    . COLONOSCOPY    . CORONARY ARTERY BYPASS GRAFT  2006   RIMA to the RCA, LIMA to the LAD, SVG to the circumflex  . HEMORRHOID SURGERY    . LEFT HEART CATH Bilateral 04/17/2013   Procedure: LEFT HEART CATH;  Surgeon: [name], MD;  Location[facility] LAB;  Service: Cardiovascular;  Laterality: Bilateral;  . LEFT HEART CATHETERIZATION WITH CORONARY ANGIOGRAM N/A 09/27/2012   Procedure: LEFT HEART CATHETERIZATION WITH CORONARY ANGIOGRAM;  Surgeon: [name], MD;  Location: [facility] CATH LAB;  Service: Cardiovascular;  Laterality: N/A;    Family History  Problem Relation Age of Onset  . Pneumonia Mother   . Coronary artery disease Father   . Heart disease Sister   . Colon cancer Maternal Aunt        Great aunt, rectal  . Diabetes Maternal Uncle   . Heart disease Maternal Uncle   . Breast cancer Maternal Grandmother   .  Heart disease Other        Father's side of family    Social History   Socioeconomic History  . Marital status: Single    Spouse name: Not on file  . Number of children: Not on file  . Years of education: Not on file  . Highest education level: Not on file  Occupational History  . Occupation: Pensions consultant at [employer]    Employer: [employer]  Tobacco Use  . Smoking status: Former Smoker    Packs/day: 0.50  . Smokeless tobacco: Former Neurosurgeon     Quit date: 04/17/2013  . Tobacco comment: 1/2 pack per day since 2005, 40 pack years  Vaping Use  . Vaping Use: Never used  Substance and Sexual Activity  . Alcohol use: No  . Drug use: No  . Sexual activity: Yes  Other Topics Concern  . Not on file  Social History Narrative   Divorced, no children   Social Determinants of Health   Financial Resource Strain:   . Difficulty of Paying Living Expenses: Not on file  Food Insecurity:   . Worried About Programme researcher, broadcasting/film/video in the Last Year: Not on file  . Ran Out of Food in the Last Year: Not on file  Transportation Needs:   . Lack of Transportation (Medical): Not on file  . Lack of Transportation (Non-Medical): Not on file  Physical Activity:   . Days of Exercise per Week: Not on file  . Minutes of Exercise per Session: Not on file  Stress:   . Feeling of Stress : Not on file  Social Connections:   . Frequency of Communication with Friends and Family: Not on file  . Frequency of Social Gatherings with Friends and Family: Not on file  . Attends Religious Services: Not on file  . Active Member of Clubs or Organizations: Not on file  . Attends Banker Meetings: Not on file  . Marital Status: Not on file     Observations/Objective: Awake, alert and oriented x 3   ROS  Assessment and Plan: [name] was seen today for follow-up.  Diagnoses and all orders for this visit:  Essential hypertension Continue all antihypertensives as prescribed.  Remember to bring in your blood pressure log with you for your follow up appointment.  DASH/Mediterranean Diets are healthier choices for HTN.    Chronic diastolic congestive heart failure (HCC) -     Ambulatory referral to Cardiology DASH DIET AVOID Excessive sodium in your diet     Follow Up Instructions Return in about 3 months (around 01/06/2020).     I discussed the assessment and treatment plan with the patient. The patient was provided an opportunity to ask  questions and all were answered. The patient agreed with the plan and demonstrated an understanding of the instructions.   The patient was advised to call back or seek an in-person evaluation if the symptoms worsen or if the condition fails to improve as anticipated.  I provided 16 minutes of non-face-to-face time during this encounter including median intraservice time, reviewing previous notes, labs, imaging, medications and explaining diagnosis and management.  Claiborne Rigg, FNP-BC

## 2019-10-17 ENCOUNTER — Ambulatory Visit: Payer: Medicaid Other | Attending: Nurse Practitioner

## 2019-10-17 ENCOUNTER — Other Ambulatory Visit: Payer: Self-pay | Admitting: Nurse Practitioner

## 2019-10-17 ENCOUNTER — Other Ambulatory Visit: Payer: Self-pay

## 2019-10-17 DIAGNOSIS — R7989 Other specified abnormal findings of blood chemistry: Secondary | ICD-10-CM

## 2019-10-17 DIAGNOSIS — I10 Essential (primary) hypertension: Secondary | ICD-10-CM

## 2019-10-18 LAB — BASIC METABOLIC PANEL
BUN/Creatinine Ratio: 13 (ref 12–28)
BUN: 17 mg/dL (ref 8–27)
CO2: 21 mmol/L (ref 20–29)
Calcium: 9.3 mg/dL (ref 8.7–10.3)
Chloride: 104 mmol/L (ref 96–106)
Creatinine, Ser: 1.28 mg/dL — ABNORMAL HIGH (ref 0.57–1.00)
GFR calc Af Amer: 49 mL/min/{1.73_m2} — ABNORMAL LOW (ref >59)
GFR calc non Af Amer: 43 mL/min/{1.73_m2} — ABNORMAL LOW (ref >59)
Glucose: 94 mg/dL (ref 65–99)
Potassium: 4.8 mmol/L (ref 3.5–5.2)
Sodium: 144 mmol/L (ref 134–144)

## 2019-11-01 ENCOUNTER — Other Ambulatory Visit: Payer: Self-pay | Admitting: Family Medicine

## 2019-11-01 ENCOUNTER — Other Ambulatory Visit: Payer: Self-pay | Admitting: Nurse Practitioner

## 2019-11-01 DIAGNOSIS — I10 Essential (primary) hypertension: Secondary | ICD-10-CM

## 2019-11-01 DIAGNOSIS — I2510 Atherosclerotic heart disease of native coronary artery without angina pectoris: Secondary | ICD-10-CM

## 2019-11-01 DIAGNOSIS — J449 Chronic obstructive pulmonary disease, unspecified: Secondary | ICD-10-CM

## 2019-11-01 DIAGNOSIS — I5032 Chronic diastolic (congestive) heart failure: Secondary | ICD-10-CM

## 2019-11-01 NOTE — Telephone Encounter (Signed)
Requested Prescriptions  Pending Prescriptions Disp Refills  . SYMBICORT 80-4.5 MCG/ACT inhaler [Pharmacy Med Name: SYMBICORT 80/4. (120  ORAL INH)] 10.2 g 6    Sig: INHALE 2 PUFFS INTO THE LUNGS TWICE DAILY     Pulmonology:  Combination Products Passed - 11/01/2019  3:09 AM      Passed - Valid encounter within last 12 months    Recent Outpatient Visits          3 weeks ago Essential hypertension   [facility] [place], Shea Stakes, NP   3 months ago Essential hypertension   [place] [place], Shea Stakes, NP   1 year ago Essential hypertension   [facility] [place], Shea Stakes, NP   1 year ago Essential hypertension   [place] [place], [place], MD   2 years ago Essential hypertension   [facility] Claiborne Rigg, NP      Future Appointments            In 1 week Claiborne Rigg, NP [facility]   In 2 weeks Lyn Records, MD [facility]

## 2019-11-08 ENCOUNTER — Encounter: Payer: Self-pay | Admitting: Nurse Practitioner

## 2019-11-08 ENCOUNTER — Other Ambulatory Visit: Payer: Self-pay

## 2019-11-08 ENCOUNTER — Ambulatory Visit: Payer: Medicaid Other | Attending: Nurse Practitioner | Admitting: Nurse Practitioner

## 2019-11-08 VITALS — BP 187/91 | HR 59 | Temp 97.7°F | Ht 68.0 in | Wt 197.0 lb

## 2019-11-08 DIAGNOSIS — R7303 Prediabetes: Secondary | ICD-10-CM

## 2019-11-08 DIAGNOSIS — I10 Essential (primary) hypertension: Secondary | ICD-10-CM

## 2019-11-08 DIAGNOSIS — I679 Cerebrovascular disease, unspecified: Secondary | ICD-10-CM

## 2019-11-08 DIAGNOSIS — I5032 Chronic diastolic (congestive) heart failure: Secondary | ICD-10-CM

## 2019-11-08 DIAGNOSIS — I2510 Atherosclerotic heart disease of native coronary artery without angina pectoris: Secondary | ICD-10-CM

## 2019-11-08 LAB — GLUCOSE, POCT (MANUAL RESULT ENTRY): POC Glucose: 95 mg/dl (ref 70–99)

## 2019-11-08 MED ORDER — CLONIDINE HCL 0.1 MG PO TABS: 0.1000 mg | ORAL_TABLET | Freq: Two times a day (BID) | ORAL | 2 refills | Status: DC

## 2019-11-08 MED ORDER — CARVEDILOL 12.5 MG PO TABS: 12.5000 mg | ORAL_TABLET | Freq: Two times a day (BID) | ORAL | 1 refills | Status: DC

## 2019-11-08 MED ORDER — LOSARTAN POTASSIUM 50 MG PO TABS: 50.0000 mg | ORAL_TABLET | Freq: Every day | ORAL | 1 refills | Status: DC

## 2019-11-08 MED ORDER — FUROSEMIDE 20 MG PO TABS: 20.0000 mg | ORAL_TABLET | Freq: Every day | ORAL | 1 refills | Status: DC

## 2019-11-08 MED ORDER — CLONIDINE HCL 0.1 MG PO TABS
0.2000 mg | ORAL_TABLET | Freq: Once | ORAL | Status: AC
  Administered 2019-11-08: 0.2 mg via ORAL

## 2019-11-08 MED ORDER — ATORVASTATIN CALCIUM 80 MG PO TABS: 80.0000 mg | ORAL_TABLET | Freq: Every day | ORAL | 1 refills | Status: DC

## 2019-11-08 MED ORDER — CLOPIDOGREL BISULFATE 75 MG PO TABS: 75.0000 mg | ORAL_TABLET | Freq: Every day | ORAL | 2 refills | Status: AC

## 2019-11-08 NOTE — Progress Notes (Signed)
Assessment & Plan:  [name] was seen today for blood pressure check.  Diagnoses and all orders for this visit:  Essential hypertension BRING HOME MONITOR FOR CORRELATION -     cloNIDine (CATAPRES) tablet 0.2 mg -     furosemide (LASIX) 20 MG tablet; Take 1 tablet (20 mg total) by mouth daily. -     losartan (COZAAR) 50 MG tablet; Take 1 tablet (50 mg total) by mouth daily. -     carvedilol (COREG) 12.5 MG tablet; Take 1 tablet (12.5 mg total) by mouth 2 (two) times daily with a meal. -     cloNIDine (CATAPRES) 0.1 MG tablet; Take 1 tablet (0.1 mg total) by mouth 2 (two) times daily. To lower blood pressure -     CMP14+EGFR; Future Continue all antihypertensives as prescribed.  Remember to bring in your blood pressure log with you for your follow up appointment.  DASH/Mediterranean Diets are healthier choices for HTN.    Prediabetes -     Glucose (CBG)  Chronic diastolic congestive heart failure (HCC) -     furosemide (LASIX) 20 MG tablet; Take 1 tablet (20 mg total) by mouth daily. -     carvedilol (COREG) 12.5 MG tablet; Take 1 tablet (12.5 mg total) by mouth 2 (two) times daily with a meal.  Coronary artery disease involving native coronary artery of native heart without angina pectoris -     atorvastatin (LIPITOR) 80 MG tablet; Take 1 tablet (80 mg total) by mouth daily. TAKE 1 TABLET BY MOUTH DAILY AT 6 PM -     clopidogrel (PLAVIX) 75 MG tablet; Take 1 tablet (75 mg total) by mouth daily.  Cerebrovascular disease -     clopidogrel (PLAVIX) 75 MG tablet; Take 1 tablet (75 mg total) by mouth daily.    Patient has been counseled on age-appropriate routine health concerns for screening and prevention. These are reviewed and up-to-date. Referrals have been placed accordingly. Immunizations are up-to-date or declined.    Subjective:   Chief Complaint  Patient presents with  . Blood Pressure Check    Pt. is here for blood pressure check.    HPI [name] 69 y.o. female  presents to office today for follow up. PMH: ASCVD, Cerebrovascular disease, Coronary artery disease, Diverticulitis, DJD of cervical spine, GERD, H. pylori infection, Hepatitis, HTN, Migraine headaches, PVD,  Shingles (11/21/2015), and Tobacco abuse.  Essential Hypertension Poorly controlled. Required clonidine 0.2 mg in office today. She is adamant that her home readings are normal with her monitor. Endorses compliance taking losartan 50 mg daily, carvedilol 12.5 mg BID and clonidine 0.56m BID.  She takes lasix as needed for BLE edema. Denies chest pain, shortness of breath, palpitations, lightheadedness, dizziness.  BP Readings from Last 3 Encounters:  11/08/19 (!) 187/91  07/06/19 (!) 211/102  09/20/18 (!) 211/109   Prediabetes Controlled with diet at this time.  Lab Results  Component Value Date   HGBA1C 5.4 07/06/2019   [facility] 07/06/2019 09/20/2018 01/28/2018 04/22/2017 07/18/2016  Decreased Interest 0 0 0 0 1  Down, Depressed, Hopeless 0 0 0 0 0  PHQ - 2 Score 0 0 0 0 1  Altered sleeping 0 0 0 2 -  Tired, decreased energy 0 0 0 1 -  Change in appetite 0 0 3 0 -  Feeling bad or failure about yourself  0 0 0 0 -  Trouble concentrating 0 0 0 0 -  Moving slowly or fidgety/restless 0  0 0 0 -  Suicidal thoughts 0 0 0 0 -  PHQ-9 Score 0 0 3 3 -   Review of Systems  Constitutional: Negative for fever, malaise/fatigue and weight loss.  HENT: Negative.  Negative for nosebleeds.   Eyes: Negative.  Negative for blurred vision, double vision and photophobia.  Respiratory: Negative.  Negative for cough and shortness of breath.   Cardiovascular: Negative.  Negative for chest pain, palpitations and leg swelling.  Gastrointestinal: Negative.  Negative for heartburn, nausea and vomiting.  Musculoskeletal: Negative.  Negative for myalgias.  Neurological: Negative.  Negative for dizziness, focal weakness, seizures and headaches.  Psychiatric/Behavioral: Negative.  Negative for  suicidal ideas.    Past Medical History:  Diagnosis Date  . ASCVD (arteriosclerotic cardiovascular disease)    CABG in 2006   . Cerebrovascular disease   . Coronary artery disease   . Diverticulitis   . DJD (degenerative joint disease) of cervical spine   . GERD (gastroesophageal reflux disease)   . H. pylori infection   . Hepatitis   . Hypertension   . Migraine headache   . PVD (peripheral vascular disease) ([place])   . Shingles 11/21/2015  . Tobacco abuse     Past Surgical History:  Procedure Laterality Date  . ABDOMINAL HYSTERECTOMY    . CAROTID ENDARTERECTOMY     Right  . CHOLECYSTECTOMY    . COLONOSCOPY    . CORONARY ARTERY BYPASS GRAFT  2006   RIMA to the RCA, LIMA to the LAD, SVG to the circumflex  . HEMORRHOID SURGERY    . LEFT HEART CATH Bilateral 04/17/2013   Procedure: LEFT HEART CATH;  Surgeon: Lorretta Harp, MD;  Location: [facility] CATH LAB;  Service: Cardiovascular;  Laterality: Bilateral;  . LEFT HEART CATHETERIZATION WITH CORONARY ANGIOGRAM N/A 09/27/2012   Procedure: LEFT HEART CATHETERIZATION WITH CORONARY ANGIOGRAM;  Surgeon: Lorretta Harp, MD;  Location: [facility] CATH LAB;  Service: Cardiovascular;  Laterality: N/A;    Family History  Problem Relation Age of Onset  . Pneumonia Mother   . Coronary artery disease Father   . Heart disease Sister   . Colon cancer Maternal Aunt        Great aunt, rectal  . Diabetes Maternal Uncle   . Heart disease Maternal Uncle   . Breast cancer Maternal Grandmother   . Heart disease Other        Father's side of family    Social History Reviewed with no changes to be made today.   Outpatient Medications Prior to Visit  Medication Sig Dispense Refill  . aspirin 81 MG tablet Take 1 tablet (81 mg total) by mouth daily. Reported on 06/07/2015 90 tablet 3  . nitroGLYCERIN (NITROSTAT) 0.4 MG SL tablet Place 1 tablet (0.4 mg total) under the tongue every 5 (five) minutes x 3 doses as needed for chest pain. 25 tablet 3  .  SYMBICORT 80-4.5 MCG/ACT inhaler INHALE 2 PUFFS INTO THE LUNGS TWICE DAILY 10.2 g 5  . clopidogrel (PLAVIX) 75 MG tablet Take 75 mg by mouth daily.    . furosemide (LASIX) 40 MG tablet Take 1 tablet (40 mg total) by mouth daily. 30 tablet 6  . pantoprazole (PROTONIX) 40 MG tablet Take 1 tablet (40 mg total) by mouth daily. 90 tablet 1  . atorvastatin (LIPITOR) 80 MG tablet Take 1 tablet (80 mg total) by mouth daily. TAKE 1 TABLET BY MOUTH DAILY AT 6 PM 90 tablet 1  . carvedilol (COREG) 12.5 MG tablet  Take 1 tablet (12.5 mg total) by mouth 2 (two) times daily with a meal. 180 tablet 1  . cloNIDine (CATAPRES) 0.1 MG tablet Take 1 tablet (0.1 mg total) by mouth 2 (two) times daily. To lower blood pressure 180 tablet 2  . losartan (COZAAR) 25 MG tablet Take 1 tablet (25 mg total) by mouth daily. 90 tablet 2   No facility-administered medications prior to visit.    Allergies  Allergen Reactions  . Ace Inhibitors Cough  . Codeine Itching    welts  . Morphine Itching    Welts        Objective:    BP (!) 187/91 (BP Location: Left Arm, Patient Position: Sitting, Cuff Size: Normal)   Pulse (!) 59   Temp 97.7 F (36.5 C) (Temporal)   Ht 5' 8"  (1.727 m)   Wt 197 lb (89.4 kg)   SpO2 97%   BMI 29.95 kg/m  Wt Readings from Last 3 Encounters:  11/08/19 197 lb (89.4 kg)  07/06/19 203 lb (92.1 kg)  09/20/18 215 lb (97.5 kg)    Physical Exam Vitals and nursing note reviewed.  Constitutional:      Appearance: She is well-developed.  HENT:     Head: Normocephalic and atraumatic.  Cardiovascular:     Rate and Rhythm: Regular rhythm. Bradycardia present.     Heart sounds: Normal heart sounds. No murmur heard.  No friction rub. No gallop.   Pulmonary:     Effort: Pulmonary effort is normal. No tachypnea or respiratory distress.     Breath sounds: Normal breath sounds. No decreased breath sounds, wheezing, rhonchi or rales.  Chest:     Chest wall: No tenderness.  Abdominal:      General: Bowel sounds are normal.     Palpations: Abdomen is soft.  Musculoskeletal:        General: Normal range of motion.     Cervical back: Normal range of motion.  Skin:    General: Skin is warm and dry.  Neurological:     Mental Status: She is alert and oriented to person, place, and time.     Coordination: Coordination normal.  Psychiatric:        Behavior: Behavior normal. Behavior is cooperative.        Thought Content: Thought content normal.        Judgment: Judgment normal.          Patient has been counseled extensively about nutrition and exercise as well as the importance of adherence with medications and regular follow-up. The patient was given clear instructions to go to ER or return to medical center if symptoms don't improve, worsen or new problems develop. The patient verbalized understanding.   Follow-up: Return in about 3 months (around 02/08/2020).   Gildardo Pounds, FNP-BC [facility] [place], [place]

## 2019-11-13 NOTE — Progress Notes (Deleted)
Cardiology Office Note:    Date:  11/13/2019   ID:  Shon, [place] [birth year], MRN [id number]  PCP:  Claiborne Rigg, NP  Cardiologist:  No primary care provider on file.   Referring MD: Claiborne Rigg, NP   No chief complaint on file.   History of Present Illness:    [name] is a 69 y.o. female with a hx of ASCVD, Cerebrovascular disease, Coronary artery disease, Diverticulitis, DJD of cervical spine, GERD, H. pylori infection, Hepatitis, HTN, Migraine headaches, PVD,  Shingles (11/21/2015), and Tobacco abuse. She is referred for cardiology consultation related to diastolic heart failure.  ***  Past Medical History:  Diagnosis Date  . ASCVD (arteriosclerotic cardiovascular disease)    CABG in 2006   . Cerebrovascular disease   . Coronary artery disease   . Diverticulitis   . DJD (degenerative joint disease) of cervical spine   . GERD (gastroesophageal reflux disease)   . H. pylori infection   . Hepatitis   . Hypertension   . Migraine headache   . PVD (peripheral vascular disease) (HCC)   . Shingles 11/21/2015  . Tobacco abuse     Past Surgical History:  Procedure Laterality Date  . ABDOMINAL HYSTERECTOMY    . CAROTID ENDARTERECTOMY     Right  . CHOLECYSTECTOMY    . COLONOSCOPY    . CORONARY ARTERY BYPASS GRAFT  2006   RIMA to the RCA, LIMA to the LAD, SVG to the circumflex  . HEMORRHOID SURGERY    . LEFT HEART CATH Bilateral 04/17/2013   Procedure: LEFT HEART CATH;  Surgeon: [name], MD;  Location[facility] CATH LAB;  Service: Cardiovascular;  Laterality: Bilateral;  . LEFT HEART CATHETERIZATION WITH CORONARY ANGIOGRAM N/A 09/27/2012   Procedure: LEFT HEART CATHETERIZATION WITH CORONARY ANGIOGRAM;  Surgeon: [name], MD;  Location: [facility] CATH LAB;  Service: Cardiovascular;  Laterality: N/A;    Current Medications: No outpatient medications have been marked as taking for the 11/18/19 encounter (Appointment) with Lyn Records, MD.      Allergies:   Ace inhibitors, Codeine, and Morphine   Social History   Socioeconomic History  . Marital status: Single    Spouse name: Not on file  . Number of children: Not on file  . Years of education: Not on file  . Highest education level: Not on file  Occupational History  . Occupation: Pensions consultant at [employer]    Employer: [employer]  Tobacco Use  . Smoking status: Former Smoker    Packs/day: 0.50  . Smokeless tobacco: Former Neurosurgeon    Quit date: 04/17/2013  . Tobacco comment: 1/2 pack per day since 2005, 40 pack years  Vaping Use  . Vaping Use: Never used  Substance and Sexual Activity  . Alcohol use: No  . Drug use: No  . Sexual activity: Yes  Other Topics Concern  . Not on file  Social History Narrative   Divorced, no children   Social Determinants of Health   Financial Resource Strain:   . Difficulty of Paying Living Expenses: Not on file  Food Insecurity:   . Worried About Programme researcher, broadcasting/film/video in the Last Year: Not on file  . Ran Out of Food in the Last Year: Not on file  Transportation Needs:   . Lack of Transportation (Medical): Not on file  . Lack of Transportation (Non-Medical): Not on file  Physical Activity:   . Days of Exercise per Week:  Not on file  . Minutes of Exercise per Session: Not on file  Stress:   . Feeling of Stress : Not on file  Social Connections:   . Frequency of Communication with Friends and Family: Not on file  . Frequency of Social Gatherings with Friends and Family: Not on file  . Attends Religious Services: Not on file  . Active Member of Clubs or Organizations: Not on file  . Attends Banker Meetings: Not on file  . Marital Status: Not on file     Family History: The patient's family history includes Breast cancer in her maternal grandmother; Colon cancer in her maternal aunt; Coronary artery disease in her father; Diabetes in her maternal uncle; Heart disease in her maternal uncle,  sister, and another family member; Pneumonia in her mother.  ROS:   Please see the history of present illness.    *** All other systems reviewed and are negative.  EKGs/Labs/Other Studies Reviewed:    The following studies were reviewed today:  2 D Doppler Echocardiogram 2015: Study Conclusions   - Left ventricle: The cavity size was normal. Wall thickness  was normal. Systolic function was normal. The estimated  ejection fraction was in the range of 55% to 60%. Inferior  hypokinesis was noted. Doppler parameters are consistent  with abnormal left ventricular relaxation (grade 1  diastolic dysfunction). The E/e' ratio is <10, suggesting  normal LV filling pressure.  - Left atrium: The atrium was normal in size.  - Inferior vena cava: The vessel was normal in size; the  respirophasic diameter changes were in the normal range (=  50%); findings are consistent with normal central venous  pressure.  - Pericardium, extracardiac: There was no pericardial  effusion.    EKG:  EKG ***  Recent Labs: 07/06/2019: ALT 16; Hemoglobin 14.8; Platelets 169 10/17/2019: BUN 17; Creatinine, Ser 1.28; Potassium 4.8; Sodium 144  Recent Lipid Panel    Component Value Date/Time   CHOL 168 07/06/2019 1528   TRIG 226 (H) 07/06/2019 1528   HDL 40 07/06/2019 1528   CHOLHDL 4.2 07/06/2019 1528   CHOLHDL 5.8 (H) 06/07/2015 1007   VLDL 66 (H) 06/07/2015 1007   LDLCALC 90 07/06/2019 1528    Physical Exam:    VS:  There were no vitals taken for this visit.    Wt Readings from Last 3 Encounters:  11/08/19 197 lb (89.4 kg)  07/06/19 203 lb (92.1 kg)  09/20/18 215 lb (97.5 kg)     GEN: ***. No acute distress HEENT: Normal NECK: No JVD. LYMPHATICS: No lymphadenopathy CARDIAC: *** RRR without murmur, gallop, or edema. VASCULAR: *** Normal Pulses. No bruits. RESPIRATORY:  Clear to auscultation without rales, wheezing or rhonchi  ABDOMEN: Soft, non-tender, non-distended, No  pulsatile mass, MUSCULOSKELETAL: No deformity  SKIN: Warm and dry NEUROLOGIC:  Alert and oriented x 3 PSYCHIATRIC:  Normal affect   ASSESSMENT:    1. Chronic diastolic congestive heart failure (HCC)   2. Coronary atherosclerosis of autologous vein bypass graft without angina   3. Essential hypertension   4. Peripheral vascular disease of lower extremity (HCC)   5. Cerebrovascular disease   6. Mixed hyperlipidemia   7. Chronic obstructive pulmonary disease, unspecified COPD type (HCC)   8. Obesity (BMI 30.0-34.9)   9. Educated about COVID-19 virus infection    PLAN:    In order of problems listed above:  1. ***   Medication Adjustments/Labs and Tests Ordered: Current medicines are reviewed at length with  the patient today.  Concerns regarding medicines are outlined above.  No orders of the defined types were placed in this encounter.  No orders of the defined types were placed in this encounter.   There are no Patient Instructions on file for this visit.   Signed, Lesleigh Noe, MD  11/13/2019 9:28 PM    [facility]

## 2019-11-18 ENCOUNTER — Ambulatory Visit: Payer: Medicaid Other | Admitting: Interventional Cardiology

## 2019-11-18 DIAGNOSIS — I25810 Atherosclerosis of coronary artery bypass graft(s) without angina pectoris: Secondary | ICD-10-CM

## 2019-11-18 DIAGNOSIS — Z7189 Other specified counseling: Secondary | ICD-10-CM

## 2019-11-18 DIAGNOSIS — I10 Essential (primary) hypertension: Secondary | ICD-10-CM

## 2019-11-18 DIAGNOSIS — E782 Mixed hyperlipidemia: Secondary | ICD-10-CM

## 2019-11-18 DIAGNOSIS — I5032 Chronic diastolic (congestive) heart failure: Secondary | ICD-10-CM

## 2019-11-18 DIAGNOSIS — E669 Obesity, unspecified: Secondary | ICD-10-CM

## 2019-11-18 DIAGNOSIS — I739 Peripheral vascular disease, unspecified: Secondary | ICD-10-CM

## 2019-11-18 DIAGNOSIS — J449 Chronic obstructive pulmonary disease, unspecified: Secondary | ICD-10-CM

## 2019-11-18 DIAGNOSIS — I679 Cerebrovascular disease, unspecified: Secondary | ICD-10-CM

## 2019-12-12 ENCOUNTER — Other Ambulatory Visit: Payer: Self-pay | Admitting: Nurse Practitioner

## 2019-12-12 DIAGNOSIS — F419 Anxiety disorder, unspecified: Secondary | ICD-10-CM

## 2019-12-12 NOTE — Telephone Encounter (Signed)
Requested medication (s) are due for refill today: Yes  Requested medication (s) are on the active medication list: No  Last refill:  07/06/19  Future visit scheduled: Yes  Notes to clinic:  Medication not on list.    Requested Prescriptions  Pending Prescriptions Disp Refills   busPIRone (BUSPAR) 10 MG tablet [Pharmacy Med Name: BUSPIRONE 10MG  TABLETS] 60 tablet 2    Sig: TAKE 1 TABLET(10 MG) BY MOUTH THREE TIMES DAILY      Psychiatry: Anxiolytics/Hypnotics - Non-controlled Passed - 12/12/2019  3:10 AM      Passed - Valid encounter within last 6 months    Recent Outpatient Visits           1 month ago Essential hypertension   [facility] [place], [place], NP   2 months ago Essential hypertension   [facility] [place], [place], NP   5 months ago Essential hypertension   [facility] [place], [place], NP   1 year ago Essential hypertension   [facility] [place], [place], NP   1 year ago Essential hypertension   [facility] Shea Stakes, MD       Future Appointments             In 3 weeks Cain Saupe, Katrinka Blazing, MD [facility] [address], [facility]   In 2 months [address], NP [facility]

## 2020-01-02 NOTE — Progress Notes (Deleted)
Cardiology Office Note:    Date:  01/02/2020   ID:  [name], DOB 26-Jan-1951, MRN [id number]  PCP:  Claiborne Rigg, NP  Cardiologist:  No primary care provider on file.   Referring MD: Claiborne Rigg, NP   No chief complaint on file.   History of Present Illness:    [name] is a 69 y.o. female with a hx of CAD with prior CABG 2006, repeated stenting to SVG OM 04/2013 DM II, primary hypertension, tobacco use, PVD, and cerebrovascular disease.  ***  Past Medical History:  Diagnosis Date  . ASCVD (arteriosclerotic cardiovascular disease)    CABG in 2006   . Cerebrovascular disease   . Coronary artery disease   . Diverticulitis   . DJD (degenerative joint disease) of cervical spine   . GERD (gastroesophageal reflux disease)   . H. pylori infection   . Hepatitis   . Hypertension   . Migraine headache   . PVD (peripheral vascular disease) (HCC)   . Shingles 11/21/2015  . Tobacco abuse     Past Surgical History:  Procedure Laterality Date  . ABDOMINAL HYSTERECTOMY    . CAROTID ENDARTERECTOMY     Right  . CHOLECYSTECTOMY    . COLONOSCOPY    . CORONARY ARTERY BYPASS GRAFT  2006   RIMA to the RCA, LIMA to the LAD, SVG to the circumflex  . HEMORRHOID SURGERY    . LEFT HEART CATH Bilateral 04/17/2013   Procedure: LEFT HEART CATH;  Surgeon: [name], MD;  Location: [facility] C[facility] LAB;  Service: Cardiovascular;  Laterality: Bilateral;  . LEFT HEART CATHETERIZATION WITH CORONARY ANGIOGRAM N/A 09/27/2012   Procedure: LEFT HEART CATHETERIZATION WITH CORONARY ANGIOGRAM;  Surgeon: [name], MD;  Location: [facility] CATH LAB;  Service: Cardiovascular;  Laterality: N/A;    Current Medications: No outpatient medications have been marked as taking for the 01/05/20 encounter (Appointment) with Lyn Records, MD.     Allergies:   Ace inhibitors, Codeine, and Morphine   Social History   Socioeconomic History  . Marital status: Single    Spouse name: Not on file  .  Number of children: Not on file  . Years of education: Not on file  . Highest education level: Not on file  Occupational History  . Occupation: Pensions consultant at [employer]    Employer: [employer]  Tobacco Use  . Smoking status: Former Smoker    Packs/day: 0.50  . Smokeless tobacco: Former Neurosurgeon    Quit date: 04/17/2013  . Tobacco comment: 1/2 pack per day since 2005, 40 pack years  Vaping Use  . Vaping Use: Never used  Substance and Sexual Activity  . Alcohol use: No  . Drug use: No  . Sexual activity: Yes  Other Topics Concern  . Not on file  Social History Narrative   Divorced, no children   Social Determinants of Health   Financial Resource Strain:   . Difficulty of Paying Living Expenses: Not on file  Food Insecurity:   . Worried About Programme researcher, broadcasting/film/video in the Last Year: Not on file  . Ran Out of Food in the Last Year: Not on file  Transportation Needs:   . Lack of Transportation (Medical): Not on file  . Lack of Transportation (Non-Medical): Not on file  Physical Activity:   . Days of Exercise per Week: Not on file  . Minutes of Exercise per Session: Not on file  Stress:   .  Feeling of Stress : Not on file  Social Connections:   . Frequency of Communication with Friends and Family: Not on file  . Frequency of Social Gatherings with Friends and Family: Not on file  . Attends Religious Services: Not on file  . Active Member of Clubs or Organizations: Not on file  . Attends Banker Meetings: Not on file  . Marital Status: Not on file     Family History: The patient's family history includes Breast cancer in her maternal grandmother; Colon cancer in her maternal aunt; Coronary artery disease in her father; Diabetes in her maternal uncle; Heart disease in her maternal uncle, sister, and another family member; Pneumonia in her mother.  ROS:   Please see the history of present illness.    *** All other systems reviewed and are  negative.  EKGs/Labs/Other Studies Reviewed:    The following studies were reviewed today: ***  EKG:  EKG ***  Recent Labs: 07/06/2019: ALT 16; Hemoglobin 14.8; Platelets 169 10/17/2019: BUN 17; Creatinine, Ser 1.28; Potassium 4.8; Sodium 144  Recent Lipid Panel    Component Value Date/Time   CHOL 168 07/06/2019 1528   TRIG 226 (H) 07/06/2019 1528   HDL 40 07/06/2019 1528   CHOLHDL 4.2 07/06/2019 1528   CHOLHDL 5.8 (H) 06/07/2015 1007   VLDL 66 (H) 06/07/2015 1007   LDLCALC 90 07/06/2019 1528    Physical Exam:    VS:  There were no vitals taken for this visit.    Wt Readings from Last 3 Encounters:  11/08/19 197 lb (89.4 kg)  07/06/19 203 lb (92.1 kg)  09/20/18 215 lb (97.5 kg)     GEN: ***. No acute distress HEENT: Normal NECK: No JVD. LYMPHATICS: No lymphadenopathy CARDIAC: *** RRR without murmur, gallop, or edema. VASCULAR: *** Normal Pulses. No bruits. RESPIRATORY:  Clear to auscultation without rales, wheezing or rhonchi  ABDOMEN: Soft, non-tender, non-distended, No pulsatile mass, MUSCULOSKELETAL: No deformity  SKIN: Warm and dry NEUROLOGIC:  Alert and oriented x 3 PSYCHIATRIC:  Normal affect   ASSESSMENT:    1. Chronic diastolic congestive heart failure (HCC)   2. Coronary atherosclerosis of autologous vein bypass graft without angina   3. Essential hypertension   4. Peripheral vascular disease of lower extremity (HCC)   5. Mixed hyperlipidemia   6. Cerebrovascular disease   7. Educated about COVID-19 virus infection    PLAN:    In order of problems listed above:  1. ***   Medication Adjustments/Labs and Tests Ordered: Current medicines are reviewed at length with the patient today.  Concerns regarding medicines are outlined above.  No orders of the defined types were placed in this encounter.  No orders of the defined types were placed in this encounter.   There are no Patient Instructions on file for this visit.   Signed, Lesleigh Noe, MD  01/02/2020 9:26 PM    [facility]

## 2020-01-05 ENCOUNTER — Ambulatory Visit: Payer: Medicaid Other | Admitting: Interventional Cardiology

## 2020-01-20 ENCOUNTER — Other Ambulatory Visit: Payer: Self-pay | Admitting: Nurse Practitioner

## 2020-01-20 DIAGNOSIS — I2510 Atherosclerotic heart disease of native coronary artery without angina pectoris: Secondary | ICD-10-CM

## 2020-01-31 ENCOUNTER — Other Ambulatory Visit: Payer: Self-pay | Admitting: Nurse Practitioner

## 2020-01-31 DIAGNOSIS — I10 Essential (primary) hypertension: Secondary | ICD-10-CM

## 2020-01-31 DIAGNOSIS — I5032 Chronic diastolic (congestive) heart failure: Secondary | ICD-10-CM

## 2020-02-10 ENCOUNTER — Ambulatory Visit: Payer: Medicaid Other | Admitting: Nurse Practitioner

## 2020-02-12 ENCOUNTER — Other Ambulatory Visit: Payer: Self-pay | Admitting: Nurse Practitioner

## 2020-02-12 DIAGNOSIS — I10 Essential (primary) hypertension: Secondary | ICD-10-CM

## 2020-02-12 DIAGNOSIS — I5032 Chronic diastolic (congestive) heart failure: Secondary | ICD-10-CM

## 2020-03-23 ENCOUNTER — Ambulatory Visit: Payer: Medicaid Other | Attending: Nurse Practitioner | Admitting: Nurse Practitioner

## 2020-03-23 ENCOUNTER — Encounter: Payer: Self-pay | Admitting: Nurse Practitioner

## 2020-03-23 ENCOUNTER — Other Ambulatory Visit: Payer: Self-pay

## 2020-03-23 DIAGNOSIS — I10 Essential (primary) hypertension: Secondary | ICD-10-CM

## 2020-03-23 DIAGNOSIS — I5032 Chronic diastolic (congestive) heart failure: Secondary | ICD-10-CM | POA: Diagnosis not present

## 2020-03-23 NOTE — Progress Notes (Signed)
Virtual Visit via Telephone Note Due to national recommendations of social distancing due to COVID 19, telehealth visit is felt to be most appropriate for this patient at this time.  I discussed the limitations, risks, security and privacy concerns of performing an evaluation and management service by telephone and the availability of in person appointments. I also discussed with the patient that there may be a patient responsible charge related to this service. The patient expressed understanding and agreed to proceed.    I connected with [name] on 03/23/20  at   3:10 PM EST  EDT by telephone and verified that I am speaking with the correct person using two identifiers.   Consent I discussed the limitations, risks, security and privacy concerns of performing an evaluation and management service by telephone and the availability of in person appointments. I also discussed with the patient that there may be a patient responsible charge related to this service. The patient expressed understanding and agreed to proceed.   Location of Patient: Private Residence   [facility] of Provider: [facility]    Persons participating in Telemedicine visit: [name] [name] [name]    History of Present Illness: Telemedicine visit for: Follow Up She has a past medical history of ASCVD, Cerebrovascular disease, Coronary artery disease, Diverticulitis, DJD of cervical spine, GERD, H. pylori infection, Hepatitis, Hypertension, Migraine headache, PVD, Shingles (11/21/2015), and Tobacco abuse.  Endorsing several nose bleeds over the past few months. It always occurs at night. Bleeding is not profuse but has been occurring more frequently. She denies headache, chest pain or sinus pain. She is taking plavix which may explain the nose bleeds.   Essential Hypertension She has a monitor to check her blood pressure but has not been monitoring at home. Will  need to bring her home monitor in for correlation as she reports her home readings are much less than her office readings. Denies chest pain, shortness of breath, palpitations, lightheadedness, dizziness, headaches or BLE edema. She is currently taking carvedilol 12.5 mg BID, clonidine 0. Mg BID, losartan 50 mg daily and furosemide 20 mg daily. Leg edema has significantly improved.  BP Readings from Last 3 Encounters:  11/08/19 (!) 187/91  07/06/19 (!) 211/102  09/20/18 (!) 211/109    Past Medical History:  Diagnosis Date  . ASCVD (arteriosclerotic cardiovascular disease)    CABG in 2006   . Cerebrovascular disease   . Coronary artery disease   . Diverticulitis   . DJD (degenerative joint disease) of cervical spine   . GERD (gastroesophageal reflux disease)   . H. pylori infection   . Hepatitis   . Hypertension   . Migraine headache   . PVD (peripheral vascular disease) (HCC)   . Shingles 11/21/2015  . Tobacco abuse     Past Surgical History:  Procedure Laterality Date  . ABDOMINAL HYSTERECTOMY    . CAROTID ENDARTERECTOMY     Right  . CHOLECYSTECTOMY    . COLONOSCOPY    . CORONARY ARTERY BYPASS GRAFT  2006   RIMA to the RCA, LIMA to the LAD, SVG to the circumflex  . HEMORRHOID SURGERY    . LEFT HEART CATH Bilateral 04/17/2013   Procedure: LEFT HEART CATH;  Surgeon: [name], MD;  Location: [facility] CATH LAB;  Service: Cardiovascular;  Laterality: Bilateral;  . LEFT HEART CATHETERIZATION WITH CORONARY ANGIOGRAM N/A 09/27/2012   Procedure: LEFT HEART CATHETERIZATION WITH CORONARY ANGIOGRAM;  Surgeon: Delton See  Allyson Sabal, MD;  Location: [facility] CATH LAB;  Service: Cardiovascular;  Laterality: N/A;    Family History  Problem Relation Age of Onset  . Pneumonia Mother   . Coronary artery disease Father   . Heart disease Sister   . Colon cancer Maternal Aunt        Great aunt, rectal  . Diabetes Maternal Uncle   . Heart disease Maternal Uncle   . Breast cancer Maternal Grandmother    . Heart disease Other        Father's side of family    Social History   Socioeconomic History  . Marital status: Single    Spouse name: Not on file  . Number of children: Not on file  . Years of education: Not on file  . Highest education level: Not on file  Occupational History  . Occupation: Pensions consultant at [employer]    Employer: [employer]  Tobacco Use  . Smoking status: Former Smoker    Packs/day: 0.50  . Smokeless tobacco: Former Neurosurgeon    Quit date: 04/17/2013  . Tobacco comment: 1/2 pack per day since 2005, 40 pack years  Vaping Use  . Vaping Use: Never used  Substance and Sexual Activity  . Alcohol use: No  . Drug use: No  . Sexual activity: Yes  Other Topics Concern  . Not on file  Social History Narrative   Divorced, no children   Social Determinants of Health   Financial Resource Strain: Not on file  Food Insecurity: Not on file  Transportation Needs: Not on file  Physical Activity: Not on file  Stress: Not on file  Social Connections: Not on file     Observations/Objective: Awake, alert and oriented x 3   Review of Systems  Constitutional: Negative for fever, malaise/fatigue and weight loss.  HENT: Negative.  Negative for nosebleeds.   Eyes: Negative.  Negative for blurred vision, double vision and photophobia.  Respiratory: Negative.  Negative for cough and shortness of breath.   Cardiovascular: Negative.  Negative for chest pain, palpitations and leg swelling.  Gastrointestinal: Negative.  Negative for heartburn, nausea and vomiting.  Musculoskeletal: Negative.  Negative for myalgias.  Neurological: Negative.  Negative for dizziness, focal weakness, seizures and headaches.  Psychiatric/Behavioral: Negative.  Negative for suicidal ideas.    Assessment and Plan: [name] was seen today for follow-up.  Diagnoses and all orders for this visit:  Essential hypertension Continue all antihypertensives as prescribed.  Remember  to bring in your blood pressure log with you for your follow up appointment.  DASH/Mediterranean Diets are healthier choices for HTN.    Chronic diastolic congestive heart failure (HCC) Continue furosemide as prescribed     Follow Up Instructions Return in about 3 months (around 06/20/2020).     I discussed the assessment and treatment plan with the patient. The patient was provided an opportunity to ask questions and all were answered. The patient agreed with the plan and demonstrated an understanding of the instructions.   The patient was advised to call back or seek an in-person evaluation if the symptoms worsen or if the condition fails to improve as anticipated.  I provided 12 minutes of non-face-to-face time during this encounter including median intraservice time, reviewing previous notes, labs, imaging, medications and explaining diagnosis and management.  Claiborne Rigg, Yu

## 2020-03-30 ENCOUNTER — Ambulatory Visit: Payer: Medicaid Other | Attending: Nurse Practitioner

## 2020-03-30 ENCOUNTER — Other Ambulatory Visit: Payer: Self-pay | Admitting: Nurse Practitioner

## 2020-03-30 ENCOUNTER — Other Ambulatory Visit: Payer: Self-pay

## 2020-03-30 DIAGNOSIS — E785 Hyperlipidemia, unspecified: Secondary | ICD-10-CM

## 2020-03-30 DIAGNOSIS — I10 Essential (primary) hypertension: Secondary | ICD-10-CM

## 2020-03-30 DIAGNOSIS — R7303 Prediabetes: Secondary | ICD-10-CM

## 2020-03-30 DIAGNOSIS — N1831 Chronic kidney disease, stage 3a: Secondary | ICD-10-CM

## 2020-03-31 ENCOUNTER — Encounter: Payer: Self-pay | Admitting: Nurse Practitioner

## 2020-03-31 LAB — CBC
Hematocrit: 37.3 % (ref 34.0–46.6)
Hemoglobin: 12.8 g/dL (ref 11.1–15.9)
MCH: 30.2 pg (ref 26.6–33.0)
MCHC: 34.3 g/dL (ref 31.5–35.7)
MCV: 88 fL (ref 79–97)
Platelets: 191 10*3/uL (ref 150–450)
RBC: 4.24 x10E6/uL (ref 3.77–5.28)
RDW: 13.1 % (ref 11.7–15.4)
WBC: 4.7 10*3/uL (ref 3.4–10.8)

## 2020-03-31 LAB — LIPID PANEL
Chol/HDL Ratio: 3.1 ratio (ref 0.0–4.4)
Cholesterol, Total: 166 mg/dL (ref 100–199)
HDL: 54 mg/dL (ref >39)
LDL Chol Calc (NIH): 95 mg/dL (ref 0–99)
Triglycerides: 91 mg/dL (ref 0–149)
VLDL Cholesterol Cal: 17 mg/dL (ref 5–40)

## 2020-03-31 LAB — CMP14+EGFR
ALT: 12 IU/L (ref 0–32)
AST: 12 IU/L (ref 0–40)
Albumin/Globulin Ratio: 1.9 (ref 1.2–2.2)
Albumin: 4.6 g/dL (ref 3.8–4.8)
Alkaline Phosphatase: 101 IU/L (ref 44–121)
BUN/Creatinine Ratio: 17 (ref 12–28)
BUN: 22 mg/dL (ref 8–27)
Bilirubin Total: 0.5 mg/dL (ref 0.0–1.2)
CO2: 24 mmol/L (ref 20–29)
Calcium: 9.7 mg/dL (ref 8.7–10.3)
Chloride: 105 mmol/L (ref 96–106)
Creatinine, Ser: 1.29 mg/dL — ABNORMAL HIGH (ref 0.57–1.00)
GFR calc Af Amer: 49 mL/min/{1.73_m2} — ABNORMAL LOW (ref >59)
GFR calc non Af Amer: 42 mL/min/{1.73_m2} — ABNORMAL LOW (ref >59)
Globulin, Total: 2.4 g/dL (ref 1.5–4.5)
Glucose: 88 mg/dL (ref 65–99)
Potassium: 5.1 mmol/L (ref 3.5–5.2)
Sodium: 142 mmol/L (ref 134–144)
Total Protein: 7 g/dL (ref 6.0–8.5)

## 2020-03-31 LAB — HEMOGLOBIN A1C
Est. average glucose Bld gHb Est-mCnc: 111 mg/dL
Hgb A1c MFr Bld: 5.5 % (ref 4.8–5.6)

## 2020-04-01 ENCOUNTER — Other Ambulatory Visit: Payer: Self-pay | Admitting: Nurse Practitioner

## 2020-04-01 MED ORDER — LOSARTAN POTASSIUM 25 MG PO TABS: 25.0000 mg | ORAL_TABLET | Freq: Two times a day (BID) | ORAL | 3 refills | Status: DC

## 2020-05-04 ENCOUNTER — Ambulatory Visit: Payer: Medicaid Other | Attending: Nurse Practitioner | Admitting: Nurse Practitioner

## 2020-05-04 ENCOUNTER — Other Ambulatory Visit: Payer: Self-pay

## 2020-05-04 ENCOUNTER — Encounter: Payer: Self-pay | Admitting: Nurse Practitioner

## 2020-05-04 VITALS — BP 226/85 | HR 53 | Resp 16 | Ht 68.0 in | Wt 196.6 lb

## 2020-05-04 DIAGNOSIS — Z8249 Family history of ischemic heart disease and other diseases of the circulatory system: Secondary | ICD-10-CM | POA: Diagnosis not present

## 2020-05-04 DIAGNOSIS — Z7982 Long term (current) use of aspirin: Secondary | ICD-10-CM | POA: Insufficient documentation

## 2020-05-04 DIAGNOSIS — Z7902 Long term (current) use of antithrombotics/antiplatelets: Secondary | ICD-10-CM | POA: Insufficient documentation

## 2020-05-04 DIAGNOSIS — I2510 Atherosclerotic heart disease of native coronary artery without angina pectoris: Secondary | ICD-10-CM | POA: Diagnosis not present

## 2020-05-04 DIAGNOSIS — Z1211 Encounter for screening for malignant neoplasm of colon: Secondary | ICD-10-CM

## 2020-05-04 DIAGNOSIS — I10 Essential (primary) hypertension: Secondary | ICD-10-CM | POA: Diagnosis present

## 2020-05-04 DIAGNOSIS — Z1231 Encounter for screening mammogram for malignant neoplasm of breast: Secondary | ICD-10-CM | POA: Diagnosis not present

## 2020-05-04 DIAGNOSIS — Z79899 Other long term (current) drug therapy: Secondary | ICD-10-CM | POA: Diagnosis not present

## 2020-05-04 DIAGNOSIS — I160 Hypertensive urgency: Secondary | ICD-10-CM

## 2020-05-04 DIAGNOSIS — Z13820 Encounter for screening for osteoporosis: Secondary | ICD-10-CM

## 2020-05-04 DIAGNOSIS — Z951 Presence of aortocoronary bypass graft: Secondary | ICD-10-CM | POA: Diagnosis not present

## 2020-05-04 NOTE — Progress Notes (Signed)
Assessment & Plan:  [name] was seen today for hypertension.  Diagnoses and all orders for this visit:  Essential hypertension -     Basic metabolic panel Continue all antihypertensives as prescribed.  Remember to bring in your blood pressure log with you for your follow up appointment.  DASH/Mediterranean Diets are healthier choices for HTN.    Hypertensive urgency Unable to give clonidine in the office today due to bradycardia EKG performed  Breast cancer screening by mammogram -     MM DIGITAL SCREENING BILATERAL; Future  Osteoporosis screening -     DG Bone Density; Future    Patient has been counseled on age-appropriate routine health concerns for screening and prevention. These are reviewed and up-to-date. Referrals have been placed accordingly. Immunizations are up-to-date or declined.    Subjective:   Chief Complaint  Patient presents with  . Hypertension   HPI [name] 70 y.o. female presents to office today for HTN She has a past medical history of ASCVD, Cerebrovascular disease, Coronary artery disease, Diverticulitis, DJD of cervical spine, GERD, H. pylori infection, Hepatitis, Hypertension, Migraine headache, PVD, Shingles (11/21/2015), and Tobacco abuse.  HTN Poorly controlled. She has not followed up with Cardiology. Most of her antihypertensives have not been picked up for refills per the pharmacist. She has a history of non adherence. She is currently prescribed clonidine 0.1 mg BID, carvedilol 12.5 mg BID, losartan 25 mg BID and furosemide 20 mg daily. Continues to smoke daily. Denies chest pain, shortness of breath, palpitations, lightheadedness, dizziness, headaches or BLE edema.  BP Readings from Last 3 Encounters:  05/04/20 (!) 226/85  11/08/19 (!) 187/91  07/06/19 (!) 211/102    Review of Systems  Constitutional: Negative for fever, malaise/fatigue and weight loss.  HENT: Negative.  Negative for nosebleeds.   Eyes: Negative.  Negative for  blurred vision, double vision and photophobia.  Respiratory: Negative.  Negative for cough and shortness of breath.   Cardiovascular: Negative.  Negative for chest pain, palpitations and leg swelling.  Gastrointestinal: Negative.  Negative for heartburn, nausea and vomiting.  Musculoskeletal: Negative.  Negative for myalgias.  Neurological: Negative.  Negative for dizziness, focal weakness, seizures and headaches.  Psychiatric/Behavioral: Negative.  Negative for suicidal ideas.    Past Medical History:  Diagnosis Date  . ASCVD (arteriosclerotic cardiovascular disease)    CABG in 2006   . Cerebrovascular disease   . Coronary artery disease   . Diverticulitis   . DJD (degenerative joint disease) of cervical spine   . GERD (gastroesophageal reflux disease)   . H. pylori infection   . Hepatitis   . Hypertension   . Migraine headache   . PVD (peripheral vascular disease) (HCC)   . Shingles 11/21/2015  . Tobacco abuse     Past Surgical History:  Procedure Laterality Date  . ABDOMINAL HYSTERECTOMY    . CAROTID ENDARTERECTOMY     Right  . CHOLECYSTECTOMY    . COLONOSCOPY    . CORONARY ARTERY BYPASS GRAFT  2006   RIMA to the RCA, LIMA to the LAD, SVG to the circumflex  . HEMORRHOID SURGERY    . LEFT HEART CATH Bilateral 04/17/2013   Procedure: LEFT HEART CATH;  Surgeon: [name], MD;  Location: [facility] CATH LAB;  Service: Cardiovascular;  Laterality: Bilateral;  . LEFT HEART CATHETERIZATION WITH CORONARY ANGIOGRAM N/A 09/27/2012   Procedure: LEFT HEART CATHETERIZATION WITH CORONARY ANGIOGRAM;  Surgeon: [name], MD;  Location: [facility] CATH LAB;  Service: Cardiovascular;  Laterality: N/A;    Family History  Problem Relation Age of Onset  . Pneumonia Mother   . Coronary artery disease Father   . Heart disease Sister   . Colon cancer Maternal Aunt        Great aunt, rectal  . Diabetes Maternal Uncle   . Heart disease Maternal Uncle   . Breast cancer Maternal Grandmother    . Heart disease Other        Father's side of family    Social History Reviewed with no changes to be made today.   Outpatient Medications Prior to Visit  Medication Sig Dispense Refill  . aspirin 81 MG tablet Take 1 tablet (81 mg total) by mouth daily. Reported on 06/07/2015 90 tablet 3  . atorvastatin (LIPITOR) 80 MG tablet TAKE 1 TABLET(80 MG) BY MOUTH DAILY AT 6 PM 90 tablet 1  . busPIRone (BUSPAR) 10 MG tablet TAKE 1 TABLET(10 MG) BY MOUTH THREE TIMES DAILY 60 tablet 2  . clopidogrel (PLAVIX) 75 MG tablet clopidogrel 75 mg tablet    . nitroGLYCERIN (NITROSTAT) 0.4 MG SL tablet Place 1 tablet (0.4 mg total) under the tongue every 5 (five) minutes x 3 doses as needed for chest pain. 25 tablet 3  . SYMBICORT 80-4.5 MCG/ACT inhaler INHALE 2 PUFFS INTO THE LUNGS TWICE DAILY 10.2 g 5  . carvedilol (COREG) 12.5 MG tablet Take 1 tablet (12.5 mg total) by mouth 2 (two) times daily with a meal. 180 tablet 1  . cloNIDine (CATAPRES) 0.1 MG tablet Take 1 tablet (0.1 mg total) by mouth 2 (two) times daily. To lower blood pressure 180 tablet 2  . furosemide (LASIX) 20 MG tablet Take 1 tablet (20 mg total) by mouth daily. 90 tablet 1  . losartan (COZAAR) 25 MG tablet Take 1 tablet (25 mg total) by mouth in the morning and at bedtime. 60 tablet 3  . pantoprazole (PROTONIX) 40 MG tablet Take 1 tablet (40 mg total) by mouth daily. 90 tablet 1   No facility-administered medications prior to visit.    Allergies  Allergen Reactions  . Ace Inhibitors Cough  . Codeine Itching    welts  . Morphine Itching    Welts        Objective:    BP (!) 226/85   Pulse (!) 53   Resp 16   Ht 5\' 8"  (1.727 m)   Wt 196 lb 9.6 oz (89.2 kg)   SpO2 96%   BMI 29.89 kg/m  Wt Readings from Last 3 Encounters:  05/04/20 196 lb 9.6 oz (89.2 kg)  11/08/19 197 lb (89.4 kg)  07/06/19 203 lb (92.1 kg)    Physical Exam Vitals and nursing note reviewed.  Constitutional:      Appearance: She is well-developed.   HENT:     Head: Normocephalic and atraumatic.  Cardiovascular:     Rate and Rhythm: Regular rhythm. Bradycardia present.     Heart sounds: Normal heart sounds. No murmur heard. No friction rub. No gallop.   Pulmonary:     Effort: Pulmonary effort is normal. No tachypnea or respiratory distress.     Breath sounds: Normal breath sounds. No decreased breath sounds, wheezing, rhonchi or rales.  Chest:     Chest wall: No tenderness.  Abdominal:     General: Bowel sounds are normal.     Palpations: Abdomen is soft.  Musculoskeletal:        General: Normal range of motion.     Cervical back:  Normal range of motion.  Skin:    General: Skin is warm and dry.  Neurological:     Mental Status: She is alert and oriented to person, place, and time.     Coordination: Coordination normal.  Psychiatric:        Behavior: Behavior normal. Behavior is cooperative.        Thought Content: Thought content normal.        Judgment: Judgment normal.          Patient has been counseled extensively about nutrition and exercise as well as the importance of adherence with medications and regular follow-up. The patient was given clear instructions to go to ER or return to medical center if symptoms don't improve, worsen or new problems develop. The patient verbalized understanding.   Follow-up: Return in about 3 months (around 08/03/2020).   Claiborne Rigg, FNP-[facility] [place], [place] [phone]   05/04/2020, 11:26 AM

## 2020-05-05 LAB — BASIC METABOLIC PANEL
BUN/Creatinine Ratio: 18 (ref 12–28)
BUN: 22 mg/dL (ref 8–27)
CO2: 20 mmol/L (ref 20–29)
Calcium: 9.1 mg/dL (ref 8.7–10.3)
Chloride: 104 mmol/L (ref 96–106)
Creatinine, Ser: 1.25 mg/dL — ABNORMAL HIGH (ref 0.57–1.00)
Glucose: 78 mg/dL (ref 65–99)
Potassium: 4.9 mmol/L (ref 3.5–5.2)
Sodium: 141 mmol/L (ref 134–144)
eGFR: 47 mL/min/{1.73_m2} — ABNORMAL LOW (ref >59)

## 2020-07-14 ENCOUNTER — Other Ambulatory Visit: Payer: Self-pay | Admitting: Nurse Practitioner

## 2020-07-14 DIAGNOSIS — I2510 Atherosclerotic heart disease of native coronary artery without angina pectoris: Secondary | ICD-10-CM

## 2020-07-14 NOTE — Telephone Encounter (Signed)
Filled for 6 months.

## 2020-07-27 ENCOUNTER — Other Ambulatory Visit: Payer: Self-pay | Admitting: Nurse Practitioner

## 2020-07-27 DIAGNOSIS — I5032 Chronic diastolic (congestive) heart failure: Secondary | ICD-10-CM

## 2020-07-27 DIAGNOSIS — I10 Essential (primary) hypertension: Secondary | ICD-10-CM

## 2020-07-30 ENCOUNTER — Other Ambulatory Visit: Payer: Self-pay | Admitting: Nurse Practitioner

## 2020-08-10 ENCOUNTER — Ambulatory Visit: Payer: Medicaid Other | Admitting: Nurse Practitioner

## 2020-10-21 ENCOUNTER — Other Ambulatory Visit: Payer: Self-pay | Admitting: Nurse Practitioner

## 2020-10-21 DIAGNOSIS — I10 Essential (primary) hypertension: Secondary | ICD-10-CM

## 2020-10-21 DIAGNOSIS — I5032 Chronic diastolic (congestive) heart failure: Secondary | ICD-10-CM

## 2020-10-22 NOTE — Telephone Encounter (Signed)
Requested medications are due for refill today.  yes  Requested medications are on the active medications list.  yes  Last refill. Plavix - 03/23/2020, Coreg - 07/27/2020  Future visit scheduled.   no  Notes to clinic.  Plavix is under "historical'. Coreg has expired labs.

## 2020-10-23 ENCOUNTER — Other Ambulatory Visit: Payer: Self-pay | Admitting: Family Medicine

## 2020-10-23 DIAGNOSIS — I5032 Chronic diastolic (congestive) heart failure: Secondary | ICD-10-CM

## 2020-10-23 DIAGNOSIS — I10 Essential (primary) hypertension: Secondary | ICD-10-CM

## 2020-10-23 NOTE — Telephone Encounter (Signed)
Requested medication (s) are due for refill today: signed today for #60  Requested medication (s) are on the active medication list: yes  Last refill:  10/23/20 #60 1 refill  Future visit scheduled: no  Notes to clinic:  patient requesting 90 day supply. Called patient to schedule appt no answer, LVMTCB. Do you want to give 90 day supply?     Requested Prescriptions  Pending Prescriptions Disp Refills   carvedilol (COREG) 12.5 MG tablet [Pharmacy Med Name: CARVEDILOL 12.5MG  TABLETS] 180 tablet     Sig: TAKE 1 TABLET(12.5 MG) BY MOUTH TWICE DAILY WITH A MEAL     Cardiovascular:  Beta Blockers Failed - 10/23/2020  9:27 AM      Failed - Last BP in normal range    BP Readings from Last 1 Encounters:  05/04/20 (!) 226/85          Passed - Last Heart Rate in normal range    Pulse Readings from Last 1 Encounters:  05/04/20 (!) 53          Passed - Valid encounter within last 6 months    Recent Outpatient Visits           5 months ago Essential hypertension   [facility] [place], Shea Stakes, NP   7 months ago Essential hypertension   [facility] [place], Shea Stakes, NP   11 months ago Essential hypertension   [facility] [place], Shea Stakes, NP   1 year ago Essential hypertension    [facility] [place], Shea Stakes, NP   1 year ago Essential hypertension   [facility] [place], Shea Stakes, NP

## 2020-12-12 ENCOUNTER — Other Ambulatory Visit: Payer: Self-pay | Admitting: Nurse Practitioner

## 2020-12-12 NOTE — Telephone Encounter (Signed)
Requested medication (s) are due for refill today: {Yes  Requested medication (s) are on the active medication list: yes  Last refill:  07/30/20 #60/2RF  Future visit scheduled: NO  Notes to clinic:  Unable to refill per protocol, appointment needed.      Requested Prescriptions  Pending Prescriptions Disp Refills   losartan (COZAAR) 25 MG tablet [Pharmacy Med Name: LOSARTAN 25MG  TABLETS] 60 tablet 3    Sig: TAKE 1 TABLET(25 MG) BY MOUTH IN THE MORNING AND AT BEDTIME     Cardiovascular:  Angiotensin Receptor Blockers Failed - 12/12/2020 12:29 PM      Failed - Cr in normal range and within 180 days    Creat  Date Value Ref Range Status  06/07/2015 1.22 (H) 0.50 - 0.99 mg/dL Final   Creatinine, Ser  Date Value Ref Range Status  05/04/2020 1.25 (H) 0.57 - 1.00 mg/dL Final          Failed - K in normal range and within 180 days    Potassium  Date Value Ref Range Status  05/04/2020 4.9 3.5 - 5.2 mmol/L Final          Failed - Last BP in normal range    BP Readings from Last 1 Encounters:  05/04/20 (!) 226/85          Failed - Valid encounter within last 6 months    Recent Outpatient Visits           7 months ago Essential hypertension   [facility] [place], [place], NP   8 months ago Essential hypertension   [facility] [place], [place], NP   1 year ago Essential hypertension   [facility] [place], [place], NP   1 year ago Essential hypertension   [facility] [place], [place], NP   1 year ago Essential hypertension   [place] [facility] [place], [place], NP              Passed - Patient is not pregnant

## 2021-01-30 ENCOUNTER — Other Ambulatory Visit: Payer: Self-pay | Admitting: Nurse Practitioner

## 2021-01-30 NOTE — Telephone Encounter (Signed)
Will need an OV for additional refills.  LMOV requesting OV.   Availability for tomorrow. 01/31/2021, with Provider McClung,PA-C

## 2021-02-12 ENCOUNTER — Encounter: Payer: Self-pay | Admitting: Nurse Practitioner

## 2021-02-12 ENCOUNTER — Telehealth (HOSPITAL_BASED_OUTPATIENT_CLINIC_OR_DEPARTMENT_OTHER): Payer: Medicaid Other | Admitting: Nurse Practitioner

## 2021-02-12 ENCOUNTER — Other Ambulatory Visit (HOSPITAL_BASED_OUTPATIENT_CLINIC_OR_DEPARTMENT_OTHER): Payer: Medicaid Other | Admitting: Nurse Practitioner

## 2021-02-12 DIAGNOSIS — J418 Mixed simple and mucopurulent chronic bronchitis: Secondary | ICD-10-CM | POA: Diagnosis not present

## 2021-02-12 DIAGNOSIS — I10 Essential (primary) hypertension: Secondary | ICD-10-CM

## 2021-02-12 DIAGNOSIS — E785 Hyperlipidemia, unspecified: Secondary | ICD-10-CM

## 2021-02-12 DIAGNOSIS — D729 Disorder of white blood cells, unspecified: Secondary | ICD-10-CM

## 2021-02-12 DIAGNOSIS — Z1211 Encounter for screening for malignant neoplasm of colon: Secondary | ICD-10-CM

## 2021-02-12 DIAGNOSIS — R7303 Prediabetes: Secondary | ICD-10-CM

## 2021-02-12 NOTE — Progress Notes (Signed)
Virtual Visit via Telephone Note Due to national recommendations of social distancing due to [place] 19, telehealth visit is felt to be most appropriate for this patient at this time.  I discussed the limitations, risks, security and privacy concerns of performing an evaluation and management service by telephone and the availability of in person appointments. I also discussed with the patient that there may be a patient responsible charge related to this service. The patient expressed understanding and agreed to proceed.    I connected with [name] on 02/12/21  at    EDT by telephone and verified that I am speaking with the correct person using two identifiers.  Location of Patient: Private Residence   Location of Provider: [place] [facility] [employer] Office    Persons participating in Telemedicine visit: Geryl Rankins FNP-BC [name]    History of Present Illness: Telemedicine visit for: Follow up to chronic conditions She has a past medical history of ASCVD, Cerebrovascular disease, Coronary artery disease, Diverticulitis, DJD of cervical spine, GERD, H. pylori infection, Hepatitis, Hypertension, Migraine headache, PVD, Shingles (11/21/2015), and Tobacco abuse.  HTN Poorly controlled. She has not followed up with Cardiology. She is currently prescribed clonidine 0.1 mg BID, carvedilol 12.5 mg BID, losartan 25 mg BID and furosemide 20 mg daily. She is an active smoker.    BP Readings from Last 3 Encounters:  05/04/20 (!) 226/85  11/08/19 (!) 187/91  07/06/19 (!) 211/102     Prediabetes Well controlled with diet only. Lipids (LDL)  not at goal. Would like to see around 70.  Lab Results  Component Value Date   HGBA1C 5.5 03/30/2020    Lab Results  Component Value Date   [place] 95 03/30/2020    COPD She does not endorse any worsening cough, wheezing or shortness of breath today.  Past Medical History:  Diagnosis Date   ASCVD (arteriosclerotic cardiovascular  disease)    CABG in 2006    Cerebrovascular disease    Coronary artery disease    Diverticulitis    DJD (degenerative joint disease) of cervical spine    GERD (gastroesophageal reflux disease)    H. pylori infection    Hepatitis    Hypertension    Migraine headache    PVD (peripheral vascular disease) ([place])    Shingles 11/21/2015   Tobacco abuse     Past Surgical History:  Procedure Laterality Date   ABDOMINAL HYSTERECTOMY     CAROTID ENDARTERECTOMY     Right   CHOLECYSTECTOMY     COLONOSCOPY     CORONARY ARTERY BYPASS GRAFT  2006   RIMA to the RCA, LIMA to the LAD, SVG to the circumflex   HEMORRHOID SURGERY     LEFT HEART CATH Bilateral 04/17/2013   Procedure: LEFT HEART CATH;  Surgeon: Lorretta Harp, MD;  Location: [facility] CATH LAB;  Service: Cardiovascular;  Laterality: Bilateral;   LEFT HEART CATHETERIZATION WITH CORONARY ANGIOGRAM N/A 09/27/2012   Procedure: LEFT HEART CATHETERIZATION WITH CORONARY ANGIOGRAM;  Surgeon: Lorretta Harp, MD;  Location: [facility] CATH LAB;  Service: Cardiovascular;  Laterality: N/A;    Family History  Problem Relation Age of Onset   Pneumonia Mother    Coronary artery disease Father    Heart disease Sister    Colon cancer Maternal Aunt        Great aunt, rectal   Diabetes Maternal Uncle    Heart disease Maternal Uncle    Breast cancer Maternal Grandmother    Heart  disease Other        Father's side of family    Social History   Socioeconomic History   Marital status: Single    Spouse name: Not on file   Number of children: Not on file   Years of education: Not on file   Highest education level: Not on file  Occupational History   Occupation: Merchant navy officer at [employer]    Employer: [employer]  Tobacco Use   Smoking status: Former    Packs/day: 0.50    Types: Cigarettes   Smokeless tobacco: Former    Quit date: 04/17/2013   Tobacco comments:    1/2 pack per day since 2005, 40 pack years  Vaping Use   Vaping  Use: Never used  Substance and Sexual Activity   Alcohol use: No   Drug use: No   Sexual activity: Yes  Other Topics Concern   Not on file  Social History Narrative   Divorced, no children   Social Determinants of Health   Financial Resource Strain: Not on file  Food Insecurity: Not on file  Transportation Needs: Not on file  Physical Activity: Not on file  Stress: Not on file  Social Connections: Not on file     Observations/Objective: Awake, alert and oriented x 3   Review of Systems  Constitutional:  Negative for fever, malaise/fatigue and weight loss.  HENT: Negative.  Negative for nosebleeds.   Eyes: Negative.  Negative for blurred vision, double vision and photophobia.  Respiratory: Negative.  Negative for cough and shortness of breath.   Cardiovascular: Negative.  Negative for chest pain, palpitations and leg swelling.  Gastrointestinal: Negative.  Negative for heartburn, nausea and vomiting.  Musculoskeletal: Negative.  Negative for myalgias.  Neurological: Negative.  Negative for dizziness, focal weakness, seizures and headaches.  Psychiatric/Behavioral: Negative.  Negative for suicidal ideas.    Assessment and Plan: Diagnoses and all orders for this visit:  Essential hypertension -     CMP14+EGFR  Colon cancer screening -     Fecal occult blood, imunochemical([facility])  Dyslipidemia, goal LDL below 100 -     Lipid panel  Prediabetes -     Hemoglobin A1c -     CMP14+EGFR  Abnormal neutrophil count -     CBC     Follow Up Instructions No follow-ups on file.     I discussed the assessment and treatment plan with the patient. The patient was provided an opportunity to ask questions and all were answered. The patient agreed with the plan and demonstrated an understanding of the instructions.   The patient was advised to call back or seek an in-person evaluation if the symptoms worsen or if the condition fails to improve as anticipated.  I  provided 10 minutes of non-face-to-face time during this encounter including median intraservice time, reviewing previous notes, labs, imaging, medications and explaining diagnosis and management.  Gildardo Pounds, FNP-BC

## 2021-02-12 NOTE — Progress Notes (Signed)
Virtual Visit via Telephone Note Due to national recommendations of social distancing due to COVID 19, telehealth visit is felt to be most appropriate for this patient at this time.  I discussed the limitations, risks, security and privacy concerns of performing an evaluation and management service by telephone and the availability of in person appointments. I also discussed with the patient that there may be a patient responsible charge related to this service. The patient expressed understanding and agreed to proceed.    I connected with [name] on 02/12/21  at   3:30 PM EST  EDT by telephone and verified that I am speaking with the correct person using two identifiers.  Location of Patient: Private Residence   [facility] of Provider[facility] [employer] [facility]    Persons participating in Telemedicine visit: [name] FNP-BC [name]    History of Present Illness: Telemedicine visit for: Virtual Visit via Telephone Note Due to national recommendations of social distancing due to COVID 19, telehealth visit is felt to be most appropriate for this patient at this time.  I discussed the limitations, risks, security and privacy concerns of performing an evaluation and management service by telephone and the availability of in person appointments. I also discussed with the patient that there may be a patient responsible charge related to this service. The patient expressed understanding and agreed to proceed.      I connected with [name] on 02/12/21  at    EDT by telephone and verified that I am speaking with the correct person using two identifiers.   Location of Patient: Private Residence   Location of Provider: [facility] [employer] [facility]    Persons participating in Telemedicine visit: [name] FNP-BC [name]    History of Present Illness: Telemedicine visit for: Follow up to chronic conditions She has a past medical  history of ASCVD, Cerebrovascular disease, Right CEA, Myocardial Infarction, Coronary artery disease, Diverticulitis, DJD of cervical spine, GERD, H. pylori infection, Hepatitis, Hypertension, Migraine headache, PVD, Shingles (11/21/2015), Tobacco abuse, peripheral vascular disease status post right femoropopliteal bypass,   HTN Poorly controlled. She has not followed up with Cardiology in a few years. I have encouraged her to do so at each visit. She is currently prescribed clonidine 0.1 mg BID, carvedilol 12.5 mg BID, losartan 25 mg BID and furosemide 20 mg daily. She is an active smoker.      BP Readings from Last 3 Encounters:  05/04/20 (!) 226/85  11/08/19 (!) 187/91  07/06/19 (!) 211/102      Prediabetes Well controlled with diet only. Lipids (LDL)  not at goal. Would like to see around 70.  Recent Labs       Lab Results  Component Value Date    HGBA1C 5.5 03/30/2020      Recent Labs       Lab Results  Component Value Date    LDLCALC 95 03/30/2020      COPD She does not endorse any worsening cough, wheezing or shortness of breath today.     Past Medical History:  Diagnosis Date   ASCVD (arteriosclerotic cardiovascular disease)    CABG in 2006    Cerebrovascular disease    Coronary artery disease    Diverticulitis    DJD (degenerative joint disease) of cervical spine    GERD (gastroesophageal reflux disease)    H. pylori infection    Hepatitis    Hypertension    Migraine headache  PVD (peripheral vascular disease) (HCC)    Shingles 11/21/2015   Tobacco abuse     Past Surgical History:  Procedure Laterality Date   ABDOMINAL HYSTERECTOMY     CAROTID ENDARTERECTOMY     Right   CHOLECYSTECTOMY     COLONOSCOPY     CORONARY ARTERY BYPASS GRAFT  2006   RIMA to the RCA, LIMA to the LAD, SVG to the circumflex   HEMORRHOID SURGERY     LEFT HEART CATH Bilateral 04/17/2013   Procedure: LEFT HEART CATH;  Surgeon: [name], MD;  Location: [facility] C[facility] LAB;   Service: Cardiovascular;  Laterality: Bilateral;   LEFT HEART CATHETERIZATION WITH CORONARY ANGIOGRAM N/A 09/27/2012   Procedure: LEFT HEART CATHETERIZATION WITH CORONARY ANGIOGRAM;  Surgeon: [name], MD;  Location: [facility] CATH LAB;  Service: Cardiovascular;  Laterality: N/A;    Family History  Problem Relation Age of Onset   Pneumonia Mother    Coronary artery disease Father    Heart disease Sister    Colon cancer Maternal Aunt        Great aunt, rectal   Diabetes Maternal Uncle    Heart disease Maternal Uncle    Breast cancer Maternal Grandmother    Heart disease Other        Father's side of family    Social History   Socioeconomic History   Marital status: Single    Spouse name: Not on file   Number of children: Not on file   Years of education: Not on file   Highest education level: Not on file  Occupational History   Occupation: Pensions consultant at [employer]    Employer: [employer]  Tobacco Use   Smoking status: Former    Packs/day: 0.50    Types: Cigarettes   Smokeless tobacco: Former    Quit date: 04/17/2013   Tobacco comments:    1/2 pack per day since 2005, 40 pack years  Vaping Use   Vaping Use: Never used  Substance and Sexual Activity   Alcohol use: No   Drug use: No   Sexual activity: Yes  Other Topics Concern   Not on file  Social History Narrative   Divorced, no children   Social Determinants of Health   Financial Resource Strain: Not on file  Food Insecurity: Not on file  Transportation Needs: Not on file  Physical Activity: Not on file  Stress: Not on file  Social Connections: Not on file     Observations/Objective: Awake, alert and oriented x 3   Review of Systems  Constitutional:  Negative for fever, malaise/fatigue and weight loss.  HENT: Negative.  Negative for nosebleeds.   Eyes: Negative.  Negative for blurred vision, double vision and photophobia.  Respiratory: Negative.  Negative for cough and shortness  of breath.   Cardiovascular: Negative.  Negative for chest pain, palpitations and leg swelling.  Gastrointestinal: Negative.  Negative for heartburn, nausea and vomiting.  Musculoskeletal: Negative.  Negative for myalgias.  Neurological: Negative.  Negative for dizziness, focal weakness, seizures and headaches.  Psychiatric/Behavioral: Negative.  Negative for suicidal ideas.    Assessment and Plan: Diagnoses and all orders for this visit:  Essential hypertension Continue all antihypertensives as prescribed.  Remember to bring in your blood pressure log with you for your follow up appointment.  DASH/Mediterranean Diets are healthier choices for HTN.    Dyslipidemia, goal LDL below 100 Continue atorvastatin daily INSTRUCTIONS: Work on a low fat, heart healthy diet and participate  in regular aerobic exercise program by working out at least 150 minutes per week; 5 days a week-30 minutes per day. Avoid red meat/beef/steak,  fried foods. junk foods, sodas, sugary drinks, unhealthy snacking, alcohol and smoking.  Drink at least 80 oz of water per day and monitor your carbohydrate intake daily.    Mixed simple and mucopurulent chronic bronchitis (HCC) Continue symbicort as prescribed  Prediabetes A1c pending    Follow Up Instructions Return in about 3 months (around 05/13/2021).     I discussed the assessment and treatment plan with the patient. The patient was provided an opportunity to ask questions and all were answered. The patient agreed with the plan and demonstrated an understanding of the instructions.   The patient was advised to call back or seek an in-person evaluation if the symptoms worsen or if the condition fails to improve as anticipated.  I provided 12 minutes of non-face-to-face time during this encounter including median intraservice time, reviewing previous notes, labs, imaging, medications and explaining diagnosis and management.  Claiborne Rigg, FNP-BC

## 2021-02-13 ENCOUNTER — Other Ambulatory Visit: Payer: Self-pay

## 2021-02-13 ENCOUNTER — Ambulatory Visit: Payer: Medicaid Other | Attending: Nurse Practitioner

## 2021-02-14 LAB — CMP14+EGFR
ALT: 11 IU/L (ref 0–32)
AST: 14 IU/L (ref 0–40)
Albumin/Globulin Ratio: 2.1 (ref 1.2–2.2)
Albumin: 4.4 g/dL (ref 3.8–4.8)
Alkaline Phosphatase: 105 IU/L (ref 44–121)
BUN/Creatinine Ratio: 18 (ref 12–28)
BUN: 21 mg/dL (ref 8–27)
Bilirubin Total: 0.5 mg/dL (ref 0.0–1.2)
CO2: 23 mmol/L (ref 20–29)
Calcium: 9.2 mg/dL (ref 8.7–10.3)
Chloride: 105 mmol/L (ref 96–106)
Creatinine, Ser: 1.2 mg/dL — ABNORMAL HIGH (ref 0.57–1.00)
Globulin, Total: 2.1 g/dL (ref 1.5–4.5)
Glucose: 92 mg/dL (ref 70–99)
Potassium: 4.8 mmol/L (ref 3.5–5.2)
Sodium: 142 mmol/L (ref 134–144)
Total Protein: 6.5 g/dL (ref 6.0–8.5)
eGFR: 49 mL/min/{1.73_m2} — ABNORMAL LOW (ref >59)

## 2021-02-14 LAB — LIPID PANEL
Chol/HDL Ratio: 2.8 ratio (ref 0.0–4.4)
Cholesterol, Total: 149 mg/dL (ref 100–199)
HDL: 54 mg/dL (ref >39)
LDL Chol Calc (NIH): 76 mg/dL (ref 0–99)
Triglycerides: 104 mg/dL (ref 0–149)
VLDL Cholesterol Cal: 19 mg/dL (ref 5–40)

## 2021-02-14 LAB — CBC
Hematocrit: 35 % (ref 34.0–46.6)
Hemoglobin: 11.7 g/dL (ref 11.1–15.9)
MCH: 28.5 pg (ref 26.6–33.0)
MCHC: 33.4 g/dL (ref 31.5–35.7)
MCV: 85 fL (ref 79–97)
Platelets: 191 10*3/uL (ref 150–450)
RBC: 4.1 x10E6/uL (ref 3.77–5.28)
RDW: 13.3 % (ref 11.7–15.4)
WBC: 4.3 10*3/uL (ref 3.4–10.8)

## 2021-02-14 LAB — HEMOGLOBIN A1C
Est. average glucose Bld gHb Est-mCnc: 114 mg/dL
Hgb A1c MFr Bld: 5.6 % (ref 4.8–5.6)

## 2021-02-15 ENCOUNTER — Other Ambulatory Visit: Payer: Self-pay | Admitting: Nurse Practitioner

## 2021-02-15 DIAGNOSIS — I10 Essential (primary) hypertension: Secondary | ICD-10-CM

## 2021-02-15 NOTE — Telephone Encounter (Signed)
Requested Prescriptions  Pending Prescriptions Disp Refills   cloNIDine (CATAPRES) 0.1 MG tablet [Pharmacy Med Name: CLONIDINE 0.1MG  TABLETS] 180 tablet 1    Sig: TAKE 1 TABLET(0.1 MG) BY MOUTH TWICE DAILY FOR BLOOD PRESSURE     Cardiovascular:  Alpha-2 Agonists Failed - 02/15/2021 11:50 AM      Failed - Last BP in normal range    BP Readings from Last 1 Encounters:  05/04/20 (!) 226/85         Passed - Last Heart Rate in normal range    Pulse Readings from Last 1 Encounters:  05/04/20 (!) 53         Passed - Valid encounter within last 6 months    Recent Outpatient Visits          3 days ago Essential hypertension   [facility] [place], Shea Stakes, NP   9 months ago Essential hypertension   [place] [place], Shea Stakes, NP   10 months ago Essential hypertension   [facility] [place], Shea Stakes, NP   1 year ago Essential hypertension   [place] [place], Shea Stakes, NP   1 year ago Essential hypertension   [facility] [place], Shea Stakes, NP

## 2021-02-16 ENCOUNTER — Other Ambulatory Visit: Payer: Self-pay | Admitting: Nurse Practitioner

## 2021-02-16 DIAGNOSIS — I2510 Atherosclerotic heart disease of native coronary artery without angina pectoris: Secondary | ICD-10-CM

## 2021-02-16 NOTE — Telephone Encounter (Signed)
Requested Prescriptions  Pending Prescriptions Disp Refills   atorvastatin (LIPITOR) 80 MG tablet [Pharmacy Med Name: ATORVASTATIN 80MG  TABLETS] 90 tablet 1    Sig: TAKE 1 TABLET(80 MG) BY MOUTH DAILY AT 6 PM     Cardiovascular:  Antilipid - Statins Passed - 02/16/2021  3:08 AM      Passed - Total Cholesterol in normal range and within 360 days    Cholesterol, Total  Date Value Ref Range Status  02/13/2021 149 100 - 199 mg/dL Final         Passed - LDL in normal range and within 360 days    LDL Chol Calc (NIH)  Date Value Ref Range Status  02/13/2021 76 0 - 99 mg/dL Final         Passed - HDL in normal range and within 360 days    HDL  Date Value Ref Range Status  02/13/2021 54 >39 mg/dL Final         Passed - Triglycerides in normal range and within 360 days    Triglycerides  Date Value Ref Range Status  02/13/2021 104 0 - 149 mg/dL Final         Passed - Patient is not pregnant      Passed - Valid encounter within last 12 months    Recent Outpatient Visits          4 days ago Essential hypertension   [place], Vernia Buff, NP   9 months ago Essential hypertension   [place], Vernia Buff, NP   11 months ago Essential hypertension   [place], Vernia Buff, NP   1 year ago Essential hypertension   [place], Vernia Buff, NP   1 year ago Essential hypertension   [place], Vernia Buff, NP

## 2021-02-17 ENCOUNTER — Other Ambulatory Visit: Payer: Self-pay | Admitting: Nurse Practitioner

## 2021-02-17 DIAGNOSIS — I10 Essential (primary) hypertension: Secondary | ICD-10-CM

## 2021-02-17 DIAGNOSIS — I2510 Atherosclerotic heart disease of native coronary artery without angina pectoris: Secondary | ICD-10-CM

## 2021-02-17 DIAGNOSIS — I5032 Chronic diastolic (congestive) heart failure: Secondary | ICD-10-CM

## 2021-02-17 MED ORDER — CARVEDILOL 12.5 MG PO TABS: 12.5000 mg | ORAL_TABLET | Freq: Two times a day (BID) | ORAL | 1 refills | Status: DC

## 2021-02-17 MED ORDER — LOSARTAN POTASSIUM 25 MG PO TABS: 25.0000 mg | ORAL_TABLET | Freq: Two times a day (BID) | ORAL | 1 refills | Status: DC

## 2021-02-17 MED ORDER — CLOPIDOGREL BISULFATE 75 MG PO TABS: 75.0000 mg | ORAL_TABLET | Freq: Every day | ORAL | 3 refills | Status: AC

## 2021-02-17 MED ORDER — NITROGLYCERIN 0.4 MG SL SUBL: 0.4000 mg | SUBLINGUAL_TABLET | SUBLINGUAL | 3 refills | Status: DC | PRN

## 2021-03-19 ENCOUNTER — Other Ambulatory Visit: Payer: Self-pay | Admitting: Nurse Practitioner

## 2021-03-19 DIAGNOSIS — I10 Essential (primary) hypertension: Secondary | ICD-10-CM

## 2021-03-19 DIAGNOSIS — I5032 Chronic diastolic (congestive) heart failure: Secondary | ICD-10-CM

## 2021-08-14 ENCOUNTER — Other Ambulatory Visit: Payer: Self-pay | Admitting: Nurse Practitioner

## 2021-08-14 DIAGNOSIS — I10 Essential (primary) hypertension: Secondary | ICD-10-CM

## 2021-09-13 ENCOUNTER — Other Ambulatory Visit: Payer: Self-pay | Admitting: Nurse Practitioner

## 2021-09-13 DIAGNOSIS — I10 Essential (primary) hypertension: Secondary | ICD-10-CM

## 2021-09-13 DIAGNOSIS — I5032 Chronic diastolic (congestive) heart failure: Secondary | ICD-10-CM

## 2021-09-13 NOTE — Telephone Encounter (Signed)
Requested medication (s) are due for refill today:   Yes   Requested medication (s) are on the active medication list:   Yes  Future visit scheduled:   Yes 11/08/2021   Last ordered: 02/17/2021 #180, 0 refills  Returned because labs are due and no OV within 6 months however does have appt 11/08/2021.  Provider to review for refills prior to appt.   Requested Prescriptions  Pending Prescriptions Disp Refills   carvedilol (COREG) 12.5 MG tablet [Pharmacy Med Name: CARVEDILOL 12.5MG  TABLETS] 180 tablet 1    Sig: TAKE 1 TABLET(12.5 MG) BY MOUTH TWICE DAILY WITH A MEAL     Cardiovascular: Beta Blockers 3 Failed - 09/13/2021  6:13 AM      Failed - Cr in normal range and within 360 days    Creat  Date Value Ref Range Status  06/07/2015 1.22 (H) 0.50 - 0.99 mg/dL Final   Creatinine, Ser  Date Value Ref Range Status  02/13/2021 1.20 (H) 0.57 - 1.00 mg/dL Final         Failed - Last BP in normal range    BP Readings from Last 1 Encounters:  05/04/20 (!) 226/85         Failed - Valid encounter within last 6 months    Recent Outpatient Visits           7 months ago Essential hypertension   [facility] [place], Shea Stakes, NP   1 year ago Essential hypertension   [place] [facility] [place], Shea Stakes, NP   1 year ago Essential hypertension   [facility] [place], Shea Stakes, NP   1 year ago Essential hypertension    [facility] [place], Shea Stakes, NP   1 year ago Essential hypertension    [facility] [place], Shea Stakes, NP       Future Appointments             In 1 month Claiborne Rigg, NP  [facility]            Passed - AST in normal range and within 360 days    AST  Date Value Ref Range Status  02/13/2021 14 0 - 40 IU/L Final         Passed - ALT in normal range and within 360 days    ALT  Date Value Ref  Range Status  02/13/2021 11 0 - 32 IU/L Final         Passed - Last Heart Rate in normal range    Pulse Readings from Last 1 Encounters:  05/04/20 (!) 53

## 2021-11-07 ENCOUNTER — Telehealth: Payer: Self-pay | Admitting: Emergency Medicine

## 2021-11-07 NOTE — Telephone Encounter (Signed)
Copied from [place] [id number]. Topic: Appointment Scheduling - Scheduling Inquiry for Clinic >> Nov 07, 2021  1:17 PM Leilani Able wrote: Pt had put in a request for rescheduling her appt with Zelda on 10/5 and stated she could only do Fridays. She wanted the appt cancelled that she was given, it is not convenient, not Friday. There are no appt with any provider available till January. She wants to mention that she has had heart surgury and has issues with BP which she says is always a struggle even with meds. She wants a Friday and not able to sch a Friday. She wants fu / to work her into a Friday and standing firm. Please contact pt, [phone]. She has no appt at all now. She said if you cannot give her a appt can you refer her to the heart dr she used to go to Cardiologist, Daneen Schick?

## 2021-11-07 NOTE — Telephone Encounter (Signed)
Unable to reach patient by phone to talk about her current issue regarding her appointment needing to be rescheduled x2.

## 2021-11-08 ENCOUNTER — Ambulatory Visit: Payer: Medicaid Other | Admitting: Nurse Practitioner

## 2021-12-12 ENCOUNTER — Other Ambulatory Visit: Payer: Self-pay | Admitting: Family Medicine

## 2021-12-12 DIAGNOSIS — I5032 Chronic diastolic (congestive) heart failure: Secondary | ICD-10-CM

## 2021-12-12 DIAGNOSIS — I10 Essential (primary) hypertension: Secondary | ICD-10-CM

## 2021-12-12 NOTE — Telephone Encounter (Signed)
Requested medication (s) are due for refill today:   Yes  Requested medication (s) are on the active medication list:   Yes  Future visit scheduled:   No  Had appt. 11/08/2021 that was cancelled by provider;   Pt. Cancelled appt. For 12/24/2021 with Meredeth Ide.    Last ordered: 10/14/16 g, 1 refill  Returned due to cancelled appts.  [facility] has attempted to contact her without success.   Provider to review for refills.     Requested Prescriptions  Pending Prescriptions Disp Refills   carvedilol (COREG) 12.5 MG tablet [Pharmacy Med Name: CARVEDILOL 12.5MG  TABLETS] 180 tablet 0    Sig: TAKE 1 TABLET(12.5 MG) BY MOUTH TWICE DAILY WITH A MEAL     Cardiovascular: Beta Blockers 3 Failed - 12/12/2021  6:18 AM      Failed - Cr in normal range and within 360 days    Creat  Date Value Ref Range Status  06/07/2015 1.22 (H) 0.50 - 0.99 mg/dL Final   Creatinine, Ser  Date Value Ref Range Status  02/13/2021 1.20 (H) 0.57 - 1.00 mg/dL Final         Failed - Last BP in normal range    BP Readings from Last 1 Encounters:  05/04/20 (!) 226/85         Failed - Valid encounter within last 6 months    Recent Outpatient Visits           10 months ago Essential hypertension   [facility] [place], Shea Stakes, NP   1 year ago Essential hypertension   [place] [facility] [facility] [place], Shea Stakes, NP   1 year ago Essential hypertension   [place] [facility] [place], Shea Stakes, NP   2 years ago Essential hypertension   [place] [facility] [place], [place] W, NP   2 years ago Essential hypertension    [place] [facility] [place], [place] W, NP              Passed - AST in normal range and within 360 days    AST  Date Value Ref Range Status  02/13/2021 14 0 - 40 IU/L Final         Passed - ALT in normal range and within 360 days    ALT  Date Value Ref Range Status  02/13/2021 11 0 - 32 IU/L Final          Passed - Last Heart Rate in normal range    Pulse Readings from Last 1 Encounters:  05/04/20 (!) 53

## 2021-12-24 ENCOUNTER — Ambulatory Visit: Payer: Medicaid Other | Admitting: Nurse Practitioner

## 2022-01-31 ENCOUNTER — Encounter: Payer: Self-pay | Admitting: Internal Medicine

## 2022-01-31 ENCOUNTER — Ambulatory Visit: Payer: Medicaid Other | Admitting: Internal Medicine

## 2022-01-31 VITALS — BP 215/137 | HR 97 | Temp 97.9°F | Ht 68.0 in | Wt 207.2 lb

## 2022-01-31 DIAGNOSIS — I10 Essential (primary) hypertension: Secondary | ICD-10-CM

## 2022-01-31 DIAGNOSIS — I2510 Atherosclerotic heart disease of native coronary artery without angina pectoris: Secondary | ICD-10-CM | POA: Diagnosis not present

## 2022-01-31 DIAGNOSIS — F418 Other specified anxiety disorders: Secondary | ICD-10-CM

## 2022-01-31 DIAGNOSIS — Z87891 Personal history of nicotine dependence: Secondary | ICD-10-CM

## 2022-01-31 DIAGNOSIS — E669 Obesity, unspecified: Secondary | ICD-10-CM

## 2022-01-31 DIAGNOSIS — J418 Mixed simple and mucopurulent chronic bronchitis: Secondary | ICD-10-CM

## 2022-01-31 DIAGNOSIS — Z955 Presence of coronary angioplasty implant and graft: Secondary | ICD-10-CM

## 2022-01-31 DIAGNOSIS — E782 Mixed hyperlipidemia: Secondary | ICD-10-CM

## 2022-01-31 DIAGNOSIS — N1831 Chronic kidney disease, stage 3a: Secondary | ICD-10-CM

## 2022-01-31 DIAGNOSIS — F419 Anxiety disorder, unspecified: Secondary | ICD-10-CM

## 2022-01-31 DIAGNOSIS — I5032 Chronic diastolic (congestive) heart failure: Secondary | ICD-10-CM

## 2022-01-31 HISTORY — DX: Presence of coronary angioplasty implant and graft: Z95.5

## 2022-01-31 MED ORDER — BUSPIRONE HCL 10 MG PO TABS: ORAL_TABLET | ORAL | 3 refills | Status: DC

## 2022-01-31 MED ORDER — CARVEDILOL 12.5 MG PO TABS: ORAL_TABLET | ORAL | 3 refills | Status: DC

## 2022-01-31 MED ORDER — NITROGLYCERIN 0.4 MG SL SUBL: 0.4000 mg | SUBLINGUAL_TABLET | SUBLINGUAL | 3 refills | Status: DC | PRN

## 2022-01-31 MED ORDER — LOSARTAN POTASSIUM 25 MG PO TABS: ORAL_TABLET | ORAL | 3 refills | Status: DC

## 2022-01-31 MED ORDER — CLOPIDOGREL BISULFATE 75 MG PO TABS: 75.0000 mg | ORAL_TABLET | Freq: Every day | ORAL | 3 refills | Status: DC

## 2022-01-31 MED ORDER — ISOSORBIDE MONONITRATE ER 30 MG PO TB24: 30.0000 mg | ORAL_TABLET | Freq: Every day | ORAL | 3 refills | Status: DC

## 2022-01-31 MED ORDER — ATORVASTATIN CALCIUM 80 MG PO TABS: ORAL_TABLET | ORAL | 3 refills | Status: DC

## 2022-01-31 NOTE — Progress Notes (Signed)
[place]  Phone: [phone]  New patient visit  Visit Date: 01/31/2022 Patient: [name]   DOB: [birth year]   71 y.o. Female  MRN: [id number]  Today's healthcare provider: Loralee Pacas, MD  Assessment and Plan:   New patient establish care due to she is transferring her care today  from [place] Provider (PCP) Geryl Rankins to [facility] Primary Care Provider (PCP) Berniece Pap. Blood pressure high here but good on home checks. Needs medication refills.  We reviewed all problems and updated  and refilled all medications today.  Shanetra was seen today for establish care, medication refill and blood pressure check.  Stented coronary artery Overview: She guesses the plan is lifelong Plavix she had a wallet full of cards for all of the stents  Orders: -     Clopidogrel Bisulfate; Take 1 tablet (75 mg total) by mouth daily.  Dispense: 90 tablet; Refill: 3 -     Ambulatory referral to Cardiology  Mixed simple and mucopurulent chronic bronchitis ([place]) Overview: Quit smoking last cigarette 2022 Not following with pulmonology.    History of smoking 10-25 pack years Assessment & Plan: She denies hemoptysis weight loss she does not think she ever smoked more than 25 pack years and so does not qualify for lung cancer screening by current insurance guidelines and despite this I offered to order the CAT scan anyway although it would be out of her pocket and she declined after shared decision-making process.    Mixed hyperlipidemia Overview: Associated with cad and myocardial infarction and cabg and stents Lipid Panel     Component Value Date/Time   CHOL 149 02/13/2021 0952   TRIG 104 02/13/2021 0952   HDL 54 02/13/2021 0952   CHOLHDL 2.8 02/13/2021 0952   CHOLHDL 5.8 (H) 06/07/2015 1007   VLDL 66 (H) 06/07/2015 1007   LDLCALC 76 02/13/2021 0952   LABVLDL 19 02/13/2021 0952      Assessment & Plan: We went over her  cholesterol profile and her current regimen of atorvastatin 80 mg which I said is probably the best she is going to be able to get affordably right now.  I did mention that the PCSK9 inhibitors could lower her cholesterol further but she is pretty close to goal according to current guidelines and so insurance is unlikely to cover to go up on the medicine although I am willing to send it in and find out if she is interested. She declined  I did encourage her to take the following superfoods for preventing heart attack #1 avocados #2 extra virgin olive oil #3 fish oil #4 fiber supplements #5 plant based cholesterols and fats such as soft nuts for example [place] nuts and pecans   Chronic diastolic congestive heart failure (HCC) Overview: Intermittent legs on swelling Following with cardiology Dr. Gwenlyn Found  Orders: -     Carvedilol; TAKE 1 TABLET(12.5 MG) BY MOUTH TWICE DAILY WITH A MEAL  Dispense: 180 tablet; Refill: 3  Coronary artery disease involving native coronary artery of native heart without angina pectoris -     Nitroglycerin; Place 1 tablet (0.4 mg total) under the tongue every 5 (five) minutes x 3 doses as needed for chest pain.  Dispense: 25 tablet; Refill: 3 -     Atorvastatin Calcium; TAKE 1 TABLET(80 MG) BY MOUTH DAILY AT 6 PM  Dispense: 90 tablet; Refill: 3  Chronic diastolic congestive heart failure (HCC) Overview: Intermittent legs on swelling Following with cardiology  Dr. Gwenlyn Found  Orders: -     Carvedilol; TAKE 1 TABLET(12.5 MG) BY MOUTH TWICE DAILY WITH A MEAL  Dispense: 180 tablet; Refill: 3  Hypertension, unspecified type Assessment & Plan: Blood pressure very high on repeat today but she shows me home readings in the 1 30-1 40 range on her current medications so I am not going to adjust it today I just gave her instructions to go to the ER if she starts getting headaches shortness of breath or worsening of her known heart failure with high blood pressures at home but I  think this is just whitecoat hypertension she will keep an eye on it at home I asked her to also send in some readings on MyChart because there is count   I reviewed her meds with her and I have a suspicion that the clonidine is probably the cause or specifically the fact that she takes it twice a day but it only last a few hours and so it is probably normalizing the blood pressure and then it is spiking back up again I asked her to check this at home and see if that is the case and if so I am going to switch out the clonidine for something more long-acting  For now can sinew the medications unchanged which are carvedilol 12.5 mg p.o. twice daily, clonidine 0.1 mg p.o. twice daily losartan 25 mg p.o. daily and she has nitro tablets but these are not playing a role  Orders: -     Carvedilol; TAKE 1 TABLET(12.5 MG) BY MOUTH TWICE DAILY WITH A MEAL  Dispense: 180 tablet; Refill: 3 -     Isosorbide Mononitrate ER; Take 1 tablet (30 mg total) by mouth daily. Replaces clonidine  Dispense: 90 tablet; Refill: 3 -     CBC; Future -     Comprehensive metabolic panel; Future -     Lipid panel; Future  Anxiety -     busPIRone HCl; TAKE 1 TABLET(10 MG) BY MOUTH THREE TIMES DAILY  Dispense: 180 tablet; Refill: 3 -     CBC; Future -     Comprehensive metabolic panel; Future -     Lipid panel; Future  Other specified anxiety disorders  Obesity (BMI 30.0-34.9)  Stage 3a chronic kidney disease (HCC) -     Magnesium; Future -     Uric acid; Future -     Urinalysis, Routine w reflex microscopic; Future -     VITAMIN D 25 Hydroxy (Vit-D Deficiency, Fractures); Future -     Phosphorus; Future  Other orders -     Losartan Potassium; TAKE 1 TABLET(25 MG) BY MOUTH TWICE DAILY  Dispense: 180 tablet; Refill: 3    Subjective:  Patient presents today to establish care.  Chief Complaint  Patient presents with   Establish Care   Medication Refill    Plavix.   Blood Pressure Check    Problem-oriented  charting was used to develop and update her medical history: Problem  History of Smoking 10-25 Pack Years  Hypertensive Disorder  Hyperlipidemia   Associated with cad and myocardial infarction and cabg and stents Lipid Panel     Component Value Date/Time   CHOL 149 02/13/2021 0952   TRIG 104 02/13/2021 0952   HDL 54 02/13/2021 0952   CHOLHDL 2.8 02/13/2021 0952   CHOLHDL 5.8 (H) 06/07/2015 1007   VLDL 66 (H) 06/07/2015 1007   LDLCALC 76 02/13/2021 0952   LABVLDL 19 02/13/2021 8413  Depression Screen    01/31/2022    1:17 PM 05/04/2020   11:09 AM 07/06/2019    3:01 PM 09/20/2018    2:37 PM  PHQ 2/9 Scores  PHQ - 2 Score 0 0 0 0  PHQ- 9 Score  0 0 0   No results found for any visits on 01/31/22.  The following were reviewed and entered/updated into her MEDICAL RECORD NUMBERPast Medical History:  Diagnosis Date   ASCVD (arteriosclerotic cardiovascular disease)    CABG in 2006    Cerebrovascular disease    Coronary artery disease    Diverticulitis    DJD (degenerative joint disease) of cervical spine    GERD (gastroesophageal reflux disease)    H. pylori infection    Hepatitis    Hypertension    Migraine headache    PVD (peripheral vascular disease) ([place])    Shingles 11/21/2015   Stented coronary artery 01/31/2022   She guesses the plan is lifelong Plavix she had a wallet full of cards for all of the stents   Tobacco abuse    Past Surgical History:  Procedure Laterality Date   ABDOMINAL HYSTERECTOMY     CAROTID ENDARTERECTOMY     Right   CHOLECYSTECTOMY     COLONOSCOPY     CORONARY ARTERY BYPASS GRAFT  2006   RIMA to the RCA, LIMA to the LAD, SVG to the circumflex   HEMORRHOID SURGERY     LEFT HEART CATH Bilateral 04/17/2013   Procedure: LEFT HEART CATH;  Surgeon: Lorretta Harp, MD;  Location: [facility] CATH LAB;  Service: Cardiovascular;  Laterality: Bilateral;   LEFT HEART CATHETERIZATION WITH CORONARY ANGIOGRAM N/A 09/27/2012   Procedure: LEFT HEART  CATHETERIZATION WITH CORONARY ANGIOGRAM;  Surgeon: Lorretta Harp, MD;  Location: [facility] CATH LAB;  Service: Cardiovascular;  Laterality: N/A;   Family History  Problem Relation Age of Onset   Pneumonia Mother    Coronary artery disease Father    Heart disease Sister    Colon cancer Maternal Aunt        Great aunt, rectal   Diabetes Maternal Uncle    Heart disease Maternal Uncle    Breast cancer Maternal Grandmother    Heart disease Other        Father's side of family   Outpatient Medications Prior to Visit  Medication Sig Dispense Refill   aspirin 81 MG tablet Take 1 tablet (81 mg total) by mouth daily. Reported on 06/07/2015 90 tablet 3   Esomeprazole Magnesium (NEXIUM PO) Nexium     atorvastatin (LIPITOR) 80 MG tablet TAKE 1 TABLET(80 MG) BY MOUTH DAILY AT 6 PM 90 tablet 1   busPIRone (BUSPAR) 10 MG tablet TAKE 1 TABLET(10 MG) BY MOUTH THREE TIMES DAILY 60 tablet 2   carvedilol (COREG) 12.5 MG tablet TAKE 1 TABLET(12.5 MG) BY MOUTH TWICE DAILY WITH A MEAL 180 tablet 0   cloNIDine (CATAPRES) 0.1 MG tablet TAKE 1 TABLET(0.1 MG) BY MOUTH TWICE DAILY FOR BLOOD PRESSURE 180 tablet 0   clopidogrel (PLAVIX) 75 MG tablet Take 75 mg by mouth daily.     losartan (COZAAR) 25 MG tablet TAKE 1 TABLET(25 MG) BY MOUTH TWICE DAILY 180 tablet 0   nitroGLYCERIN (NITROSTAT) 0.4 MG SL tablet Place 1 tablet (0.4 mg total) under the tongue every 5 (five) minutes x 3 doses as needed for chest pain. 25 tablet 3   furosemide (LASIX) 20 MG tablet Take 1 tablet (20 mg total) by mouth daily. 90 tablet 1  pantoprazole (PROTONIX) 40 MG tablet Take 1 tablet (40 mg total) by mouth daily. 90 tablet 1   SYMBICORT 80-4.5 MCG/ACT inhaler INHALE 2 PUFFS INTO THE LUNGS TWICE DAILY 10.2 g 5   No facility-administered medications prior to visit.    Allergies  Allergen Reactions   Ace Inhibitors Cough   Codeine Itching    welts   Morphine Itching    Welts    Social History   Tobacco Use   Smoking status: Former     Packs/day: 0.50    Types: Cigarettes   Smokeless tobacco: Former    Quit date: 04/17/2013   Tobacco comments:    1/2 pack per day since 2005, 40 pack years  Vaping Use   Vaping Use: Never used  Substance Use Topics   Alcohol use: No   Drug use: No    There is no immunization history for the selected administration types on file for this patient.  Objective:  BP (!) 215/137 (BP Location: Right Arm, Patient Position: Sitting)   Pulse 97   Temp 97.9 F (36.6 C) (Temporal)   Ht _0  (1.727 m)   Wt 207 lb 3.2 oz (94 kg)   SpO2 97%   BMI 31.50 kg/m  Body mass index is 31.5 kg/m.  Physical Exam  Vital signs reviewed.  Nursing notes reviewed. General Appearance/Constitutional: no acute distress Musculoskeletal: All extremities are intact.  Neurological:  Awake, alert,  No obvious focal neurological deficits or cognitive impairments Psychiatric:  Appropriate mood, pleasant demeanor     Results Reviewed: Results for orders placed or performed in visit on 02/12/21  Hemoglobin A1c  Result Value Ref Range   Hgb A1c MFr Bld 5.6 4.8 - 5.6 %   Est. average glucose Bld gHb Est-mCnc 114 mg/dL  CMP14+EGFR  Result Value Ref Range   Glucose 92 70 - 99 mg/dL   BUN 21 8 - 27 mg/dL   Creatinine, Ser 1.20 (H) 0.57 - 1.00 mg/dL   eGFR 49 (L) >59 mL/min/1.73   BUN/Creatinine Ratio 18 12 - 28   Sodium 142 134 - 144 mmol/L   Potassium 4.8 3.5 - 5.2 mmol/L   Chloride 105 96 - 106 mmol/L   CO2 23 20 - 29 mmol/L   Calcium 9.2 8.7 - 10.3 mg/dL   Total Protein 6.5 6.0 - 8.5 g/dL   Albumin 4.4 3.8 - 4.8 g/dL   Globulin, Total 2.1 1.5 - 4.5 g/dL   Albumin/Globulin Ratio 2.1 1.2 - 2.2   Bilirubin Total 0.5 0.0 - 1.2 mg/dL   Alkaline Phosphatase 105 44 - 121 IU/L   AST 14 0 - 40 IU/L   ALT 11 0 - 32 IU/L  CBC  Result Value Ref Range   WBC 4.3 3.4 - 10.8 x10E3/uL   RBC 4.10 3.77 - 5.28 x10E6/uL   Hemoglobin 11.7 11.1 - 15.9 g/dL   Hematocrit 35.0 34.0 - 46.6 %   MCV 85 79 - 97 fL    MCH 28.5 26.6 - 33.0 pg   MCHC 33.4 31.5 - 35.7 g/dL   RDW 13.3 11.7 - 15.4 %   Platelets 191 150 - 450 x10E3/uL  Lipid panel  Result Value Ref Range   Cholesterol, Total 149 100 - 199 mg/dL   Triglycerides 104 0 - 149 mg/dL   HDL 54 >39 mg/dL   VLDL Cholesterol Cal 19 5 - 40 mg/dL   LDL Chol Calc (NIH) 76 0 - 99 mg/dL   Chol/HDL Ratio 2.8 0.0 -  4.4 ratio

## 2022-01-31 NOTE — Assessment & Plan Note (Signed)
Blood pressure very high on repeat today but she shows me home readings in the 1 30-1 40 range on her current medications so I am not going to adjust it today I just gave her instructions to go to the ER if she starts getting headaches shortness of breath or worsening of her known heart failure with high blood pressures at home but I think this is just whitecoat hypertension she will keep an eye on it at home I asked her to also send in some readings on MyChart because there is count   I reviewed her meds with her and I have a suspicion that the clonidine is probably the cause or specifically the fact that she takes it twice a day but it only last a few hours and so it is probably normalizing the blood pressure and then it is spiking back up again I asked her to check this at home and see if that is the case and if so I am going to switch out the clonidine for something more long-acting  For now can sinew the medications unchanged which are carvedilol 12.5 mg p.o. twice daily, clonidine 0.1 mg p.o. twice daily losartan 25 mg p.o. daily and she has nitro tablets but these are not playing a role

## 2022-01-31 NOTE — Assessment & Plan Note (Signed)
We went over her cholesterol profile and her current regimen of atorvastatin 80 mg which I said is probably the best she is going to be able to get affordably right now.  I did mention that the PCSK9 inhibitors could lower her cholesterol further but she is pretty close to goal according to current guidelines and so insurance is unlikely to cover to go up on the medicine although I am willing to send it in and find out if she is interested. She declined  I did encourage her to take the following superfoods for preventing heart attack #1 avocados #2 extra virgin olive oil #3 fish oil #4 fiber supplements #5 plant based cholesterols and fats such as soft nuts for example [place] nuts and pecans

## 2022-01-31 NOTE — Assessment & Plan Note (Signed)
She denies hemoptysis weight loss she does not think she ever smoked more than 25 pack years and so does not qualify for lung cancer screening by current insurance guidelines and despite this I offered to order the CAT scan anyway although it would be out of her pocket and she declined after shared decision-making process.

## 2022-01-31 NOTE — Patient Instructions (Signed)
Thank you for coming to see me today please give him a 5 stop review.  I would love to see you in 3 to 6 months sooner if you are up for it but I will see be happy to take care of your medical needs going forward

## 2022-02-14 ENCOUNTER — Other Ambulatory Visit (INDEPENDENT_AMBULATORY_CARE_PROVIDER_SITE_OTHER): Payer: Medicaid Other

## 2022-02-14 DIAGNOSIS — I10 Essential (primary) hypertension: Secondary | ICD-10-CM | POA: Diagnosis not present

## 2022-02-14 DIAGNOSIS — N1831 Chronic kidney disease, stage 3a: Secondary | ICD-10-CM | POA: Diagnosis not present

## 2022-02-14 DIAGNOSIS — F419 Anxiety disorder, unspecified: Secondary | ICD-10-CM | POA: Diagnosis not present

## 2022-02-14 LAB — URINALYSIS, ROUTINE W REFLEX MICROSCOPIC
Hgb urine dipstick: NEGATIVE
Ketones, ur: NEGATIVE
Leukocytes,Ua: NEGATIVE
Nitrite: NEGATIVE
Specific Gravity, Urine: 1.02 (ref 1.000–1.030)
Total Protein, Urine: 30 — AB
Urine Glucose: NEGATIVE
Urobilinogen, UA: 1 (ref 0.0–1.0)
pH: 6 (ref 5.0–8.0)

## 2022-02-14 LAB — LIPID PANEL
Cholesterol: 130 mg/dL (ref 0–200)
HDL: 45.1 mg/dL (ref >39.00)
LDL Cholesterol: 62 mg/dL (ref 0–99)
NonHDL: 84.62
Total CHOL/HDL Ratio: 3
Triglycerides: 114 mg/dL (ref 0.0–149.0)
VLDL: 22.8 mg/dL (ref 0.0–40.0)

## 2022-02-14 LAB — VITAMIN D 25 HYDROXY (VIT D DEFICIENCY, FRACTURES): VITD: 14.35 ng/mL — ABNORMAL LOW (ref 30.00–100.00)

## 2022-02-14 LAB — COMPREHENSIVE METABOLIC PANEL
ALT: 11 U/L (ref 0–35)
AST: 14 U/L (ref 0–37)
Albumin: 4.6 g/dL (ref 3.5–5.2)
Alkaline Phosphatase: 103 U/L (ref 39–117)
BUN: 28 mg/dL — ABNORMAL HIGH (ref 6–23)
CO2: 26 mEq/L (ref 19–32)
Calcium: 9.4 mg/dL (ref 8.4–10.5)
Chloride: 105 mEq/L (ref 96–112)
Creatinine, Ser: 1.55 mg/dL — ABNORMAL HIGH (ref 0.40–1.20)
GFR: 33.49 mL/min — ABNORMAL LOW (ref >60.00)
Glucose, Bld: 103 mg/dL — ABNORMAL HIGH (ref 70–99)
Potassium: 4.9 mEq/L (ref 3.5–5.1)
Sodium: 140 mEq/L (ref 135–145)
Total Bilirubin: 0.7 mg/dL (ref 0.2–1.2)
Total Protein: 7.2 g/dL (ref 6.0–8.3)

## 2022-02-14 LAB — CBC
HCT: 35.2 % — ABNORMAL LOW (ref 36.0–46.0)
Hemoglobin: 11.6 g/dL — ABNORMAL LOW (ref 12.0–15.0)
MCHC: 32.9 g/dL (ref 30.0–36.0)
MCV: 85.2 fl (ref 78.0–100.0)
Platelets: 259 10*3/uL (ref 150.0–400.0)
RBC: 4.14 Mil/uL (ref 3.87–5.11)
RDW: 15.8 % — ABNORMAL HIGH (ref 11.5–15.5)
WBC: 4.4 10*3/uL (ref 4.0–10.5)

## 2022-02-14 LAB — MAGNESIUM: Magnesium: 1.8 mg/dL (ref 1.5–2.5)

## 2022-02-14 LAB — PHOSPHORUS: Phosphorus: 3.5 mg/dL (ref 2.3–4.6)

## 2022-02-14 LAB — URIC ACID: Uric Acid, Serum: 6.9 mg/dL (ref 2.4–7.0)

## 2022-03-14 ENCOUNTER — Ambulatory Visit (INDEPENDENT_AMBULATORY_CARE_PROVIDER_SITE_OTHER): Payer: Medicaid Other

## 2022-03-14 ENCOUNTER — Encounter: Payer: Self-pay | Admitting: Cardiovascular Disease

## 2022-03-14 ENCOUNTER — Ambulatory Visit: Payer: Medicaid Other | Attending: Cardiovascular Disease | Admitting: Cardiovascular Disease

## 2022-03-14 VITALS — BP 170/90 | HR 88 | Ht 68.0 in | Wt 204.6 lb

## 2022-03-14 DIAGNOSIS — I701 Atherosclerosis of renal artery: Secondary | ICD-10-CM | POA: Diagnosis present

## 2022-03-14 DIAGNOSIS — I4892 Unspecified atrial flutter: Secondary | ICD-10-CM | POA: Diagnosis present

## 2022-03-14 DIAGNOSIS — E782 Mixed hyperlipidemia: Secondary | ICD-10-CM

## 2022-03-14 DIAGNOSIS — I739 Peripheral vascular disease, unspecified: Secondary | ICD-10-CM

## 2022-03-14 MED ORDER — APIXABAN 5 MG PO TABS: 5.0000 mg | ORAL_TABLET | Freq: Two times a day (BID) | ORAL | 3 refills | Status: DC

## 2022-03-14 NOTE — Patient Instructions (Signed)
Medication Instructions:  Stop taking aspirin Start taking Eliquis 5 mg twice a day *If you need a refill on your cardiac medications before your next appointment, please call your pharmacy*   Testing/Procedures: Your physician has requested that you have an echocardiogram. Echocardiography is a painless test that uses sound waves to create images of your heart. It provides your doctor with information about the size and shape of your heart and how well your heart's chambers and valves are working. This procedure takes approximately one hour. There are no restrictions for this procedure. Please do NOT wear cologne, perfume, aftershave, or lotions (deodorant is allowed). Please arrive 15 minutes prior to your appointment time.  This procedure will be done at [address] has recommended that you wear a 14  DAY ZIO-PATCH monitor. The Zio patch cardiac monitor continuously records heart rhythm data for up to 14 days, this is for patients being evaluated for multiple types heart rhythms. For the first 24 hours post application, please avoid getting the Zio monitor wet in the shower or by excessive sweating during exercise. After that, feel free to carry on with regular activities. Keep soaps and lotions away from the ZIO XT Patch.  This will be mailed to you, please expect 7-10 days to receive.    Applying the monitor   Shave hair from upper left chest.   Hold abrader disc by orange tab.  Rub abrader in 40 strokes over left upper chest as indicated in your monitor instructions.   Clean area with 4 enclosed alcohol pads .  Use all pads to assure are is cleaned thoroughly.  Let dry.   Apply patch as indicated in monitor instructions.  Patch will be place under collarbone on left side of chest with arrow pointing upward.   Rub patch adhesive wings for 2 minutes.Remove white label marked "1".  Remove white label marked "2".  Rub patch adhesive wings for 2 additional  minutes.   While looking in a mirror, press and release button in center of patch.  A small green light will flash 3-4 times .  This will be your only indicator the monitor has been turned on.     Do not shower for the first 24 hours.  You may shower after the first 24 hours.   Press button if you feel a symptom. You will hear a small click.  Record Date, Time and Symptom in the Patient Log Book.   When you are ready to remove patch, follow instructions on last 2 pages of Patient Log Book.  Stick patch monitor onto last page of Patient Log Book.   Place Patient Log Book in [place] box.  Use locking tab on box and tape box closed securely.  The [employer] has [employer] on it.  Please place in mailbox as soon as possible.  Your physician should have your test results approximately 7 days after the monitor has been mailed back to [facility].   Call [place] at [phone] if you have questions regarding your ZIO XT patch monitor.  Call them immediately if you see an orange light blinking on your monitor.   If your monitor falls off in less than 4 days contact our Monitor department at [phone].  If your monitor becomes loose or falls off after 4 days call [employer] at [phone] for suggestions on securing your monitor   Your physician has requested that you have an Aorta/Iliac  Duplex. This will be take place at [place], [address].  No food after 11PM the night before.  Water is OK. (Don't drink liquids if you have been instructed not to for ANOTHER test) Avoid foods that produce bowel gas, for 24 hours prior to exam (see below). No breakfast, no chewing gum, no smoking or carbonated beverages. Patient may take morning medications with water. Come in for test at least 15 minutes early to register.  Your physician has requested that you have a lower extremity arterial duplex. During this test, ultrasound is used to evaluate arterial blood  flow in the legs. Allow one hour for this exam. There are no restrictions or special instructions. This will take place at [place], [address].  Your physician has requested that you have an ankle brachial index (ABI). During this test an ultrasound and blood pressure cuff are used to evaluate the arteries that supply the arms and legs with blood. Allow thirty minutes for this exam. There are no restrictions or special instructions. This will take place at [place], [address].   Your physician has requested that you have a renal artery duplex. During this test, an ultrasound is used to evaluate blood flow to the kidneys. Take your medications as you usually do. This will take place at [place], [address].  No food after 11PM the night before.  Water is OK. (Don't drink liquids if you have been instructed not to for ANOTHER test). Avoid foods that produce bowel gas, for 24 hours prior to exam (see below). No breakfast, no chewing gum, no smoking or carbonated beverages. Patient may take morning medications with water. Come in for test at least 15 minutes early to register.  Your physician has requested that you have a carotid duplex. This test is an ultrasound of the carotid arteries in your neck. It looks at blood flow through these arteries that supply the brain with blood. Allow one hour for this exam. There are no restrictions or special instructions. This will take place at [place], [address].     Follow-Up: At [facility], you and your health needs are our priority.  As part of our continuing mission to provide you with exceptional heart care, we have created designated Provider Care Teams.  These Care Teams include your primary Cardiologist (physician) and Advanced Practice Providers (APPs -  Physician Assistants and Nurse Practitioners) who all work together to provide you with the care you need, when you need it.  We recommend signing up  for the patient portal called "MyChart".  Sign up information is provided on this After Visit Summary.  MyChart is used to connect with patients for Virtual Visits (Telemedicine).  Patients are able to view lab/test results, encounter notes, upcoming appointments, etc.  Non-urgent messages can be sent to your provider as well.   To learn more about what you can do with MyChart, go to [email or web address].    Your next appointment:   3 month(s)  Provider:   Quay Burow, MD

## 2022-03-14 NOTE — Progress Notes (Unsigned)
Enrolled patient for a 14 day Zio XT  monitor to be mailed to patients home  °

## 2022-03-14 NOTE — Assessment & Plan Note (Signed)
History of peripheral vascular disease status post remote right carotid endarterectomy, right renal artery stenting (by Dr. Rollene Fare), bilateral iliac stenting, right femoropopliteal bypass grafting.  I am going to get carotid, renal and lower extremity arterial Doppler studies.  She currently denies claudication.

## 2022-03-14 NOTE — Assessment & Plan Note (Signed)
Patient is in atrial flutter today with ventricular sponsor of 88. This patients CHA2DS2-VASc Score and unadjusted Ischemic Stroke Rate (% per year) is equal to 4.8 % stroke rate/year from a score of 4.  I am going to begin her on Eliquis 5 mg every other day.  She is already on DAPT with aspirin and clopidogrel.  I am going to drop her aspirin.  Above score calculated as 1 point each if present [CHF, HTN, DM, Vascular=MI/PAD/Aortic Plaque, Age if 65-74, or Female] Above score calculated as 2 points each if present [Age > 75, or Stroke/TIA/TE]

## 2022-03-14 NOTE — Assessment & Plan Note (Signed)
History of CAD send artery bypass grafting x 3 by Dr. Roxan Hockey in 2006.  She had a LIMA to LAD, RIMA to the RCA and a vein to an OM branch.  She had a STEMI 09/27/2012.  I catheterized her revealing occluded vein graft to the OM branch and stented it.  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  She has had no coronary intervention since that time.  She denies chest pain or shortness of breath.

## 2022-03-14 NOTE — Assessment & Plan Note (Signed)
Long history of tobacco use having quit in December 2022.

## 2022-03-14 NOTE — Assessment & Plan Note (Signed)
History of hyperlipidemia on high-dose atorvastatin with lipid profile performed 02/14/2022 revealing total cholesterol 130, LDL 63 and HDL 45.

## 2022-03-14 NOTE — Progress Notes (Signed)
03/14/2022 [name]   August 25, 1950  [id number]  Primary Physician Loralee Pacas, MD Primary Cardiologist: Lorretta Harp MD [name], [place]  HPI:  [name] is a 72 y.o. 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  She is referred back by her PCP, Dr. Randol Kern, to be reestablished because of known cardiovascular disease.  Risk factors include long history tobacco abuse having quit December 2022.  She has a history of treated hypertension and hyperlipidemia.  She is not diabetic.  She has a strong family history of heart disease with both parents who had myocardial infarction's as well as a sister who died of a myocardial infarction.  She had coronary bypass grafting x 3 by Dr. Roxan Hockey in 2000 and with a LIMA to the LAD, RIMA to the RCA and a vein to an OM branch.  She has had right renal artery stenting by Dr. Rollene Fare, right carotid endarterectomy by Dr. Donnetta Hutching, bilateral iliac stenting and right femoropopliteal bypass grafting as well.  She walks on a daily basis.  She is otherwise asymptomatic.   Current Meds  Medication Sig   apixaban (ELIQUIS) 5 MG TABS tablet Take 1 tablet (5 mg total) by mouth 2 (two) times daily.   atorvastatin (LIPITOR) 80 MG tablet TAKE 1 TABLET(80 MG) BY MOUTH DAILY AT 6 PM   busPIRone (BUSPAR) 10 MG tablet TAKE 1 TABLET(10 MG) BY MOUTH THREE TIMES DAILY   carvedilol (COREG) 12.5 MG tablet TAKE 1 TABLET(12.5 MG) BY MOUTH TWICE DAILY WITH A MEAL   clopidogrel (PLAVIX) 75 MG tablet Take 1 tablet (75 mg total) by mouth daily.   Esomeprazole Magnesium (NEXIUM PO) Nexium   isosorbide mononitrate (IMDUR) 30 MG 24 hr tablet Take 1 tablet (30 mg total) by mouth daily. Replaces clonidine   losartan (COZAAR) 25 MG tablet TAKE 1 TABLET(25 MG) BY MOUTH TWICE DAILY   nitroGLYCERIN (NITROSTAT) 0.4 MG SL tablet Place 1 tablet (0.4 mg total) under the tongue every 5 (five) minutes x 3 doses as needed  for chest pain.   [DISCONTINUED] aspirin 81 MG tablet Take 1 tablet (81 mg total) by mouth daily. Reported on 06/07/2015     Allergies  Allergen Reactions   Ace Inhibitors Cough   Codeine Itching    welts   Morphine Itching    Welts     Social History   Socioeconomic History   Marital status: Single    Spouse name: Not on file   Number of children: Not on file   Years of education: Not on file   Highest education level: Not on file  Occupational History   Occupation: Merchant navy officer at [employer]    Employer: [employer]  Tobacco Use   Smoking status: Former    Packs/day: 0.50    Types: Cigarettes   Smokeless tobacco: Former    Quit date: 04/17/2013   Tobacco comments:    1/2 pack per day since 2005, 40 pack years  Vaping Use   Vaping Use: Never used  Substance and Sexual Activity   Alcohol use: No   Drug use: No   Sexual activity: Yes  Other Topics Concern   Not on file  Social History Narrative   Divorced, no children   Social Determinants of Health   Financial Resource Strain: Not on file  Food Insecurity: Not on file  Transportation Needs: Not on file  Physical Activity: Not on file  Stress: Not on file  Social Connections: Not on file  Intimate Partner Violence: Not on file     Review of Systems: General: negative for chills, fever, night sweats or weight changes.  Cardiovascular: negative for chest pain, dyspnea on exertion, edema, orthopnea, palpitations, paroxysmal nocturnal dyspnea or shortness of breath Dermatological: negative for rash Respiratory: negative for cough or wheezing Urologic: negative for hematuria Abdominal: negative for nausea, vomiting, diarrhea, bright red blood per rectum, melena, or hematemesis Neurologic: negative for visual changes, syncope, or dizziness All other systems reviewed and are otherwise negative except as noted above.    Blood pressure (!) 170/90, pulse 88, height 5' 8"$  (1.727 m), weight  204 lb 9.6 oz (92.8 kg), SpO2 98 %.  General appearance: alert and no distress Neck: no adenopathy, no carotid bruit, no JVD, supple, symmetrical, trachea midline, and thyroid not enlarged, symmetric, no tenderness/mass/nodules Lungs: clear to auscultation bilaterally Heart: regular rate and rhythm, S1, S2 normal, no murmur, click, rub or gallop Extremities: extremities normal, atraumatic, no cyanosis or edema Pulses: 2+ and symmetric Skin: Skin color, texture, turgor normal. No rashes or lesions Neurologic: Grossly normal  EKG atrial flutter with a variable ventricular response of 88 and occasional aberrantly conducted beats.  I personally reviewed this EKG.  ASSESSMENT AND PLAN:   Hyperlipidemia History of hyperlipidemia on high-dose atorvastatin with lipid profile performed 02/14/2022 revealing total cholesterol 130, LDL 63 and HDL 45.  Peripheral vascular disease of lower extremity (HCC) History of peripheral vascular disease status post remote right carotid endarterectomy, right renal artery stenting (by Dr. Rollene Fare), bilateral iliac stenting, right femoropopliteal bypass grafting.  I am going to get carotid, renal and lower extremity arterial Doppler studies.  She currently denies claudication.  Coronary atherosclerosis of autologous vein bypass graft History of CAD send artery bypass grafting x 3 by Dr. Roxan Hockey in 2006.  She had a LIMA to LAD, RIMA to the RCA and a vein to an OM branch.  She had a STEMI 09/27/2012.  I catheterized her revealing occluded vein graft to the OM branch and stented it.  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  She has had no coronary intervention since that time.  She denies chest pain or shortness of breath.  History of smoking 10-25 pack years Long history of tobacco use having quit in December 2022.  Atrial flutter ([place]) Patient is in atrial flutter today with ventricular sponsor of 88.  This patients CHA2DS2-VASc Score and unadjusted Ischemic Stroke Rate (% per year) is equal to 4.8 % stroke rate/year from a score of 4.  I am going to begin her on Eliquis 5 mg every other day.  She is already on DAPT with aspirin and clopidogrel.  I am going to drop her aspirin.  Above score calculated as 1 point each if present [CHF, HTN, DM, Vascular=MI/PAD/Aortic Plaque, Age if 65-74, or Female] Above score calculated as 2 points each if present [Age > 75, or Stroke/TIA/TE]      Lorretta Harp MD [facility], [facility] 03/14/2022 11:29 AM

## 2022-03-27 ENCOUNTER — Ambulatory Visit (HOSPITAL_COMMUNITY)
Admission: RE | Admit: 2022-03-27 | Discharge: 2022-03-27 | Disposition: A | Discharge status: Home / Self Care | Payer: Medicaid Other | Source: Ambulatory Visit | Attending: Cardiology | Admitting: Cardiology

## 2022-03-27 ENCOUNTER — Ambulatory Visit (HOSPITAL_BASED_OUTPATIENT_CLINIC_OR_DEPARTMENT_OTHER)
Admission: RE | Admit: 2022-03-27 | Discharge: 2022-03-27 | Disposition: A | Discharge status: Home / Self Care | Payer: Medicaid Other | Source: Ambulatory Visit | Attending: Cardiovascular Disease | Admitting: Cardiovascular Disease

## 2022-03-27 DIAGNOSIS — I739 Peripheral vascular disease, unspecified: Secondary | ICD-10-CM | POA: Diagnosis not present

## 2022-03-27 DIAGNOSIS — E782 Mixed hyperlipidemia: Secondary | ICD-10-CM | POA: Diagnosis not present

## 2022-03-27 DIAGNOSIS — Z95828 Presence of other vascular implants and grafts: Secondary | ICD-10-CM | POA: Diagnosis not present

## 2022-03-28 ENCOUNTER — Ambulatory Visit (HOSPITAL_COMMUNITY)
Admission: RE | Admit: 2022-03-28 | Discharge: 2022-03-28 | Disposition: A | Discharge status: Home / Self Care | Payer: Medicaid Other | Source: Ambulatory Visit | Attending: Cardiovascular Disease | Admitting: Cardiovascular Disease

## 2022-03-28 ENCOUNTER — Ambulatory Visit (HOSPITAL_BASED_OUTPATIENT_CLINIC_OR_DEPARTMENT_OTHER)
Admission: RE | Admit: 2022-03-28 | Discharge: 2022-03-28 | Disposition: A | Discharge status: Home / Self Care | Payer: Medicaid Other | Source: Ambulatory Visit | Attending: Cardiovascular Disease | Admitting: Cardiovascular Disease

## 2022-03-28 DIAGNOSIS — I701 Atherosclerosis of renal artery: Secondary | ICD-10-CM | POA: Insufficient documentation

## 2022-03-28 DIAGNOSIS — E782 Mixed hyperlipidemia: Secondary | ICD-10-CM

## 2022-03-28 DIAGNOSIS — I4892 Unspecified atrial flutter: Secondary | ICD-10-CM

## 2022-03-29 LAB — VAS US ABI WITH/WO TBI
Left ABI: 0.93
Right ABI: 0.7

## 2022-03-30 ENCOUNTER — Encounter: Payer: Self-pay | Admitting: Internal Medicine

## 2022-03-30 DIAGNOSIS — I6529 Occlusion and stenosis of unspecified carotid artery: Secondary | ICD-10-CM | POA: Insufficient documentation

## 2022-03-31 ENCOUNTER — Encounter (HOSPITAL_COMMUNITY): Payer: Medicaid Other

## 2022-04-01 ENCOUNTER — Telehealth: Payer: Self-pay | Admitting: Cardiovascular Disease

## 2022-04-01 NOTE — Telephone Encounter (Signed)
Follow  Up:      Patient is returning call, concerning her test  results.

## 2022-04-01 NOTE — Telephone Encounter (Signed)
Patient is scheduled on 4/16 for office visit Per: Dr. Gwenlyn Found. The patient has been notified of the result and verbalized understanding.  All questions (if any) were answered. Gershon Crane, LPN [id number]

## 2022-04-04 NOTE — Telephone Encounter (Signed)
Spoke with pt regarding her recent vascular studies. Per Dr. Gwenlyn Found would like to see pt sooner than later regarding these findings. Pt is scheduled to have echocardiogram on 3/12. Appointment scheduled for the week after to see pt back in clinic. Pt verbalizes understanding.

## 2022-04-07 ENCOUNTER — Encounter: Payer: Self-pay | Admitting: Internal Medicine

## 2022-04-15 ENCOUNTER — Ambulatory Visit (HOSPITAL_COMMUNITY): Payer: Medicaid Other | Attending: Cardiovascular Disease

## 2022-04-15 DIAGNOSIS — I4892 Unspecified atrial flutter: Secondary | ICD-10-CM | POA: Diagnosis not present

## 2022-04-15 DIAGNOSIS — E782 Mixed hyperlipidemia: Secondary | ICD-10-CM | POA: Diagnosis not present

## 2022-04-15 LAB — ECHOCARDIOGRAM COMPLETE
Area-P 1/2: 4.68 cm2
S' Lateral: 4.1 cm

## 2022-04-17 ENCOUNTER — Encounter: Payer: Self-pay | Admitting: Internal Medicine

## 2022-04-17 DIAGNOSIS — I517 Cardiomegaly: Secondary | ICD-10-CM | POA: Insufficient documentation

## 2022-04-18 ENCOUNTER — Telehealth: Payer: Self-pay | Admitting: Cardiovascular Disease

## 2022-04-18 NOTE — Telephone Encounter (Signed)
Misty Harp, MD  You; Sharee Holster, RN3 hours ago (1:16 PM)    Long runs of slow VT.  Needs return office visit to discuss   Pt is scheduled to see Dr. Gwenlyn Found on Tuesday, 3/19 to discuss.

## 2022-04-18 NOTE — Telephone Encounter (Signed)
[name] with [employer] called to report  V-tach Hr 126, lasting 30 seconds; see page 13 strip number 6- and it is in [employer].          Cardiac Monitor Alert  Date of alert:  04/18/2022   Patient Name: [name]  DOB: 1950-08-24  MRN: [id number]   [place] Cardiologist: Dr Gwenlyn Found   Monitor Information: Long Term Monitor [ZioXT] 2/23-04/11/22 Reason:  V-tach Hr 126, lasting 30 seconds; see page 13 strip number 6- and it is in [employer].  Ordering provider:  Dr Gwenlyn Found { Alert Ventricular Tachycardia  on 04/08/22   Sharee Holster, RN  04/18/2022 12:58 PM

## 2022-04-18 NOTE — Telephone Encounter (Signed)
Abnormal ziopatch results

## 2022-04-22 ENCOUNTER — Ambulatory Visit: Payer: Medicaid Other | Attending: Cardiovascular Disease | Admitting: Cardiovascular Disease

## 2022-04-22 ENCOUNTER — Encounter: Payer: Self-pay | Admitting: Cardiovascular Disease

## 2022-04-22 VITALS — BP 196/104 | HR 100 | Ht 68.0 in | Wt 202.6 lb

## 2022-04-22 DIAGNOSIS — I739 Peripheral vascular disease, unspecified: Secondary | ICD-10-CM | POA: Diagnosis present

## 2022-04-22 DIAGNOSIS — E782 Mixed hyperlipidemia: Secondary | ICD-10-CM

## 2022-04-22 DIAGNOSIS — I479 Paroxysmal tachycardia, unspecified: Secondary | ICD-10-CM | POA: Diagnosis not present

## 2022-04-22 DIAGNOSIS — I10 Essential (primary) hypertension: Secondary | ICD-10-CM | POA: Diagnosis present

## 2022-04-22 DIAGNOSIS — I701 Atherosclerosis of renal artery: Secondary | ICD-10-CM | POA: Diagnosis present

## 2022-04-22 NOTE — Progress Notes (Signed)
[name] returns today for follow-up of her noninvasive tests.  1: 2D echo performed 04/15/2022 revealed mild LV dysfunction with an EF of 45 to 50% and grade 2 diastolic dysfunction.  2: Abdominal aortic Dopplers revealed moderate disease in her right external iliac artery although she denies claudication  3: Lower extremity Dopplers revealed a right ABI of 0.7 and a left of 0.93 with an occluded right femoropopliteal bypass graft.  4: Renal Dopplers revealed a patent right renal artery stent with high-grade left renal artery stenosis.  Her renal dimensions are preserved.  Her blood pressures at home are in the 130/70 range on 2 antihypertensive medications.  At this point, there is no indication for left renal artery intervention.  Will continue to follow noninvasively.  5: Zio patch-atrial for with a variable ventricular response (tacky/bradycardia).  Frequent PVCs versus aberrantly conducted beats with a burden of 1.1%.  Long runs of slow VT.  Will refer to EP for further evaluation.  6: Carotid Doppler studies-occluded right internal carotid artery endarterectomy site, patent left carotid artery.  I will plan on seeing [name] back in 6 months for follow-up.  Will continue to follow her Doppler studies of her carotids, renals, and aortoiliac.   Lorretta Harp, M.D., [place], [facility], [place] [address] [place], [place]  [address]  [phone] 04/22/2022 3:12 PM

## 2022-04-22 NOTE — Patient Instructions (Signed)
Medication Instructions:  Your physician recommends that you continue on your current medications as directed. Please refer to the Current Medication list given to you today.  *If you need a refill on your cardiac medications before your next appointment, please call your pharmacy*   Testing/Procedures: Your physician has requested that you have an Aorta/Iliac Duplex. This will be take place at [place], [address].  No food after 11PM the night before.  Water is OK. (Don't drink liquids if you have been instructed not to for ANOTHER test) Avoid foods that produce bowel gas, for 24 hours prior to exam (see below). No breakfast, no chewing gum, no smoking or carbonated beverages. Patient may take morning medications with water. Come in for test at least 15 minutes early to register. To do in February 2025.   Your physician has requested that you have a lower extremity arterial duplex. During this test, ultrasound is used to evaluate arterial blood flow in the legs. Allow one hour for this exam. There are no restrictions or special instructions. This will take place at [place], [address]. To do in February 2025.  Your physician has requested that you have an ankle brachial index (ABI). During this test an ultrasound and blood pressure cuff are used to evaluate the arteries that supply the arms and legs with blood. Allow thirty minutes for this exam. There are no restrictions or special instructions. This will take place at [place], [address]. To do in February 2025.  Your physician has requested that you have a renal artery duplex. During this test, an ultrasound is used to evaluate blood flow to the kidneys. Take your medications as you usually do. This will take place at [place], [address].  No food after 11PM the night before.  Water is OK. (Don't drink liquids if you have been instructed not to for ANOTHER test). Avoid foods that produce bowel gas,  for 24 hours prior to exam (see below). No breakfast, no chewing gum, no smoking or carbonated beverages. Patient may take morning medications with water. Come in for test at least 15 minutes early to register. To do in February 2025.  Your physician has requested that you have a carotid duplex. This test is an ultrasound of the carotid arteries in your neck. It looks at blood flow through these arteries that supply the brain with blood. Allow one hour for this exam. There are no restrictions or special instructions. This will take place at [place], [address]. To do in February 2025.     Follow-Up: At [facility], you and your health needs are our priority.  As part of our continuing mission to provide you with exceptional heart care, we have created designated Provider Care Teams.  These Care Teams include your primary Cardiologist (physician) and Advanced Practice Providers (APPs -  Physician Assistants and Nurse Practitioners) who all work together to provide you with the care you need, when you need it.  We recommend signing up for the patient portal called "MyChart".  Sign up information is provided on this After Visit Summary.  MyChart is used to connect with patients for Virtual Visits (Telemedicine).  Patients are able to view lab/test results, encounter notes, upcoming appointments, etc.  Non-urgent messages can be sent to your provider as well.   To learn more about what you can do with MyChart, go to [email or web address].    Your next appointment:   6 month(s)  Provider:   Quay Burow, MD   Other Instructions Dr. Gwenlyn Found has requested that you schedule an appointment with one of our clinical pharmacists for a blood pressure check appointment within the next 4 weeks.  If you monitor your blood pressure (BP) at home, please bring your BP cuff and your BP readings with you to this appointment  HOW TO TAKE YOUR BLOOD PRESSURE: Rest 5 minutes before taking  your blood pressure. Don't smoke or drink caffeinated beverages for at least 30 minutes before. Take your blood pressure before (not after) you eat. Sit comfortably with your back supported and both feet on the floor (don't cross your legs). Elevate your arm to heart level on a table or a desk. Use the proper sized cuff. It should fit smoothly and snugly around your bare upper arm. There should be enough room to slip a fingertip under the cuff. The bottom edge of the cuff should be 1 inch above the crease of the elbow. Ideally, take 3 measurements at one sitting and record the average.

## 2022-04-30 ENCOUNTER — Other Ambulatory Visit: Payer: Self-pay | Admitting: Nurse Practitioner

## 2022-04-30 DIAGNOSIS — I2510 Atherosclerotic heart disease of native coronary artery without angina pectoris: Secondary | ICD-10-CM

## 2022-05-20 ENCOUNTER — Ambulatory Visit: Payer: Medicaid Other | Admitting: Cardiovascular Disease

## 2022-05-22 ENCOUNTER — Ambulatory Visit: Payer: Medicaid Other

## 2022-06-17 ENCOUNTER — Ambulatory Visit: Payer: Medicaid Other | Admitting: Cardiovascular Disease

## 2022-07-18 ENCOUNTER — Ambulatory Visit: Payer: Medicaid Other | Admitting: Internal Medicine

## 2022-07-25 ENCOUNTER — Encounter: Payer: Self-pay | Admitting: Cardiovascular Disease

## 2022-08-01 ENCOUNTER — Ambulatory Visit: Payer: Medicaid Other | Admitting: Internal Medicine

## 2022-08-15 ENCOUNTER — Ambulatory Visit: Payer: Medicaid Other | Admitting: Internal Medicine

## 2022-09-02 ENCOUNTER — Ambulatory Visit: Payer: Medicaid Other | Admitting: Internal Medicine

## 2022-09-03 ENCOUNTER — Encounter (INDEPENDENT_AMBULATORY_CARE_PROVIDER_SITE_OTHER): Payer: Self-pay

## 2022-09-26 ENCOUNTER — Encounter: Payer: Self-pay | Admitting: Internal Medicine

## 2022-09-26 ENCOUNTER — Ambulatory Visit (INDEPENDENT_AMBULATORY_CARE_PROVIDER_SITE_OTHER): Payer: Medicaid Other | Admitting: Internal Medicine

## 2022-09-26 ENCOUNTER — Other Ambulatory Visit (HOSPITAL_COMMUNITY): Payer: Self-pay

## 2022-09-26 VITALS — BP 111/68 | HR 97 | Temp 98.2°F | Ht 68.0 in | Wt 202.2 lb

## 2022-09-26 DIAGNOSIS — I2510 Atherosclerotic heart disease of native coronary artery without angina pectoris: Secondary | ICD-10-CM

## 2022-09-26 DIAGNOSIS — I5032 Chronic diastolic (congestive) heart failure: Secondary | ICD-10-CM | POA: Diagnosis not present

## 2022-09-26 DIAGNOSIS — F419 Anxiety disorder, unspecified: Secondary | ICD-10-CM | POA: Diagnosis not present

## 2022-09-26 DIAGNOSIS — N1831 Chronic kidney disease, stage 3a: Secondary | ICD-10-CM

## 2022-09-26 DIAGNOSIS — Z599 Problem related to housing and economic circumstances, unspecified: Secondary | ICD-10-CM

## 2022-09-26 DIAGNOSIS — Z955 Presence of coronary angioplasty implant and graft: Secondary | ICD-10-CM

## 2022-09-26 DIAGNOSIS — I6521 Occlusion and stenosis of right carotid artery: Secondary | ICD-10-CM

## 2022-09-26 DIAGNOSIS — Z1211 Encounter for screening for malignant neoplasm of colon: Secondary | ICD-10-CM

## 2022-09-26 DIAGNOSIS — I10 Essential (primary) hypertension: Secondary | ICD-10-CM

## 2022-09-26 DIAGNOSIS — Z760 Encounter for issue of repeat prescription: Secondary | ICD-10-CM

## 2022-09-26 DIAGNOSIS — K219 Gastro-esophageal reflux disease without esophagitis: Secondary | ICD-10-CM

## 2022-09-26 DIAGNOSIS — J418 Mixed simple and mucopurulent chronic bronchitis: Secondary | ICD-10-CM

## 2022-09-26 DIAGNOSIS — E559 Vitamin D deficiency, unspecified: Secondary | ICD-10-CM

## 2022-09-26 MED ORDER — ATORVASTATIN CALCIUM 80 MG PO TABS
ORAL_TABLET | ORAL | 3 refills | Status: DC
Start: 2022-09-26 — End: 2023-10-27
  Filled 2022-09-26 – 2023-01-23 (×3): qty 90, 90d supply, fill #0
  Filled 2023-04-28: qty 90, 90d supply, fill #1
  Filled 2023-07-28: qty 90, 90d supply, fill #2

## 2022-09-26 MED ORDER — ESOMEPRAZOLE MAGNESIUM 40 MG PO CPDR
40.0000 mg | DELAYED_RELEASE_CAPSULE | Freq: Every day | ORAL | 3 refills | Status: DC
Start: 2022-09-26 — End: 2023-02-25
  Filled 2022-09-26 – 2023-01-23 (×3): qty 90, 90d supply, fill #0

## 2022-09-26 MED ORDER — BUSPIRONE HCL 10 MG PO TABS
ORAL_TABLET | ORAL | 3 refills | Status: DC
  Filled 2022-09-26: qty 180, 60d supply, fill #0
  Filled 2022-10-03: qty 180, 90d supply, fill #0
  Filled 2023-01-23: qty 180, 90d supply, fill #1

## 2022-09-26 MED ORDER — CARVEDILOL 12.5 MG PO TABS
ORAL_TABLET | ORAL | 3 refills | Status: DC
  Filled 2022-09-26 – 2023-03-10 (×3): qty 180, 90d supply, fill #0
  Filled 2023-07-28: qty 180, 90d supply, fill #1

## 2022-09-26 MED ORDER — APIXABAN 5 MG PO TABS
5.0000 mg | ORAL_TABLET | Freq: Two times a day (BID) | ORAL | 3 refills | Status: DC
Start: 2022-09-26 — End: 2022-09-26
  Filled 2022-09-26: qty 90, 45d supply, fill #0

## 2022-09-26 MED ORDER — CLOPIDOGREL BISULFATE 75 MG PO TABS
75.0000 mg | ORAL_TABLET | Freq: Every day | ORAL | 3 refills | Status: DC
Start: 2022-09-26 — End: 2023-01-23
  Filled 2022-09-26: qty 90, 90d supply, fill #0

## 2022-09-26 MED ORDER — ISOSORBIDE MONONITRATE ER 30 MG PO TB24
30.0000 mg | ORAL_TABLET | Freq: Every day | ORAL | 3 refills | Status: DC
Start: 2022-09-26 — End: 2023-01-23
  Filled 2022-09-26: qty 90, 90d supply, fill #0

## 2022-09-26 MED ORDER — NITROGLYCERIN 0.4 MG SL SUBL
0.4000 mg | SUBLINGUAL_TABLET | SUBLINGUAL | 3 refills | Status: AC | PRN
  Filled 2022-09-26: qty 25, 7d supply, fill #0
  Filled 2022-10-03: qty 25, 5d supply, fill #0
  Filled 2023-01-23: qty 25, 5d supply, fill #1

## 2022-09-26 MED ORDER — LOSARTAN POTASSIUM 25 MG PO TABS
ORAL_TABLET | ORAL | 3 refills | Status: DC
Start: 2022-09-26 — End: 2023-01-23
  Filled 2022-09-26: qty 180, 90d supply, fill #0

## 2022-09-26 NOTE — Progress Notes (Unsigned)
[facility]: [phone]   -- Routine Medical Office Visit --  Patient:  [name]      Age: 72 y.o.       Sex:  female  Date:   09/26/2022 Patient Care Team: Lula Olszewski, MD as PCP - General (Internal Medicine) [name], MD as Consulting Physician (Cardiology) Today's Healthcare Provider: Lula Olszewski, MD   Assessment & Plan Anxiety She reports depression related to their living situation and have discussed the potential use of antidepressants. However, she prefers to manage without medication adjustment at this time. We will monitor her mental health status without making medication changes. Patient will continue with:   busPIRone (BUSPAR) 10 MG tablet, TAKE 1 TABLET(10 MG) BY MOUTH THREE TIMES DAILY  Colon cancer screening We discussed the sensitivity of FOBT versus Cologuard for annual colon cancer screening. She prefers Cologuard, so we will order the Cologuard test. Housing or economic circumstance She is experiencing stress in their current living situation with multiple animals and are seeking affordable assisted living while on [place]. We will refer her to social work for assistance with long-term care planning, housing, and financial strain. Coronary artery disease involving native coronary artery of native heart without angina pectoris She has stopped taking Eliquis due to side effects and have resumed taking 81mg  aspirin daily. We will continue the 81mg  aspirin daily and monitor for any signs of clotting or bleeding. Chronic diastolic congestive heart failure (HCC) Euvolemic, patient will continue with     atorvastatin (LIPITOR) 80 MG tablet, TAKE 1 TABLET(80 MG) BY MOUTH DAILY AT 6 PM   carvedilol (COREG) 12.5 MG tablet, TAKE 1 TABLET(12.5 MG) BY MOUTH TWICE DAILY WITH A MEAL   isosorbide mononitrate (IMDUR) 30 MG 24 hr tablet, Take 1 tablet (30 mg total) by mouth daily. Replaces clonidine   losartan (COZAAR) 25 MG tablet, TAKE  1 TABLET(25 MG) BY MOUTH TWICE DAILY   nitroGLYCERIN (NITROSTAT) 0.4 MG SL tablet, Place 1 tablet (0.4 mg total) under the tongue every 5 (five) minutes x 3 doses as needed for chest pain.   clopidogrel (PLAVIX) 75 MG tablet, Take 1 tablet (75 mg total) by mouth daily. She is not follow up with cardiologist as recommended right now, will focus on helping improve stress/anxiety Hypertension, unspecified type Controlled, continue(s) with As of 09/27/2022 Current hypertension medications:       Sig   carvedilol (COREG) 12.5 MG tablet TAKE 1 TABLET(12.5 MG) BY MOUTH TWICE DAILY WITH A MEAL   losartan (COZAAR) 25 MG tablet TAKE 1 TABLET(25 MG) BY MOUTH TWICE DAILY      Stented coronary artery Patient will continue with   atorvastatin (LIPITOR) 80 MG tablet, TAKE 1 TABLET(80 MG) BY MOUTH DAILY AT 6 PM   carvedilol (COREG) 12.5 MG tablet, TAKE 1 TABLET(12.5 MG) BY MOUTH TWICE DAILY WITH A MEAL   isosorbide mononitrate (IMDUR) 30 MG 24 hr tablet, Take 1 tablet (30 mg total) by mouth daily. Replaces clonidine   losartan (COZAAR) 25 MG tablet, TAKE 1 TABLET(25 MG) BY MOUTH TWICE DAILY   nitroGLYCERIN (NITROSTAT) 0.4 MG SL tablet, Place 1 tablet (0.4 mg total) under the tongue every 5 (five) minutes x 3 doses as needed for chest pain.   clopidogrel (PLAVIX) 75 MG tablet, Take 1 tablet (75 mg total) by mouth daily. Gastroesophageal reflux disease without esophagitis She reports severe GERD managed with Nexium, despite its interaction with Plavix. She prefers to continue Nexium due to its effectiveness.  We will continue Nexium and monitor for any signs of bleeding due to the interaction with Plavix.   esomeprazole (NEXIUM) 40 MG capsule, Take 1 capsule (40 mg total) by mouth daily at 12 noon. Mixed simple and mucopurulent chronic bronchitis (HCC)  Stage 3a chronic kidney disease (HCC) Mild kidney disease has been noted. We advised her to stay hydrated to protect her kidneys and will continue to monitor  kidney function. Medication refill She is currently taking Buspirone 10mg , Carvedilol 12.5mg , Lipitor 80mg , Nitros, Imdur 30mg , Clopidogrel 75mg , Nexium, and Losartan 25mg  twice daily. We will renew all current medications and send them to her preferred pharmacy. Stenosis of right carotid artery She has stopped taking Eliquis due to side effects and have resumed taking 81mg  aspirin daily. We will continue the 81mg  aspirin daily and monitor for any signs of clotting or bleeding.  She also takes Plavix. No signs & symptoms new CVA at this time. I sent eliquis accidentlaly to pharmacy and cancelled later.  General Health Maintenance / Followup Plans She declined a blood draw today and plan to complete it at the next visit. We will continue to monitor bronchitis symptoms, which require no treatment at this time, and follow up after the social work consultation.      Today's encounter orders          Ordered    Cologuard        09/26/22 1223    AMB Referral to Managed Medicaid Care Management        09/26/22 1223    apixaban (ELIQUIS) 5 MG TABS tablet  2 times daily,   Status:  Discontinued        09/26/22 1223    atorvastatin (LIPITOR) 80 MG tablet        09/26/22 1223    busPIRone (BUSPAR) 10 MG tablet        09/26/22 1223    carvedilol (COREG) 12.5 MG tablet       Note to Pharmacy: Must have office visit for refills   09/26/22 1223    clopidogrel (PLAVIX) 75 MG tablet  Daily        09/26/22 1223    isosorbide mononitrate (IMDUR) 30 MG 24 hr tablet  Daily        09/26/22 1223    losartan (COZAAR) 25 MG tablet       Note to Pharmacy: Must keep upcoming office visit for refills   09/26/22 1223    nitroGLYCERIN (NITROSTAT) 0.4 MG SL tablet  Every 5 min x3 PRN        09/26/22 1223    esomeprazole (NEXIUM) 40 MG capsule  Daily        09/26/22 1223           Future Appointments  Date Time Provider Department Center  10/07/2022 11:30 AM [name], MD [facility] None          Subjective   72 y.o. female who has Hyperlipidemia; History of inferior wall myocardial infarction; Cerebrovascular disease; Peripheral vascular disease of lower extremity (HCC); GASTROESOPHAGEAL REFLUX DISEASE; DIVERTICULAR DISEASE; Hypertensive disorder; Coronary atherosclerosis of autologous vein bypass graft; Chronic diastolic congestive heart failure (HCC); COPD (chronic obstructive pulmonary disease) (HCC); Obesity (BMI 30.0-34.9); CKD (chronic kidney disease) stage 3, GFR 30-59 ml/min (HCC); Insomnia; Stented coronary artery; History of smoking 10-25 pack years; Anxiety; Atrial flutter (HCC); Carotid artery stenosis; and Left atrial dilatation on their problem list. Her reasons/main concerns/chief complaints for today's office visit are Requesting assistance  with assisted living, 8 month follow-up, and Medication change (Stopped taking Eliquis due to the side effects of dizziness. Took for two months.)   ------------------------------------------------------------------------------------------------------------------------ AI-Extracted: Discussed the use of AI scribe software for clinical note transcription with the patient, who gave verbal consent to proceed.  History of Present Illness   The patient, with a history of cardiovascular disease and depression, presents with concerns about her current living situation and its impact on her health. She reports feeling stressed and anxious due to her living conditions, which involve caring for numerous animals in a home that is not hers. This has led to an increase in her depressive symptoms, which she attributes to her current circumstances.  The patient has been experiencing issues with her blood pressure, which she monitors at home. However, she has expressed concerns about the accuracy of her home monitor. She is currently on multiple medications including Buspirone, Carvedilol, Atorvastatin, Nitroglycerin, Isosorbide, Clopidogrel, Nexium, and  Losartan. She has stopped taking Eliquis due to side effects including lethargy and dizziness.  The patient also reports a history of severe gastroesophageal reflux disease (GERD), for which she takes Nexium. She has been advised of a potential interaction between Nexium and Clopidogrel but has chosen to continue with Nexium due to its effectiveness in managing her GERD symptoms.  In addition to her physical health concerns, the patient is dealing with financial strain and housing insecurity. She is currently living with a family member and is seeking assistance to find a more suitable living arrangement. She expresses a strong desire to avoid a nursing home setting and is interested in exploring other options that would be covered by her Medicaid insurance.  The patient has a history of two heart attacks within a seven-month period, which led to her retirement. She reports feeling frustrated with her current situation and expresses a desire for change. She has been referred to social work for assistance with long-term care planning and stress management.  ------------------------------------------------------------------------------------------------------------------------ She has a past medical history of ASCVD (arteriosclerotic cardiovascular disease), Cerebrovascular disease, Coronary artery disease, Diverticulitis, DJD (degenerative joint disease) of cervical spine, GERD (gastroesophageal reflux disease), H. pylori infection, Hepatitis, Hypertension, Migraine headache, PVD (peripheral vascular disease) (HCC), Shingles (11/21/2015), Stented coronary artery (01/31/2022), and Tobacco abuse.  Problem list overviews that were updated at today's visit: No problems updated. No current outpatient medications on file prior to visit.   No current facility-administered medications on file prior to visit.   Medications Discontinued During This Encounter  Medication Reason   apixaban (ELIQUIS) 5 MG TABS  tablet    Esomeprazole Magnesium (NEXIUM PO) Reorder   nitroGLYCERIN (NITROSTAT) 0.4 MG SL tablet Reorder   atorvastatin (LIPITOR) 80 MG tablet Reorder   losartan (COZAAR) 25 MG tablet Reorder   carvedilol (COREG) 12.5 MG tablet Reorder   clopidogrel (PLAVIX) 75 MG tablet Reorder   busPIRone (BUSPAR) 10 MG tablet Reorder   isosorbide mononitrate (IMDUR) 30 MG 24 hr tablet Reorder        Objective    Physical Exam  BP 111/68 (BP Location: Right Arm, Patient Position: Sitting)   Pulse 97   Temp 98.2 F (36.8 C) (Temporal)   Ht 5\' 8"  (1.727 m)   Wt 202 lb 3.2 oz (91.7 kg)   SpO2 99%   BMI 30.74 kg/m  Wt Readings from Last 10 Encounters:  09/26/22 202 lb 3.2 oz (91.7 kg)  04/22/22 202 lb 9.6 oz (91.9 kg)  03/14/22 204 lb 9.6 oz (92.8 kg)  01/31/22 207  lb 3.2 oz (94 kg)  05/04/20 196 lb 9.6 oz (89.2 kg)  11/08/19 197 lb (89.4 kg)  07/06/19 203 lb (92.1 kg)  09/20/18 215 lb (97.5 kg)  01/28/18 208 lb (94.3 kg)  08/12/17 226 lb 6.4 oz (102.7 kg)   Vital signs reviewed.  Nursing notes reviewed. Weight trend reviewed. Abnormalities and Problem-Specific physical exam findings:  truncal adiposity stressed  General Appearance:  No acute distress appreciable.   Well-groomed, healthy-appearing female.  Well proportioned with no abnormal fat distribution.  Good muscle tone. Pulmonary:  Normal work of breathing at rest, no respiratory distress apparent. SpO2: 99 %  Musculoskeletal: All extremities are intact.  Neurological:  Awake, alert, oriented, and engaged.  No obvious focal neurological deficits or cognitive impairments.  Sensorium seems unclouded.   Speech is clear and coherent with logical content. Psychiatric:  Appropriate mood, cooperative demeanor, thoughtful and engaged during the exam  Results            No results found for any visits on 09/26/22.  Appointment on 04/15/2022  Component Date Value   Area-P 1/2 04/15/2022 4.68    S' Lateral 04/15/2022 4.10    Est EF  04/15/2022 45 - 50%   Hospital Outpatient Visit on 03/27/2022  Component Date Value   Right ABI 03/27/2022 0.70    Left ABI 03/27/2022 0.93   Lab on 02/14/2022  Component Date Value   Phosphorus 02/14/2022 3.5    VITD 02/14/2022 14.35 (L)    Color, Urine 02/14/2022 YELLOW    APPearance 02/14/2022 CLEAR    Specific Gravity, Urine 02/14/2022 1.020    pH 02/14/2022 6.0    Total Protein, Urine 02/14/2022 30 (A)    Urine Glucose 02/14/2022 NEGATIVE    Ketones, ur 02/14/2022 NEGATIVE    Bilirubin Urine 02/14/2022 SMALL (A)    Hgb urine dipstick 02/14/2022 NEGATIVE    Urobilinogen, UA 02/14/2022 1.0    Leukocytes,Ua 02/14/2022 NEGATIVE    Nitrite 02/14/2022 NEGATIVE    WBC, UA 02/14/2022 0-2/hpf    RBC / HPF 02/14/2022 0-2/hpf    Squamous Epithelial / HPF 02/14/2022 Rare(0-4/hpf)    Bacteria, UA 02/14/2022 Few(10-50/hpf) (A)    Yeast, UA 02/14/2022 Presence of (A)    Uric Acid, Serum 02/14/2022 6.9    Magnesium 02/14/2022 1.8    Cholesterol 02/14/2022 130    Triglycerides 02/14/2022 114.0    HDL 02/14/2022 45.10    VLDL 02/14/2022 22.8    LDL Cholesterol 02/14/2022 62    Total CHOL/HDL Ratio 02/14/2022 3    NonHDL 02/14/2022 84.62    Sodium 02/14/2022 140    Potassium 02/14/2022 4.9    Chloride 02/14/2022 105    CO2 02/14/2022 26    Glucose, Bld 02/14/2022 103 (H)    BUN 02/14/2022 28 (H)    Creatinine, Ser 02/14/2022 1.55 (H)    Total Bilirubin 02/14/2022 0.7    Alkaline Phosphatase 02/14/2022 103    AST 02/14/2022 14    ALT 02/14/2022 11    Total Protein 02/14/2022 7.2    Albumin 02/14/2022 4.6    GFR 02/14/2022 33.49 (L)    Calcium 02/14/2022 9.4    WBC 02/14/2022 4.4    RBC 02/14/2022 4.14    Platelets 02/14/2022 259.0    Hemoglobin 02/14/2022 11.6 (L)    HCT 02/14/2022 35.2 (L)    MCV 02/14/2022 85.2    MCHC 02/14/2022 32.9    RDW 02/14/2022 15.8 (H)    No image results found.   No results found.  VAS US RENAL ARTERY DUPLEX  Result Date:  03/29/2022 ABDOMINAL VISCERAL Patient Name:  ELYSIA EBEN  Date of Exam:   03/28/2022 Medical Rec #: [id number]     Accession #:    [id number] Date of Birth: [birth year]     Patient Gender: F Patient Age:   75 years Exam Location:  [facility] Procedure:      VAS US RENAL ARTERY DUPLEX Referring Phys: [id number] Christiane Ha J BERRY -------------------------------------------------------------------------------- Indications: Patient has remote history of right renal artery stent placement by              Dr. Alanda Amass. She does have significant hypertension. She denies              abdominal pain. High Risk Factors: Hypertension, hyperlipidemia, past history of smoking, prior                    MI, coronary artery disease. Vascular Interventions: Right renal artery stenting by Dr. Alanda Amass, right CEA                         by Dr. Arbie Cookey, bilateral common iliac stenting and right                         femoropopliteal bypass grafting. Limitations: Air/bowel gas and obesity. Comparison Study: No previous studies available in Epic for comparison. Performing Technologist: Olegario Shearer RVT  Examination Guidelines: A complete evaluation includes B-mode imaging, spectral Doppler, color Doppler, and power Doppler as needed of all accessible portions of each vessel. Bilateral testing is considered an integral part of a complete examination. Limited examinations for reoccurring indications may be performed as noted.  Duplex Findings: +--------------------+--------+--------+------+--------+ Mesenteric          PSV cm/sEDV cm/sPlaqueComments +--------------------+--------+--------+------+--------+ Aorta Prox             54      0                   +--------------------+--------+--------+------+--------+ Aorta Distal           52      0                   +--------------------+--------+--------+------+--------+ Celiac Artery Origin  261                           +--------------------+--------+--------+------+--------+ SMA Origin            314      51                  +--------------------+--------+--------+------+--------+    +------------------+--------+--------+-------+ Right Renal ArteryPSV cm/sEDV cm/sComment +------------------+--------+--------+-------+ Origin              134      43           +------------------+--------+--------+-------+ Proximal            186      50           +------------------+--------+--------+-------+ Mid                 204      60           +------------------+--------+--------+-------+ Distal               37      10           +------------------+--------+--------+-------+ +-----------------+--------+--------+-------+  Left Renal ArteryPSV cm/sEDV cm/sComment +-----------------+--------+--------+-------+ Origin             848     334           +-----------------+--------+--------+-------+ Proximal           485      97           +-----------------+--------+--------+-------+ Mid                229      51           +-----------------+--------+--------+-------+ Distal              48      13           +-----------------+--------+--------+-------+  Technologist observations: Unable to visualize stent struts in the right renal artery. The right renal artery appears patent with 1-59% stenosis. Severely elevated velocities noted in the left renal artery. IVC prox and bilateral renal veins appear patent. +------------+--------+--------+----+-----------+--------+--------+----+ Right KidneyPSV cm/sEDV cm/sRI  Left KidneyPSV cm/sEDV cm/sRI   +------------+--------+--------+----+-----------+--------+--------+----+ Upper Pole  31      9       0.70Upper Pole 14      5       0.61 +------------+--------+--------+----+-----------+--------+--------+----+ Mid         25      9       0.        22      7       0.70  +------------+--------+--------+----+-----------+--------+--------+----+ Lower Pole  20      7       0.64Lower Pole 21      5       0.76 +------------+--------+--------+----+-----------+--------+--------+----+ Hilar       51      13      0.75Hilar      22      7       0.68 +------------+--------+--------+----+-----------+--------+--------+----+ +------------------+-------+------------------+-------+ Right Kidney             Left Kidney               +------------------+-------+------------------+-------+ RAR                      RAR                       +------------------+-------+------------------+-------+ RAR (manual)      3.8    RAR (manual)      15.7    +------------------+-------+------------------+-------+ Cortex                   Cortex                    +------------------+-------+------------------+-------+ Cortex thickness  6.00 mmCorex thickness   7.00 mm +------------------+-------+------------------+-------+ Kidney length (cm)10.53  Kidney length (cm)10.38   +------------------+-------+------------------+-------+  Summary: Renal:  Right: 1-59% stenosis of the right renal artery. RRV flow present.        Normal size right kidney. Normal right Resisitive Index.        Normal cortical thickness of right kidney. Left:  Evidence of a high-grade > 60% stenosis in the left renal        artery. LRV flow present. Normal size of left kidney.        Abnormal left Resisitve Index. Normal cortical thickness of        the left kidney. Mesenteric: 70 to 99% stenosis in  the celiac artery and superior mesenteric artery.  *See table(s) above for measurements and observations.  Suggest Peripheral Vascular Consult. Diagnosing physician: Lance Muss MD  Electronically signed by Lance Muss MD on 03/29/2022 at 10:37:52 PM.    Final    VAS US CAROTID  Result Date: 03/29/2022 Carotid Arterial Duplex Study Patient Name:  MYANNA KELLY  Date of Exam:   03/28/2022  Medical Rec #: [id number]     Accession #:    [id number] Date of Birth: [birth year]     Patient Gender: F Patient Age:   33 years Exam Location:  [facility] Procedure:      VAS US CAROTID Referring Phys: Nanetta Batty --------------------------------------------------------------------------------  Indications:       History of right carotid endarterectomy. Patient denies any                    cerebrovascular symptoms at this time. Risk Factors:      Hypertension, hyperlipidemia, past history of smoking, prior                    MI, coronary artery disease, PAD. Comparison Study:  No previous studies in Epic available for comparison. Performing Technologist: Olegario Shearer RVT  Examination Guidelines: A complete evaluation includes B-mode imaging, spectral Doppler, color Doppler, and power Doppler as needed of all accessible portions of each vessel. Bilateral testing is considered an integral part of a complete examination. Limited examinations for reoccurring indications may be performed as noted.  Right Carotid Findings: +----------+--------+--------+--------+------------------+--------+           PSV cm/sEDV cm/sStenosisPlaque DescriptionComments +----------+--------+--------+--------+------------------+--------+ CCA Prox  50      8                                          +----------+--------+--------+--------+------------------+--------+ CCA Distal53      11      <50%    diffuse and smooth         +----------+--------+--------+--------+------------------+--------+ ICA Prox  107     18                                         +----------+--------+--------+--------+------------------+--------+ ICA Mid   0       0       Occluded                           +----------+--------+--------+--------+------------------+--------+ ICA Distal0       0       Occluded                           +----------+--------+--------+--------+------------------+--------+ ECA       242     31       >50%                               +----------+--------+--------+--------+------------------+--------+ +----------+--------+-------+----------------+-------------------+           PSV cm/sEDV cmsDescribe        Arm Pressure (mmHG) +----------+--------+-------+----------------+-------------------+ Subclavian117     1      Multiphasic, [id number]                 +----------+--------+-------+----------------+-------------------+ +---------+--------+---+--------+--+---------+  VertebralPSV cm/s126EDV cm/s47Antegrade +---------+--------+---+--------+--+---------+  Left Carotid Findings: +----------+--------+--------+--------+------------------+--------+           PSV cm/sEDV cm/sStenosisPlaque DescriptionComments +----------+--------+--------+--------+------------------+--------+ CCA Prox  69      23                                         +----------+--------+--------+--------+------------------+--------+ CCA Mid   87      29      <50%    heterogenous               +----------+--------+--------+--------+------------------+--------+ CCA Distal64      25                                         +----------+--------+--------+--------+------------------+--------+ ICA Prox  80      40      1-39%   heterogenous               +----------+--------+--------+--------+------------------+--------+ ICA Mid   70      32                                tortuous +----------+--------+--------+--------+------------------+--------+ ICA Distal70      31                                tortuous +----------+--------+--------+--------+------------------+--------+ ECA       64      10                                         +----------+--------+--------+--------+------------------+--------+ +----------+--------+--------+----------------+-------------------+           PSV cm/sEDV cm/sDescribe        Arm Pressure (mmHG)  +----------+--------+--------+----------------+-------------------+ Subclavian173     1       Multiphasic, [id number]                 +----------+--------+--------+----------------+-------------------+ +---------+--------+--+--------+--+---------+ VertebralPSV cm/s51EDV cm/s16Antegrade +---------+--------+--+--------+--+---------+   Summary: Right Carotid: Evidence consistent with a total occlusion of the right ICA, s/p                history of endarterectomy. Left Carotid: Velocities in the left ICA are consistent with a 1-39% stenosis. Vertebrals:  Bilateral vertebral arteries demonstrate antegrade flow. Subclavians: Normal flow hemodynamics were seen in bilateral subclavian              arteries. *See table(s) above for measurements and observations. Suggest follow up study in 12 months. Electronically signed by Lance Muss MD on 03/29/2022 at 10:36:12 PM.    Final         Additional Notes:   This encounter employed real-time, collaborative documentation. The patient actively reviewed and updated their medical record on a shared screen, ensuring transparency and facilitating joint problem-solving for the problem list, overview, and plan. This approach promotes accurate, informed care. The treatment plan was discussed and reviewed in detail, including medication safety, potential side effects, and all patient questions. We confirmed understanding and comfort with the plan. Follow-up instructions were established, including contacting the office for any concerns, returning if symptoms worsen, persist, or new symptoms develop,  and precautions for potential emergency department visits. ----------------------------------------------------- Lula Olszewski, MD  09/27/2022 5:18 PM  [facility]:  [phone]

## 2022-09-27 DIAGNOSIS — E559 Vitamin D deficiency, unspecified: Secondary | ICD-10-CM | POA: Insufficient documentation

## 2022-09-27 NOTE — Assessment & Plan Note (Signed)
Controlled, continue(s) with As of 09/27/2022 Current hypertension medications:       Sig   carvedilol (COREG) 12.5 MG tablet TAKE 1 TABLET(12.5 MG) BY MOUTH TWICE DAILY WITH A MEAL   losartan (COZAAR) 25 MG tablet TAKE 1 TABLET(25 MG) BY MOUTH TWICE DAILY

## 2022-09-27 NOTE — Patient Instructions (Signed)
VISIT SUMMARY:  During our visit, we discussed your concerns about your current living situation and its impact on your health, including your cardiovascular disease, depression, and gastroesophageal reflux disease (GERD). We also talked about your medications and the side effects you've been experiencing. We discussed your financial strain and housing insecurity, and I referred you to social work for assistance with long-term care planning and stress management.  YOUR PLAN:  -COLON CANCER SCREENING: We discussed different methods for colon cancer screening. You prefer the Cologuard test, which is a stool-based test that looks for certain DNA changes and blood in your stool that could indicate colon cancer.  -HOUSING AND FINANCIAL STRAIN: You're experiencing stress due to your current living situation and financial strain. I've referred you to social work to help you find affordable assisted living options that are covered by Medicaid.  -DEPRESSION: You're experiencing depression related to your living situation. We discussed the potential use of antidepressants, but you prefer to manage without medication at this time. We'll continue to monitor your mental health.  -ANTICOAGULATION: You've stopped taking Eliquis due to side effects and have resumed taking 81mg  aspirin daily. We'll continue this and monitor for any signs of clotting or bleeding. Aspirin is a blood thinner that can help prevent blood clots.  -GASTROESOPHAGEAL REFLUX DISEASE (GERD): You're managing your GERD with Nexium, despite its interaction with Plavix. You prefer to continue Nexium due to its effectiveness. We'll continue this and monitor for any signs of bleeding due to the interaction with Plavix. GERD is a chronic condition where stomach acid frequently flows back into the tube connecting your mouth and stomach (esophagus).  -MEDICATION REFILLS: We'll renew all your current medications and send them to your preferred  pharmacy.  -CHRONIC KIDNEY DISEASE: You have mild kidney disease. We advised you to stay hydrated to protect your kidneys and will continue to monitor your kidney function. Chronic kidney disease is a condition characterized by a gradual loss of kidney function over time.  INSTRUCTIONS:  You declined a blood draw today and plan to complete it at the next visit. We'll continue to monitor your bronchitis symptoms, which require no treatment at this time, and follow up after the social work consultation. Please remember to stay hydrated and take your medications as prescribed. If you have any concerns or if your symptoms worsen, please contact the office.  It was a pleasure seeing you today! Your health and satisfaction are our top priorities.  Glenetta Hew, MD  Your Providers PCP: Lula Olszewski, MD,  [phone]) Referring Provider: Lula Olszewski, MD,  [phone]) Care Team Provider: [name], MD,  [phone])     NEXT STEPS: [x]  Early Intervention: Schedule sooner appointment, call our on-call services, or go to emergency room if there is any significant Increase in pain or discomfort New or worsening symptoms Sudden or severe changes in your health [x]  Flexible Follow-Up: We recommend a No follow-ups on file. for optimal routine care. This allows for progress monitoring and treatment adjustments. [x]  Preventive Care: Schedule your annual preventive care visit! It's typically covered by insurance and helps identify potential health issues early. [x]  Lab & X-ray Appointments: Incomplete tests scheduled today, or call to schedule. X-rays: [facility] (M-F, 8:30am-noon or 1pm-5pm). [x]  Medical Information Release: Sign a release form at front desk to obtain relevant medical information we don't have.  MAKING THE MOST OF OUR FOCUSED 20 MINUTE APPOINTMENTS: [x]   Clearly state your top concerns at the beginning of  the visit to focus our discussion [x]    If you anticipate you will need more time, please inform the front desk during scheduling - we can book multiple appointments in the same week. [x]   If you have transportation problems- use our convenient video appointments or ask about transportation support. [x]   We can get down to business faster if you use MyChart to update information before the visit and submit non-urgent questions before your visit. Thank you for taking the time to provide details through MyChart.  Let our nurse know and she can import this information into your encounter documents.  Arrival and Wait Times: [x]   Arriving on time ensures that everyone receives prompt attention. [x]   Early morning (8a) and afternoon (1p) appointments tend to have shortest wait times. [x]   Unfortunately, we cannot delay appointments for late arrivals or hold slots during phone calls.  Getting Answers and Following Up [x]   Simple Questions & Concerns: For quick questions or basic follow-up after your visit, reach [place] at [phone] or MyChart messaging. [x]   Complex Concerns: If your concern is more complex, scheduling an appointment might be best. Discuss this with the staff to find the most suitable option. [x]   Lab & Imaging Results: We'll contact you directly if results are abnormal or you don't use MyChart. Most normal results will be on MyChart within 2-3 business days, with a review message from Dr. Jon Billings. Haven't heard back in 2 weeks? Need results sooner? Contact us at [phone]. [x]   Referrals: Our referral coordinator will manage specialist referrals. The specialist's office should contact you within 2 weeks to schedule an appointment. Call us if you haven't heard from them after 2 weeks.  Staying Connected [x]   MyChart: Activate your MyChart for the fastest way to access results and message [place]. See the last page of this paperwork for instructions on how to activate.  Bring to Your Next Appointment [x]   Medications: Please  bring all your medication bottles to your next appointment to ensure we have an accurate record of your prescriptions. [x]   Health Diaries: If you're monitoring any health conditions at home, keeping a diary of your readings can be very helpful for discussions at your next appointment.  Billing [x]   X-ray & Lab Orders: These are billed by separate companies. Contact the invoicing company directly for questions or concerns. [x]   Visit Charges: Discuss any billing inquiries with our administrative services team.  Your Satisfaction Matters [x]   Share Your Experience: We strive for your satisfaction! If you have any complaints, or preferably compliments, please let Dr. Jon Billings know directly or contact our Practice Administrators, Edwena Felty or [employer], by asking at the front desk.   Reviewing Your Records [x]   Review this early draft of your clinical encounter notes below and the final encounter summary tomorrow on MyChart after its been completed.  All orders placed so far are visible here: Anxiety -     busPIRone HCl; TAKE 1 TABLET(10 MG) BY MOUTH THREE TIMES DAILY  Dispense: 180 tablet; Refill: 3  Colon cancer screening -     Cologuard  Housing or economic circumstance -     AMB Referral to Managed Medicaid Care Management  Coronary artery disease involving native coronary artery of native heart without angina pectoris -     Atorvastatin Calcium; TAKE 1 TABLET(80 MG) BY MOUTH DAILY AT 6 PM  Dispense: 90 tablet; Refill: 3 -     Nitroglycerin; Place 1 tablet (0.4 mg total) under the  tongue every 5 (five) minutes x 3 doses as needed for chest pain.  Dispense: 25 tablet; Refill: 3  Chronic diastolic congestive heart failure (HCC) -     Carvedilol; TAKE 1 TABLET(12.5 MG) BY MOUTH TWICE DAILY WITH A MEAL  Dispense: 180 tablet; Refill: 3  Hypertension, unspecified type -     Carvedilol; TAKE 1 TABLET(12.5 MG) BY MOUTH TWICE DAILY WITH A MEAL  Dispense: 180 tablet; Refill: 3 -      Isosorbide Mononitrate ER; Take 1 tablet (30 mg total) by mouth daily. Replaces clonidine  Dispense: 90 tablet; Refill: 3 -     Losartan Potassium; TAKE 1 TABLET(25 MG) BY MOUTH TWICE DAILY  Dispense: 180 tablet; Refill: 3  Stented coronary artery -     Clopidogrel Bisulfate; Take 1 tablet (75 mg total) by mouth daily.  Dispense: 90 tablet; Refill: 3  Gastroesophageal reflux disease without esophagitis -     Esomeprazole Magnesium; Take 1 capsule (40 mg total) by mouth daily at 12 noon.  Dispense: 90 capsule; Refill: 3  Mixed simple and mucopurulent chronic bronchitis (HCC)  Stage 3a chronic kidney disease (HCC)  Medication refill  Stenosis of right carotid artery

## 2022-09-27 NOTE — Assessment & Plan Note (Signed)
Mild kidney disease has been noted. We advised her to stay hydrated to protect her kidneys and will continue to monitor kidney function.

## 2022-09-27 NOTE — Assessment & Plan Note (Signed)
She reports severe GERD managed with Nexium, despite its interaction with Plavix. She prefers to continue Nexium due to its effectiveness. We will continue Nexium and monitor for any signs of bleeding due to the interaction with Plavix.   esomeprazole (NEXIUM) 40 MG capsule, Take 1 capsule (40 mg total) by mouth daily at 12 noon.

## 2022-09-27 NOTE — Assessment & Plan Note (Signed)
She has stopped taking Eliquis due to side effects and have resumed taking 81mg  aspirin daily. We will continue the 81mg  aspirin daily and monitor for any signs of clotting or bleeding.  She also takes Plavix. No signs & symptoms new CVA at this time. I sent eliquis accidentlaly to pharmacy and cancelled later.

## 2022-09-27 NOTE — Assessment & Plan Note (Signed)
Patient will continue with   atorvastatin (LIPITOR) 80 MG tablet, TAKE 1 TABLET(80 MG) BY MOUTH DAILY AT 6 PM   carvedilol (COREG) 12.5 MG tablet, TAKE 1 TABLET(12.5 MG) BY MOUTH TWICE DAILY WITH A MEAL   isosorbide mononitrate (IMDUR) 30 MG 24 hr tablet, Take 1 tablet (30 mg total) by mouth daily. Replaces clonidine   losartan (COZAAR) 25 MG tablet, TAKE 1 TABLET(25 MG) BY MOUTH TWICE DAILY   nitroGLYCERIN (NITROSTAT) 0.4 MG SL tablet, Place 1 tablet (0.4 mg total) under the tongue every 5 (five) minutes x 3 doses as needed for chest pain.   clopidogrel (PLAVIX) 75 MG tablet, Take 1 tablet (75 mg total) by mouth daily.

## 2022-09-27 NOTE — Assessment & Plan Note (Signed)
Euvolemic, patient will continue with     atorvastatin (LIPITOR) 80 MG tablet, TAKE 1 TABLET(80 MG) BY MOUTH DAILY AT 6 PM   carvedilol (COREG) 12.5 MG tablet, TAKE 1 TABLET(12.5 MG) BY MOUTH TWICE DAILY WITH A MEAL   isosorbide mononitrate (IMDUR) 30 MG 24 hr tablet, Take 1 tablet (30 mg total) by mouth daily. Replaces clonidine   losartan (COZAAR) 25 MG tablet, TAKE 1 TABLET(25 MG) BY MOUTH TWICE DAILY   nitroGLYCERIN (NITROSTAT) 0.4 MG SL tablet, Place 1 tablet (0.4 mg total) under the tongue every 5 (five) minutes x 3 doses as needed for chest pain.   clopidogrel (PLAVIX) 75 MG tablet, Take 1 tablet (75 mg total) by mouth daily. She is not follow up with cardiologist as recommended right now, will focus on helping improve stress/anxiety

## 2022-09-27 NOTE — Assessment & Plan Note (Signed)
She reports depression related to their living situation and have discussed the potential use of antidepressants. However, she prefers to manage without medication adjustment at this time. We will monitor her mental health status without making medication changes. Patient will continue with:   busPIRone (BUSPAR) 10 MG tablet, TAKE 1 TABLET(10 MG) BY MOUTH THREE TIMES DAILY

## 2022-09-30 ENCOUNTER — Other Ambulatory Visit (HOSPITAL_COMMUNITY): Payer: Self-pay

## 2022-10-03 ENCOUNTER — Other Ambulatory Visit: Payer: Self-pay

## 2022-10-03 ENCOUNTER — Other Ambulatory Visit (HOSPITAL_COMMUNITY): Payer: Self-pay

## 2022-10-07 ENCOUNTER — Ambulatory Visit: Payer: Medicaid Other | Admitting: Cardiovascular Disease

## 2023-01-22 ENCOUNTER — Other Ambulatory Visit: Payer: Self-pay | Admitting: Internal Medicine

## 2023-01-22 DIAGNOSIS — I10 Essential (primary) hypertension: Secondary | ICD-10-CM

## 2023-01-22 DIAGNOSIS — Z955 Presence of coronary angioplasty implant and graft: Secondary | ICD-10-CM

## 2023-01-23 ENCOUNTER — Other Ambulatory Visit: Payer: Self-pay

## 2023-01-23 ENCOUNTER — Other Ambulatory Visit: Payer: Self-pay | Admitting: Internal Medicine

## 2023-01-23 ENCOUNTER — Other Ambulatory Visit (HOSPITAL_COMMUNITY): Payer: Self-pay

## 2023-01-23 DIAGNOSIS — Z955 Presence of coronary angioplasty implant and graft: Secondary | ICD-10-CM

## 2023-01-23 MED ORDER — CLOPIDOGREL BISULFATE 75 MG PO TABS
75.0000 mg | ORAL_TABLET | Freq: Every day | ORAL | 3 refills | Status: DC
  Filled 2023-01-23: qty 90, 90d supply, fill #0

## 2023-01-27 ENCOUNTER — Encounter (HOSPITAL_COMMUNITY): Payer: Self-pay

## 2023-01-27 ENCOUNTER — Other Ambulatory Visit (HOSPITAL_COMMUNITY): Payer: Self-pay

## 2023-01-27 MED ORDER — ATORVASTATIN CALCIUM 80 MG PO TABS: 80.0000 mg | ORAL_TABLET | Freq: Every day | ORAL | 0 refills | Status: DC

## 2023-02-16 ENCOUNTER — Telehealth: Payer: Self-pay | Admitting: Internal Medicine

## 2023-02-16 NOTE — Telephone Encounter (Signed)
Copied from CRM [id number]. Topic: General - Other >> Feb 16, 2023 12:43 PM [name] wrote: Reason for CRM: Patient is requesting a letter that would exempt her from jury duty moving forward due to her history of heart disease.

## 2023-02-19 NOTE — Telephone Encounter (Signed)
Copied from CRM [id number]. Topic: General - Other >> Feb 19, 2023  8:29 AM Pascal Lux wrote: Reason for CRM: Patient needs a statement from Dr. Jon Billings to exempt her from jury duty with her chronic congestive heart failure. Patient stated she needs this statement mailed to her home by January 28th. Also requesting a call back.

## 2023-02-23 NOTE — Telephone Encounter (Signed)
Patient has been scheduled for a virtual visit on 1/22

## 2023-02-25 ENCOUNTER — Other Ambulatory Visit (HOSPITAL_COMMUNITY): Payer: Self-pay

## 2023-02-25 ENCOUNTER — Telehealth: Payer: Medicaid Other | Admitting: Internal Medicine

## 2023-02-25 DIAGNOSIS — I25810 Atherosclerosis of coronary artery bypass graft(s) without angina pectoris: Secondary | ICD-10-CM | POA: Diagnosis not present

## 2023-02-25 DIAGNOSIS — Z955 Presence of coronary angioplasty implant and graft: Secondary | ICD-10-CM | POA: Diagnosis not present

## 2023-02-25 DIAGNOSIS — J418 Mixed simple and mucopurulent chronic bronchitis: Secondary | ICD-10-CM

## 2023-02-25 DIAGNOSIS — I679 Cerebrovascular disease, unspecified: Secondary | ICD-10-CM | POA: Diagnosis not present

## 2023-02-25 DIAGNOSIS — I739 Peripheral vascular disease, unspecified: Secondary | ICD-10-CM

## 2023-02-25 DIAGNOSIS — N1831 Chronic kidney disease, stage 3a: Secondary | ICD-10-CM

## 2023-02-25 DIAGNOSIS — Z8673 Personal history of transient ischemic attack (TIA), and cerebral infarction without residual deficits: Secondary | ICD-10-CM

## 2023-02-25 DIAGNOSIS — I6521 Occlusion and stenosis of right carotid artery: Secondary | ICD-10-CM

## 2023-02-25 MED ORDER — OZEMPIC (0.25 OR 0.5 MG/DOSE) 2 MG/3ML ~~LOC~~ SOPN
0.5000 mg | PEN_INJECTOR | SUBCUTANEOUS | 3 refills | Status: DC
  Filled 2023-02-25 – 2023-03-02 (×2): qty 3, 28d supply, fill #0

## 2023-02-25 MED ORDER — CLOPIDOGREL BISULFATE 75 MG PO TABS
75.0000 mg | ORAL_TABLET | Freq: Every day | ORAL | 3 refills | Status: AC
  Filled 2023-02-25 – 2023-04-28 (×2): qty 90, 90d supply, fill #0
  Filled 2023-07-28: qty 90, 90d supply, fill #1
  Filled 2023-10-27: qty 90, 90d supply, fill #2
  Filled 2024-01-25: qty 90, 90d supply, fill #3

## 2023-02-25 NOTE — Assessment & Plan Note (Signed)
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 Discussed risks and benefits of amputation, including infection, prolonged recovery, improved mobility, and pain relief. Without intervention, the risk of tissue necrosis increases. Continue current medications and follow up with Dr. Allyson Sabal in March.

## 2023-02-25 NOTE — Patient Instructions (Signed)
VISIT SUMMARY:  During today's visit, we discussed your worsening fatigue and shortness of breath, which have been affecting your daily activities. We reviewed your history of cardiovascular disease, including heart failure, coronary artery disease, atrial flutter, and severe peripheral vascular disease. We also discussed your other medical conditions, such as COPD, chronic kidney disease, and cerebrovascular disease. Your current medications and recent changes were reviewed, and we talked about potential new treatments to help manage your symptoms and improve your overall health.  YOUR PLAN:  -PERIPHERAL VASCULAR DISEASE: Peripheral vascular disease is a condition where the blood vessels outside your heart and brain become narrowed or blocked. We discussed the risks and benefits of amputation due to the total occlusion of the femoral artery graft in your right leg. Without intervention, there is a risk of tissue death. You should continue your current medications and follow up with Dr. Allyson Sabal in March.  -CAROTID ARTERY OCCLUSION: Carotid artery occlusion is the blockage of the carotid arteries, which supply blood to your brain. This increases your risk of stroke. We discussed the importance of monitoring for stroke symptoms and maintaining your cardiovascular health. You should continue your current medications and watch for any signs of stroke.  -CEREBROVASCULAR DISEASE: Cerebrovascular disease refers to conditions that affect the blood vessels and blood supply to the brain. We talked about the potential benefits of weight loss medication to reduce your stroke risk and improve cardiovascular health. You should continue your current medications and consider weight loss medication.  -CONGESTIVE HEART FAILURE: Congestive heart failure is a condition where your heart doesn't pump blood as well as it should. This causes fatigue and reduced stamina. We discussed the potential benefits of weight loss  medication to reduce your cardiovascular risk. You should continue your current medications and consider weight loss medication. An annual wellness visit is also encouraged for additional screenings.  -CHRONIC KIDNEY DISEASE: Chronic kidney disease is a long-term condition where the kidneys do not work as well as they should. We will continue to monitor your renal function and you should follow up with a nephrologist as needed.  -CHRONIC OBSTRUCTIVE PULMONARY DISEASE (COPD): COPD is a chronic inflammatory lung disease that causes obstructed airflow from the lungs. Since you have not smoked since 2021, it is important to continue smoking cessation to prevent exacerbations and improve lung function. We will continue to monitor your respiratory status.  -GENERAL HEALTH MAINTENANCE: Maintaining your general health through regular screenings and lifestyle modifications is important. We discussed the importance of an annual wellness visit for comprehensive health screening. Consider weight loss medication to reduce cardiovascular risk.  INSTRUCTIONS:  Please follow up with Dr. Allyson Sabal in March and schedule your next virtual visit for medication refills.

## 2023-02-25 NOTE — Progress Notes (Signed)
[place]: [phone]   Virtual Medical Office Visit - Video Telemedicine   Patient:  [name] (1950/04/05)  MRN:   [id number]      Date:   02/25/2023  Today's Healthcare Provider: Lula Olszewski, MD   Assessment & Plan  Assessment & Plan   Main reason for visit: Requesting letter for jury duty   Assessment & Plan Stented coronary artery  History of stroke  Cerebrovascular disease Cerebrovascular Disease At risk for further cerebrovascular events due to carotid artery occlusion. Discussed potential benefit of weight loss medication (Ozempic or Wegovy) to reduce stroke risk and improve cardiovascular health. Continue current medications and consider weight loss medication to reduce stroke risk. Stage 3a chronic kidney disease (HCC) Chronic kidney disease with a functioning right renal stent requires monitoring of renal function to prevent further decline. Continue monitoring renal function and follow up with a nephrologist as needed.  Coronary atherosclerosis of autologous vein bypass graft without angina Peripheral Vascular Disease Severe peripheral vascular disease with total occlusion of the femoral artery graft in the right leg significantly limits mobility and stamina. Discussed risks and benefits of amputation, including infection, prolonged recovery, improved mobility, and pain relief. Without intervention, the risk of tissue necrosis increases. Continue current medications and follow up with [name] in March. PAD (peripheral artery disease) (HCC) Peripheral Vascular Disease Severe peripheral vascular disease with total occlusion of the femoral artery graft in the right leg significantly limits mobility and stamina. Discussed risks and benefits of amputation, including infection, prolonged recovery, improved mobility, and pain relief. Without intervention, the risk of tissue necrosis increases. Continue current medications and  follow up with [name] in March. Stenosis of right carotid artery Total occlusion of the right carotid artery increases the risk of embolic events and cerebrovascular accidents. Discussed stroke risk and the importance of monitoring for symptoms such as sudden weakness, confusion, or difficulty speaking. Current management focuses on preventing embolic events and maintaining cardiovascular health. Continue current medications and monitor for signs of stroke.  Mixed simple and mucopurulent chronic bronchitis (HCC) COPD, with no smoking since 2021, requires continued smoking cessation to prevent exacerbations and improve lung function. Continue smoking cessation and monitor respiratory status.    General Health Maintenance Encouraged to maintain general health through regular screenings and lifestyle modifications. Discussed the importance of an annual wellness visit for comprehensive health screening. Encourage an annual wellness visit for additional screenings and consider weight loss medication to reduce cardiovascular risk.  Follow-up Follow up with [name] in March and schedule the next virtual visit for medication refills.     History of stroke -     Ozempic (0.25 or 0.5 MG/DOSE); Inject 0.5 mg into the skin once a week.  Dispense: 3 mL; Refill: 3  Stented coronary artery -     Clopidogrel Bisulfate; Take 1 tablet (75 mg total) by mouth daily.  Dispense: 90 tablet; Refill: 3    Completed letter of release from jury duty see AVS    Subjective:   Chief Complaint / Reason for Visit:  Requesting letter for jury duty   73 y.o. female  has a past medical history of ASCVD (arteriosclerotic cardiovascular disease), Cerebrovascular disease, Coronary artery disease, Diverticulitis, DJD (degenerative joint disease) of cervical spine, GERD (gastroesophageal reflux disease), H. pylori infection, Hepatitis, Hypertension, Migraine headache, PVD (peripheral vascular disease) (HCC), Shingles  (11/21/2015), Stented coronary artery (01/31/2022), and Tobacco abuse.    History of Present Illness The  patient, with a history of cardiovascular disease, including heart failure, history of heart attacks, coronary artery disease, atrial flutter, and severe peripheral vascular disease, presents with worsening fatigue and shortness of breath. She reports that she tires out easily and has difficulty walking more than 200 feet without needing to stop due to breathlessness. She also notes that her condition has worsened since her last visit in August of the previous year.  The patient has total blockages in her legs and carotid arteries, which severely limit her mobility and stamina. She also has a history of a TIA. She reports that her right leg femoral artery graft is totally occluded again, and the right carotid artery is also totally occluded. However, her right renal stent is still keeping her kidney open.  She is currently on multiple cardiac medications, including carvedilol, losartan, Plavix, Imdur, and atorvastatin. She reports that her blood pressure has been well-controlled since the medication switch. She no longer takes her 81 mg aspirin. She also reports that she has given up smoking since 2021.  The patient also has other serious medical conditions, including COPD, chronic kidney disease, and cerebrovascular disease. She reports significant fatigue with minimal exertion and difficulty maintaining focus for extended periods of time due to reduced blood flow. She requires frequent rest periods and looks forward to going to bed around six o'clock at night due to fatigue.  Past Medical History - Chronic heart failure - History of heart attacks - Coronary artery disease - Atrial flutter - Severe peripheral vascular disease with total blockages in legs and carotid arteries - COPD - Chronic kidney disease - Cerebrovascular disease (stroke) - Brain aneurysm  Reviewed chart data: has  Hyperlipidemia; History of inferior wall myocardial infarction; Cerebrovascular disease; Peripheral vascular disease of lower extremity (HCC); GASTROESOPHAGEAL REFLUX DISEASE; DIVERTICULAR DISEASE; Hypertensive disorder; Coronary atherosclerosis of autologous vein bypass graft; Chronic diastolic congestive heart failure (HCC); COPD (chronic obstructive pulmonary disease) (HCC); Obesity (BMI 30.0-34.9); CKD (chronic kidney disease) stage 3, GFR 30-59 ml/min (HCC); Insomnia; Stented coronary artery; History of smoking 10-25 pack years; Anxiety; Atrial flutter (HCC); Carotid artery stenosis; Left atrial dilatation; and Vitamin D deficiency on their problem list. Semaglutide(0.25 or 0.5MG /DOS), apixaban, atorvastatin, carvedilol, clopidogrel, isosorbide mononitrate, losartan, and nitroGLYCERIN Medications - Lipitor (atorvastatin) - Coreg (carvedilol) - Plavix (clopidogrel) - Imdur (isosorbide) - Losartan Social History - Quit smoking in 2021     Objective:  Physical Exam         Limited by video visit  General Appearance:  Well Developed, Well Nourished, No Acute Distress by Limited Video Assessment Pulmonary:  No Respiratory Distress Apparent. Normal Work of Breathing.   Neurological:  Awake, Alert. No Obvious Focal Neurological Deficits or Cognitive Impairments.  Sensorium Seems Unclouded. Psychiatric:  Appropriate Mood, Pleasant Demeanor, Calm, Articulate, Good Mood  Results Reviewed:         ------------------------------------------------------ Attestation:  Today's Healthcare Provider Misty Olszewski, MD was located at office at [facility] at [facility] [address] [place] [address]. The patient was located at home. Today's Telemedicine visit was conducted via Video after consent for telemedicine was obtained:  Video connection was never lost but it was brief interrupted All video encounter participant identities and locations confirmed visually and  verbally.  Signed: Lula Olszewski, MD 02/25/2023 5:45 PM

## 2023-02-25 NOTE — Assessment & Plan Note (Signed)
Chronic kidney disease with a functioning right renal stent requires monitoring of renal function to prevent further decline. Continue monitoring renal function and follow up with a nephrologist as needed.

## 2023-02-25 NOTE — Assessment & Plan Note (Signed)
COPD, with no smoking since 2021, requires continued smoking cessation to prevent exacerbations and improve lung function. Continue smoking cessation and monitor respiratory status.

## 2023-02-25 NOTE — Assessment & Plan Note (Signed)
Cerebrovascular Disease At risk for further cerebrovascular events due to carotid artery occlusion. Discussed potential benefit of weight loss medication (Ozempic or Wegovy) to reduce stroke risk and improve cardiovascular health. Continue current medications and consider weight loss medication to reduce stroke risk.

## 2023-02-25 NOTE — Assessment & Plan Note (Signed)
Total occlusion of the right carotid artery increases the risk of embolic events and cerebrovascular accidents. Discussed stroke risk and the importance of monitoring for symptoms such as sudden weakness, confusion, or difficulty speaking. Current management focuses on preventing embolic events and maintaining cardiovascular health. Continue current medications and monitor for signs of stroke.

## 2023-02-26 ENCOUNTER — Other Ambulatory Visit: Payer: Self-pay

## 2023-02-26 ENCOUNTER — Other Ambulatory Visit (HOSPITAL_COMMUNITY): Payer: Self-pay

## 2023-02-27 ENCOUNTER — Ambulatory Visit: Payer: Medicaid Other | Admitting: Internal Medicine

## 2023-03-02 ENCOUNTER — Other Ambulatory Visit (HOSPITAL_COMMUNITY): Payer: Self-pay

## 2023-03-02 ENCOUNTER — Telehealth: Payer: Self-pay | Admitting: Pharmacist

## 2023-03-02 ENCOUNTER — Other Ambulatory Visit: Payer: Self-pay | Admitting: Internal Medicine

## 2023-03-02 DIAGNOSIS — Z8673 Personal history of transient ischemic attack (TIA), and cerebral infarction without residual deficits: Secondary | ICD-10-CM

## 2023-03-02 NOTE — Telephone Encounter (Signed)
Ozempic is approved exclusively as an adjunct to diet and exercise to improve glycemic control in adults with type 2 diabetes mellitus. A review of [name]'s medical chart reveals no documented diagnosis of type 2 diabetes or an elevated A1C. Therefore, she does not currently meet the criteria for prior authorization of this medication. If clinically appropriate, alternative options such as [name], or [name] may be considered for this patient.  Thank you, [name], PharmD Clinical Pharmacist  [facility]  Direct Dial: [phone]

## 2023-03-03 MED ORDER — OZEMPIC (0.25 OR 0.5 MG/DOSE) 2 MG/3ML ~~LOC~~ SOPN
0.2500 mg | PEN_INJECTOR | SUBCUTANEOUS | 3 refills | Status: AC
  Filled 2023-03-03: qty 3, 28d supply, fill #0

## 2023-03-03 NOTE — Telephone Encounter (Signed)
Sent my chart message advising patient to reach out to her insurance company to find out what similar medications they will cover. She has a lot of other serious diagnoses that should get her approved.

## 2023-03-04 ENCOUNTER — Other Ambulatory Visit: Payer: Self-pay

## 2023-03-05 ENCOUNTER — Other Ambulatory Visit (HOSPITAL_COMMUNITY): Payer: Self-pay

## 2023-03-05 ENCOUNTER — Other Ambulatory Visit: Payer: Self-pay

## 2023-03-09 ENCOUNTER — Other Ambulatory Visit: Payer: Self-pay | Admitting: Internal Medicine

## 2023-03-09 DIAGNOSIS — I10 Essential (primary) hypertension: Secondary | ICD-10-CM

## 2023-03-09 DIAGNOSIS — I5032 Chronic diastolic (congestive) heart failure: Secondary | ICD-10-CM

## 2023-03-10 ENCOUNTER — Other Ambulatory Visit (HOSPITAL_COMMUNITY): Payer: Self-pay

## 2023-03-10 NOTE — Telephone Encounter (Signed)
A year supply was sent in on 09/26/22.

## 2023-04-13 ENCOUNTER — Other Ambulatory Visit: Payer: Self-pay | Admitting: Cardiovascular Disease

## 2023-04-13 DIAGNOSIS — I739 Peripheral vascular disease, unspecified: Secondary | ICD-10-CM

## 2023-04-13 DIAGNOSIS — E782 Mixed hyperlipidemia: Secondary | ICD-10-CM

## 2023-04-13 DIAGNOSIS — I10 Essential (primary) hypertension: Secondary | ICD-10-CM

## 2023-04-13 DIAGNOSIS — I701 Atherosclerosis of renal artery: Secondary | ICD-10-CM

## 2023-04-13 DIAGNOSIS — I479 Paroxysmal tachycardia, unspecified: Secondary | ICD-10-CM

## 2023-04-14 ENCOUNTER — Other Ambulatory Visit (HOSPITAL_COMMUNITY): Payer: Self-pay

## 2023-04-22 ENCOUNTER — Ambulatory Visit (HOSPITAL_COMMUNITY)

## 2023-04-28 ENCOUNTER — Other Ambulatory Visit: Payer: Self-pay | Admitting: Internal Medicine

## 2023-04-28 ENCOUNTER — Other Ambulatory Visit (HOSPITAL_COMMUNITY): Payer: Self-pay

## 2023-04-28 DIAGNOSIS — I10 Essential (primary) hypertension: Secondary | ICD-10-CM

## 2023-04-28 MED ORDER — LOSARTAN POTASSIUM 25 MG PO TABS
25.0000 mg | ORAL_TABLET | Freq: Two times a day (BID) | ORAL | 3 refills | Status: AC
  Filled 2023-04-28: qty 180, 90d supply, fill #0
  Filled 2023-07-28: qty 180, 90d supply, fill #1

## 2023-04-28 MED ORDER — ISOSORBIDE MONONITRATE ER 30 MG PO TB24
30.0000 mg | ORAL_TABLET | Freq: Every day | ORAL | 3 refills | Status: AC
  Filled 2023-04-28: qty 90, 90d supply, fill #0
  Filled 2023-07-28: qty 90, 90d supply, fill #1
  Filled 2023-10-27: qty 90, 90d supply, fill #2

## 2023-04-29 ENCOUNTER — Other Ambulatory Visit (HOSPITAL_COMMUNITY): Payer: Self-pay

## 2023-04-29 ENCOUNTER — Other Ambulatory Visit: Payer: Self-pay

## 2023-07-28 ENCOUNTER — Other Ambulatory Visit (HOSPITAL_COMMUNITY): Payer: Self-pay

## 2023-10-27 ENCOUNTER — Other Ambulatory Visit: Payer: Self-pay | Admitting: Internal Medicine

## 2023-10-27 ENCOUNTER — Other Ambulatory Visit (HOSPITAL_COMMUNITY): Payer: Self-pay

## 2023-10-27 ENCOUNTER — Other Ambulatory Visit: Payer: Self-pay

## 2023-10-27 DIAGNOSIS — I5032 Chronic diastolic (congestive) heart failure: Secondary | ICD-10-CM

## 2023-10-27 DIAGNOSIS — I10 Essential (primary) hypertension: Secondary | ICD-10-CM

## 2023-10-27 DIAGNOSIS — I2510 Atherosclerotic heart disease of native coronary artery without angina pectoris: Secondary | ICD-10-CM

## 2023-10-27 MED ORDER — ATORVASTATIN CALCIUM 80 MG PO TABS
ORAL_TABLET | ORAL | 0 refills | Status: DC
  Filled 2023-10-27: qty 30, 30d supply, fill #0

## 2023-10-27 MED ORDER — CARVEDILOL 12.5 MG PO TABS
ORAL_TABLET | ORAL | 0 refills | Status: DC
  Filled 2023-10-27: qty 60, 30d supply, fill #0

## 2023-10-27 MED FILL — Losartan Potassium Tab 25 MG: ORAL | 90 days supply | Qty: 180 | Fill #0 | Status: AC

## 2024-01-05 ENCOUNTER — Other Ambulatory Visit (HOSPITAL_COMMUNITY): Payer: Self-pay

## 2024-01-05 ENCOUNTER — Other Ambulatory Visit: Payer: Self-pay | Admitting: Internal Medicine

## 2024-01-05 ENCOUNTER — Other Ambulatory Visit: Payer: Self-pay

## 2024-01-05 DIAGNOSIS — I5032 Chronic diastolic (congestive) heart failure: Secondary | ICD-10-CM

## 2024-01-05 DIAGNOSIS — I2510 Atherosclerotic heart disease of native coronary artery without angina pectoris: Secondary | ICD-10-CM

## 2024-01-05 DIAGNOSIS — I10 Essential (primary) hypertension: Secondary | ICD-10-CM

## 2024-01-05 MED ORDER — ATORVASTATIN CALCIUM 80 MG PO TABS
80.0000 mg | ORAL_TABLET | Freq: Every day | ORAL | 0 refills | Status: AC
  Filled 2024-01-05: qty 30, 30d supply, fill #0

## 2024-01-05 MED ORDER — CARVEDILOL 12.5 MG PO TABS
12.5000 mg | ORAL_TABLET | Freq: Two times a day (BID) | ORAL | 0 refills | Status: AC
  Filled 2024-01-05: qty 60, 30d supply, fill #0

## 2024-01-06 ENCOUNTER — Other Ambulatory Visit (HOSPITAL_COMMUNITY): Payer: Self-pay

## 2024-01-06 ENCOUNTER — Encounter (HOSPITAL_COMMUNITY): Payer: Self-pay

## 2024-01-25 ENCOUNTER — Other Ambulatory Visit (HOSPITAL_COMMUNITY): Payer: Self-pay
